# Patient Record
Sex: Female | Born: 1990 | Race: Black or African American | Hispanic: No | Marital: Single | Smoking: Never smoker
Health system: Southern US, Community
[De-identification: ages and names within clinical notes are randomized; demographics above are authoritative.]

## PROBLEM LIST (undated history)

## (undated) ENCOUNTER — Inpatient Hospital Stay (HOSPITAL_COMMUNITY): Payer: Self-pay

## (undated) DIAGNOSIS — I10 Essential (primary) hypertension: Secondary | ICD-10-CM

## (undated) HISTORY — DX: Essential (primary) hypertension: I10

---
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
## 2012-05-27 ENCOUNTER — Encounter (HOSPITAL_COMMUNITY): Payer: Self-pay

## 2012-05-27 ENCOUNTER — Emergency Department (INDEPENDENT_AMBULATORY_CARE_PROVIDER_SITE_OTHER)
Admission: EM | Admit: 2012-05-27 | Discharge: 2012-05-27 | Disposition: A | Discharge status: Home / Self Care | Payer: Medicaid Other | Source: Home / Self Care | Attending: Family Medicine | Admitting: Family Medicine

## 2012-05-27 DIAGNOSIS — S8000XA Contusion of unspecified knee, initial encounter: Secondary | ICD-10-CM

## 2012-05-27 DIAGNOSIS — S8001XA Contusion of right knee, initial encounter: Secondary | ICD-10-CM

## 2012-05-27 MED ORDER — IBUPROFEN 600 MG PO TABS: 600.0000 mg | ORAL_TABLET | Freq: Three times a day (TID) | ORAL | Status: DC

## 2012-05-27 NOTE — ED Provider Notes (Signed)
History     CSN: [id number]  Arrival date & time 05/27/12  1224   First MD Initiated Contact with Patient 05/27/12 1230      Chief Complaint  Patient presents with  . Knee Injury    (Consider location/radiation/quality/duration/timing/severity/associated sxs/prior treatment) HPI Comments: 21 year old female with history of asthma. Here complaining of right knee pain. Patient described as she hit her right knee when her car door closed while she still had her leg outside, incident occured yesterday. No skin cuts. No bruising. Pain only with touching the tender area and with extreme flexion. Denies knee pain with walking or putting weight on her right lower extremity.   Past Medical History  Diagnosis Date  . Asthma   . Allergic rhinitis     History reviewed. No pertinent past surgical history.  Family History  Problem Relation Age of Onset  . Asthma Mother   . Allergic rhinitis Mother     History  Substance Use Topics  . Smoking status: Never Smoker   . Smokeless tobacco: Not on file  . Alcohol Use: No    OB History    Grav Para Term Preterm Abortions TAB SAB Ect Mult Living                  Review of Systems  Constitutional:       10 systems reviewed and  pertinent negative and positive symptoms are as per HPI.     Musculoskeletal: Positive for joint swelling, arthralgias and gait problem.       As per HPI  Skin: Negative for color change and wound.  Neurological: Negative for weakness and numbness.  All other systems reviewed and are negative.    Allergies  Amoxicillin  Home Medications   Current Outpatient Rx  Name Route Sig Dispense Refill  . ALBUTEROL SULFATE HFA 108 (90 BASE) MCG/ACT IN AERS Inhalation Inhale 2 puffs into the lungs every 6 (six) hours as needed.      . ALBUTEROL SULFATE (2.5 MG/3ML) 0.083% IN NEBU Nebulization Take 2.5 mg by nebulization every 6 (six) hours as needed.      . BUDESONIDE-FORMOTEROL FUMARATE 160-4.5 MCG/ACT IN AERO  Inhalation Inhale 2 puffs into the lungs 2 (two) times daily. 1 Inhaler 5  . CETIRIZINE HCL 10 MG PO TABS Oral Take 10 mg by mouth daily.    Marland Kitchen MONTELUKAST SODIUM 10 MG PO TABS Oral Take 10 mg by mouth at bedtime.      . IBUPROFEN 600 MG PO TABS Oral Take 1 tablet (600 mg total) by mouth 3 (three) times daily. 21 tablet 0    BP 128/68  Pulse 76  Temp 97.9 F (36.6 C) (Oral)  Resp 16  SpO2 100%  LMP 05/25/2012  Physical Exam  Nursing note and vitals reviewed. Constitutional: She is oriented to person, place, and time. She appears well-developed and well-nourished. No distress.  HENT:  Head: Normocephalic and atraumatic.  Cardiovascular: Regular rhythm.   Pulmonary/Chest: Breath sounds normal.  Musculoskeletal:       Right knee: no obvious deformity. No bruising. No erythema or obvious swelling. There is reported tenderness to palpation in lateral area. Patella is well aligned with no crepitus or tenderness to palpation. FROM but reported pain with active flexion more than extension. No hematomas or fluctuations. No effusion. Weight bearing.  Entire right lower extremity is neurovascularly intact.  Neurological: She is alert and oriented to person, place, and time.  Skin:  No skin abrasions, hematomas, ecchymosis or lacerations.     ED Course  Procedures (including critical care time)  Labs Reviewed - No data to display No results found.   1. Contusion of right knee       MDM  No hematoma or effusion on exam. Treated symptomatically with a knee sleeve and ibuprofen. Asked to use ice today. Return if persistent or worsening symptoms despite following treatment for 5-7 days.        Sharin Grave, MD 05/30/12 0110

## 2012-05-27 NOTE — ED Notes (Signed)
Patient states that a door closed on her right knee yesterday.

## 2012-06-23 ENCOUNTER — Encounter (HOSPITAL_COMMUNITY): Payer: Self-pay | Admitting: *Deleted

## 2012-06-23 ENCOUNTER — Emergency Department (INDEPENDENT_AMBULATORY_CARE_PROVIDER_SITE_OTHER)
Admission: EM | Admit: 2012-06-23 | Discharge: 2012-06-23 | Disposition: A | Discharge status: Home / Self Care | Payer: Medicaid Other | Source: Home / Self Care | Attending: Family Medicine | Admitting: Family Medicine

## 2012-06-23 DIAGNOSIS — J40 Bronchitis, not specified as acute or chronic: Secondary | ICD-10-CM

## 2012-06-23 DIAGNOSIS — R05 Cough: Secondary | ICD-10-CM

## 2012-06-23 MED ORDER — ALBUTEROL SULFATE HFA 108 (90 BASE) MCG/ACT IN AERS: 2.0000 | INHALATION_SPRAY | Freq: Four times a day (QID) | RESPIRATORY_TRACT | Status: DC | PRN

## 2012-06-23 MED ORDER — PREDNISONE 20 MG PO TABS
ORAL_TABLET | ORAL | Status: AC
  Filled 2012-06-23: qty 3

## 2012-06-23 MED ORDER — ALBUTEROL SULFATE (5 MG/ML) 0.5% IN NEBU
2.5000 mg | INHALATION_SOLUTION | Freq: Once | RESPIRATORY_TRACT | Status: AC
  Administered 2012-06-23: 2.5 mg via RESPIRATORY_TRACT

## 2012-06-23 MED ORDER — GUAIFENESIN-CODEINE 100-10 MG/5ML PO SYRP: 5.0000 mL | ORAL_SOLUTION | Freq: Three times a day (TID) | ORAL | Status: DC | PRN

## 2012-06-23 MED ORDER — IPRATROPIUM BROMIDE 0.02 % IN SOLN
0.5000 mg | Freq: Once | RESPIRATORY_TRACT | Status: AC
  Administered 2012-06-23: 0.5 mg via RESPIRATORY_TRACT

## 2012-06-23 MED ORDER — METHYLPREDNISOLONE 4 MG PO KIT: PACK | ORAL | Status: DC

## 2012-06-23 MED ORDER — ALBUTEROL SULFATE (5 MG/ML) 0.5% IN NEBU
INHALATION_SOLUTION | RESPIRATORY_TRACT | Status: AC
  Filled 2012-06-23: qty 1

## 2012-06-23 MED ORDER — CETIRIZINE HCL 10 MG PO TABS: 10.0000 mg | ORAL_TABLET | Freq: Every day | ORAL | Status: DC

## 2012-06-23 MED ORDER — PREDNISONE 20 MG PO TABS
60.0000 mg | ORAL_TABLET | Freq: Every day | ORAL | Status: DC
  Administered 2012-06-23: 60 mg via ORAL

## 2012-06-23 NOTE — ED Provider Notes (Signed)
History     CSN: [id number]  Arrival date & time 06/23/12  4098   First MD Initiated Contact with Patient 06/23/12 1118      Chief Complaint  Patient presents with  . Cough  . URI    (Consider location/radiation/quality/duration/timing/severity/associated sxs/prior treatment) Patient is a 21 y.o. female presenting with cough and URI. The history is provided by the patient.  Cough This is a recurrent problem. The current episode started 2 days ago. The problem occurs hourly. The problem has been gradually worsening. The cough is productive of sputum. There has been no fever. Associated symptoms include chest pain, shortness of breath and wheezing. Pertinent negatives include no chills, no sweats, no rhinorrhea, no sore throat and no myalgias. Treatments tried: proventil, qvar, cough syrup. The treatment provided no relief. Risk factors: none. She is not a smoker. Her past medical history is significant for bronchitis, emphysema and asthma. Her past medical history does not include pneumonia, bronchiectasis or COPD.  URI The primary symptoms include cough and wheezing. Primary symptoms do not include sore throat or myalgias.  The patient's medical history is significant for asthma. The patient's medical history does not include COPD.  The illness is not associated with chills or rhinorrhea.    Past Medical History  Diagnosis Date  . Asthma   . Allergic rhinitis     History reviewed. No pertinent past surgical history.  Family History  Problem Relation Age of Onset  . Asthma Mother   . Allergic rhinitis Mother     History  Substance Use Topics  . Smoking status: Never Smoker   . Smokeless tobacco: Not on file  . Alcohol Use: No    OB History    Grav Para Term Preterm Abortions TAB SAB Ect Mult Living                  Review of Systems  Constitutional: Negative for chills.  HENT: Negative for sore throat and rhinorrhea.   Respiratory: Positive for cough, shortness  of breath and wheezing.   Cardiovascular: Positive for chest pain.  Musculoskeletal: Negative for myalgias.  All other systems reviewed and are negative.    Allergies  Amoxicillin  Home Medications   Current Outpatient Rx  Name Route Sig Dispense Refill  . ALBUTEROL SULFATE HFA 108 (90 BASE) MCG/ACT IN AERS Inhalation Inhale 2 puffs into the lungs every 6 (six) hours as needed. 1 Inhaler 2  . BUDESONIDE-FORMOTEROL FUMARATE 160-4.5 MCG/ACT IN AERO Inhalation Inhale 2 puffs into the lungs 2 (two) times daily. 1 Inhaler 5  . CETIRIZINE HCL 10 MG PO TABS Oral Take 1 tablet (10 mg total) by mouth daily. 30 tablet 3  . GUAIFENESIN-CODEINE 100-10 MG/5ML PO SYRP Oral Take 5 mLs by mouth 3 (three) times daily as needed for cough. 120 mL 0  . IBUPROFEN 600 MG PO TABS Oral Take 1 tablet (600 mg total) by mouth 3 (three) times daily. 21 tablet 0  . METHYLPREDNISOLONE 4 MG PO KIT  Take as directed 21 tablet 0  . MONTELUKAST SODIUM 10 MG PO TABS Oral Take 10 mg by mouth at bedtime.        BP 119/73  Pulse 109  Temp 97.8 F (36.6 C) (Oral)  Resp 20  SpO2 93%  LMP 05/25/2012  Physical Exam  Nursing note and vitals reviewed. Constitutional: She is oriented to person, place, and time. Vital signs are normal. She appears well-developed and well-nourished. She is active and cooperative.  HENT:  Head: Normocephalic.  Right Ear: External ear normal.  Left Ear: External ear normal.  Nose: Rhinorrhea present.  Mouth/Throat: Uvula is midline. Posterior oropharyngeal erythema present. No oropharyngeal exudate or posterior oropharyngeal edema.  Eyes: Conjunctivae normal are normal. Pupils are equal, round, and reactive to light. No scleral icterus.  Neck: Trachea normal. Neck supple.  Cardiovascular: Normal rate and regular rhythm.   Pulmonary/Chest: Effort normal. She has wheezes. She has no rhonchi.  Neurological: She is alert and oriented to person, place, and time. No cranial nerve deficit or  sensory deficit.  Skin: Skin is warm and dry.  Psychiatric: She has a normal mood and affect. Her speech is normal and behavior is normal. Judgment and thought content normal. Cognition and memory are normal.    ED Course  Procedures (including critical care time)  Labs Reviewed - No data to display No results found.   1. Bronchitis   2. Cough       MDM  Albuterol/atrovent neb and prednisone in office, medication as prescribed.  Follow up with your pulmonologist as needed.        Johnsie Kindred, NP 06/23/12 1214

## 2012-06-23 NOTE — ED Notes (Signed)
Pt is here with complaints of cough with clear sputum, congestions and chest tightness.  Pt has history of asthma.

## 2012-06-24 NOTE — ED Provider Notes (Signed)
Medical screening examination/treatment/procedure(s) were performed by resident physician or non-physician practitioner and as supervising physician I was immediately available for consultation/collaboration.   KINDL,JAMES DOUGLAS MD.    James D Kindl, MD 06/24/12 2106 

## 2012-07-14 DIAGNOSIS — F411 Generalized anxiety disorder: Secondary | ICD-10-CM | POA: Diagnosis present

## 2014-06-13 ENCOUNTER — Emergency Department (HOSPITAL_COMMUNITY)
Admission: EM | Admit: 2014-06-13 | Discharge: 2014-06-14 | Disposition: A | Discharge status: Home / Self Care | Payer: Medicaid Other | Attending: Emergency Medicine | Admitting: Emergency Medicine

## 2014-06-13 ENCOUNTER — Encounter (HOSPITAL_COMMUNITY): Payer: Self-pay | Admitting: Emergency Medicine

## 2014-06-13 DIAGNOSIS — R1031 Right lower quadrant pain: Secondary | ICD-10-CM

## 2014-06-13 DIAGNOSIS — Z79899 Other long term (current) drug therapy: Secondary | ICD-10-CM | POA: Insufficient documentation

## 2014-06-13 DIAGNOSIS — J45909 Unspecified asthma, uncomplicated: Secondary | ICD-10-CM | POA: Insufficient documentation

## 2014-06-13 DIAGNOSIS — Z3202 Encounter for pregnancy test, result negative: Secondary | ICD-10-CM | POA: Diagnosis not present

## 2014-06-13 DIAGNOSIS — Z880 Allergy status to penicillin: Secondary | ICD-10-CM | POA: Diagnosis not present

## 2014-06-13 LAB — URINALYSIS, ROUTINE W REFLEX MICROSCOPIC
Bilirubin Urine: NEGATIVE
Glucose, UA: NEGATIVE mg/dL
Hgb urine dipstick: NEGATIVE
KETONES UR: 15 mg/dL — AB
LEUKOCYTES UA: NEGATIVE
NITRITE: NEGATIVE
PH: 7 (ref 5.0–8.0)
Protein, ur: NEGATIVE mg/dL
SPECIFIC GRAVITY, URINE: 1.012 (ref 1.005–1.030)
UROBILINOGEN UA: 1 mg/dL (ref 0.0–1.0)

## 2014-06-13 LAB — COMPREHENSIVE METABOLIC PANEL
ALBUMIN: 3.9 g/dL (ref 3.5–5.2)
ALT: 10 U/L (ref 0–35)
AST: 15 U/L (ref 0–37)
Alkaline Phosphatase: 94 U/L (ref 39–117)
Anion gap: 12 (ref 5–15)
BUN: 5 mg/dL — ABNORMAL LOW (ref 6–23)
CALCIUM: 9.4 mg/dL (ref 8.4–10.5)
CO2: 24 mEq/L (ref 19–32)
CREATININE: 0.8 mg/dL (ref 0.50–1.10)
Chloride: 101 mEq/L (ref 96–112)
GFR calc Af Amer: >90 mL/min (ref >90)
GFR calc non Af Amer: >90 mL/min (ref >90)
Glucose, Bld: 80 mg/dL (ref 70–99)
Potassium: 4.5 mEq/L (ref 3.7–5.3)
Sodium: 137 mEq/L (ref 137–147)
Total Bilirubin: <0.2 mg/dL — ABNORMAL LOW (ref 0.3–1.2)
Total Protein: 7.7 g/dL (ref 6.0–8.3)

## 2014-06-13 LAB — CBC WITH DIFFERENTIAL/PLATELET
BASOS ABS: 0.1 10*3/uL (ref 0.0–0.1)
BASOS PCT: 1 % (ref 0–1)
EOS ABS: 0.8 10*3/uL — AB (ref 0.0–0.7)
EOS PCT: 11 % — AB (ref 0–5)
HEMATOCRIT: 40.9 % (ref 36.0–46.0)
Hemoglobin: 13.3 g/dL (ref 12.0–15.0)
Lymphocytes Relative: 22 % (ref 12–46)
Lymphs Abs: 1.7 10*3/uL (ref 0.7–4.0)
MCH: 26.4 pg (ref 26.0–34.0)
MCHC: 32.5 g/dL (ref 30.0–36.0)
MCV: 81.2 fL (ref 78.0–100.0)
MONO ABS: 0.9 10*3/uL (ref 0.1–1.0)
Monocytes Relative: 12 % (ref 3–12)
Neutro Abs: 4.2 10*3/uL (ref 1.7–7.7)
Neutrophils Relative %: 54 % (ref 43–77)
Platelets: 332 10*3/uL (ref 150–400)
RBC: 5.04 MIL/uL (ref 3.87–5.11)
RDW: 13.9 % (ref 11.5–15.5)
WBC: 7.7 10*3/uL (ref 4.0–10.5)

## 2014-06-13 LAB — PREGNANCY, URINE: PREG TEST UR: NEGATIVE

## 2014-06-13 MED ORDER — SODIUM CHLORIDE 0.9 % IV BOLUS (SEPSIS)
1000.0000 mL | Freq: Once | INTRAVENOUS | Status: AC
  Administered 2014-06-14: 1000 mL via INTRAVENOUS

## 2014-06-13 MED ORDER — MORPHINE SULFATE 4 MG/ML IJ SOLN
4.0000 mg | Freq: Once | INTRAMUSCULAR | Status: AC
  Administered 2014-06-14: 4 mg via INTRAVENOUS
  Filled 2014-06-13: qty 1

## 2014-06-13 NOTE — ED Provider Notes (Signed)
CSN: [id number]     Arrival date & time 06/13/14  1903 History   First MD Initiated Contact with Patient 06/13/14 2337     Chief Complaint  Patient presents with  . Abdominal Pain     (Consider location/radiation/quality/duration/timing/severity/associated sxs/prior Treatment) HPI Comments: PT comes in with cc of abd pain. No hx of similar pain. Abd pain started this AM, and is located in the lower quadrants and radiating to the right back. Nausea earlier, none now, no emesis, fevers, chills. + cough, and her pain is worse with cough. Cough x 1 week, dry. No vaginal discharge, no vaginal bleeding, no uti like sx, no STD hx and no risk factors, no hx of fibroids or cyst.  Patient is a 23 y.o. female presenting with abdominal pain. The history is provided by the patient.  Abdominal Pain Associated symptoms: no chest pain, no dysuria, no nausea, no shortness of breath, no vaginal discharge and no vomiting     Past Medical History  Diagnosis Date  . Asthma   . Allergic rhinitis    History reviewed. No pertinent past surgical history. Family History  Problem Relation Age of Onset  . Asthma Mother   . Allergic rhinitis Mother    History  Substance Use Topics  . Smoking status: Never Smoker   . Smokeless tobacco: Not on file  . Alcohol Use: No   OB History   Grav Para Term Preterm Abortions TAB SAB Ect Mult Living                 Review of Systems  Constitutional: Negative for activity change.  Respiratory: Negative for shortness of breath.   Cardiovascular: Negative for chest pain.  Gastrointestinal: Positive for abdominal pain. Negative for nausea and vomiting.  Genitourinary: Negative for dysuria, frequency and vaginal discharge.  Musculoskeletal: Negative for neck pain.  Neurological: Negative for headaches.  All other systems reviewed and are negative.     Allergies  Amoxicillin  Home Medications   Prior to Admission medications   Medication Sig Start Date End  Date Taking? Authorizing Provider  albuterol (PROVENTIL HFA;VENTOLIN HFA) 108 (90 BASE) MCG/ACT inhaler Inhale 2 puffs into the lungs every 6 (six) hours as needed for shortness of breath. 06/23/12  Yes Johnsie Kindredarmen L Chatten, NP  cetirizine (ZYRTEC) 10 MG tablet Take 1 tablet (10 mg total) by mouth daily. 06/23/12  Yes Johnsie Kindredarmen L Chatten, NP  ibuprofen (ADVIL,MOTRIN) 600 MG tablet Take 1 tablet (600 mg total) by mouth 3 (three) times daily. 05/27/12  Yes Adlih Moreno-Coll, MD  montelukast (SINGULAIR) 10 MG tablet Take 10 mg by mouth daily.    Yes Historical Provider, MD  ibuprofen (ADVIL,MOTRIN) 600 MG tablet Take 1 tablet (600 mg total) by mouth every 6 (six) hours as needed. 06/14/14   Sharise Lippy Rhunette CroftNanavati, MD   BP 121/47  Pulse 94  Temp(Src) 98.1 F (36.7 C)  Resp 20  SpO2 100%  LMP 05/31/2014 Physical Exam  Nursing note and vitals reviewed. Constitutional: She is oriented to person, place, and time. She appears well-developed and well-nourished.  HENT:  Head: Normocephalic and atraumatic.  Eyes: Conjunctivae and EOM are normal. Pupils are equal, round, and reactive to light.  Neck: Normal range of motion. Neck supple.  Cardiovascular: Normal rate, regular rhythm, normal heart sounds and intact distal pulses.   No murmur heard. Pulmonary/Chest: Effort normal. No respiratory distress. She has no wheezes.  Abdominal: Soft. Bowel sounds are normal. She exhibits no distension. There is tenderness. There  is guarding. There is no rebound.  RLQ tenderness, over the mcburneys  Genitourinary: Vagina normal and uterus normal.  External exam - normal, no lesions Speculum exam: Pt has some white discharge, no blood Bimanual exam: Patient has no CMT, no adnexal tenderness or fullness and cervical os is closed  Neurological: She is alert and oriented to person, place, and time.  Skin: Skin is warm and dry.    ED Course  Procedures (including critical care time) Labs Review Labs Reviewed  WET PREP,  GENITAL - Abnormal; Notable for the following:    WBC, Wet Prep HPF POC FEW (*)    All other components within normal limits  CBC WITH DIFFERENTIAL - Abnormal; Notable for the following:    Eosinophils Relative 11 (*)    Eosinophils Absolute 0.8 (*)    All other components within normal limits  COMPREHENSIVE METABOLIC PANEL - Abnormal; Notable for the following:    BUN 5 (*)    Total Bilirubin <0.2 (*)    All other components within normal limits  URINALYSIS, ROUTINE W REFLEX MICROSCOPIC - Abnormal; Notable for the following:    Ketones, ur 15 (*)    All other components within normal limits  GC/CHLAMYDIA PROBE AMP  PREGNANCY, URINE    Imaging Review Dg Chest 2 View  06/14/2014   CLINICAL DATA:  Right lower abdominal pain. Chest pain. Initial encounter  EXAM: CHEST  2 VIEW  COMPARISON:  08/16/2011  FINDINGS: Normal heart size and mediastinal contours. No acute infiltrate or edema. No effusion or pneumothorax. No acute osseous findings.  IMPRESSION: No active cardiopulmonary disease.   Electronically Signed   By: Tiburcio Pea M.D.   On: 06/14/2014 02:33   Ct Abdomen Pelvis W Contrast  06/14/2014   CLINICAL DATA:  Lower abdominal and right low back pain. Onset this morning with nausea. Initial encounter.  EXAM: CT ABDOMEN AND PELVIS WITH CONTRAST  TECHNIQUE: Multidetector CT imaging of the abdomen and pelvis was performed using the standard protocol following bolus administration of intravenous contrast.  CONTRAST:  OMNIPAQUE IOHEXOL 300 MG/ML  SOLN  COMPARISON:  None.  FINDINGS: Normal hepatic contour. No discrete hepatic lesions. Normal appearance of the gallbladder. No radiopaque gallstones. No intra or extrahepatic biliary duct dilatation. No ascites.  There is symmetric enhancement of the bilateral kidneys. No discrete renal lesions. No definite renal stones on this postcontrast examination. The urinary obstruction perinephric stranding. Normal appearance of the bilateral adrenal  glands, pancreas and spleen.  Moderate colonic stool burden without evidence of enteric obstruction. The bowel is normal in course and caliber without wall thickening. Normal appearance of the appendix. No pneumoperitoneum, pneumatosis or portal venous gas.  Normal caliber of the abdominal aorta. The major branch vessels of the abdominal aorta appear widely patent on this non CTA examination. No retroperitoneal, mesenteric, pelvic or inguinal lymphadenopathy.  There is a minimal amount of presumably physiologic fluid within the endometrial canal. No discrete adnexal lesions. There is a trace amount of physiologic fluid within the pelvic cul-de-sac.  Normal appearance of the urinary bladder given degree distention  Limited visualization of lower thorax is degraded secondary to patient respiratory artifact. No discrete focal airspace opacities. No pleural effusion. Normal heart size. No pericardial effusion.  No acute or aggressive osseous abnormalities. Regional soft tissues appear normal.  IMPRESSION: 1. No explanation for patient's lower abdominal and right-sided back pain. Specifically, no evidence enteric or urinary obstruction. Normal appearance of the appendix. 2. Small amount of presumably physiologic  fluid within the endometrial canal and pelvic cul-de-sac. No discrete adnexal lesions.   Electronically Signed   By: Simonne ComeJohn  Watts M.D.   On: 06/14/2014 01:14     EKG Interpretation None      MDM   Final diagnoses:  RLQ abdominal pain    Pt comes in with abd pain. Pain started earlier today, and on exam pt has focal RLQ tenderness. She has a normal pelvic exam. No hx of STD, and no risk factors for the same. Pain is moving to the flank region, no uti like sx, no hx of renal stones or pyelo. UA is normal. CT abd ordered for the focal RLQ pain - and is neg. No large ovarian cyst seen. Not sure what the pain is from, and asked pt to see Gyne for further evaluation.   Derwood KaplanAnkit Nicholle Falzon, MD 06/14/14  [phone]

## 2014-06-13 NOTE — ED Notes (Signed)
Pt. reports low abdominal pain and right low back pain onset this morning with nausea , denies emesis or diarrhea , no fever or chills.

## 2014-06-14 ENCOUNTER — Emergency Department (HOSPITAL_COMMUNITY): Payer: Medicaid Other

## 2014-06-14 ENCOUNTER — Encounter (HOSPITAL_COMMUNITY): Payer: Self-pay | Admitting: Radiology

## 2014-06-14 LAB — WET PREP, GENITAL
Clue Cells Wet Prep HPF POC: NONE SEEN
Trich, Wet Prep: NONE SEEN
YEAST WET PREP: NONE SEEN

## 2014-06-14 MED ORDER — IBUPROFEN 600 MG PO TABS
600.0000 mg | ORAL_TABLET | Freq: Four times a day (QID) | ORAL | Status: DC | PRN
Start: 2014-06-14 — End: 2014-11-26

## 2014-06-14 MED ORDER — IOHEXOL 300 MG/ML  SOLN
100.0000 mL | Freq: Once | INTRAMUSCULAR | Status: AC | PRN
  Administered 2014-06-14: 100 mL via INTRAVENOUS

## 2014-06-14 NOTE — ED Notes (Signed)
MD at bedside. 

## 2014-06-14 NOTE — Discharge Instructions (Signed)
We saw you in the ER for the abdominal pain. All the results in the ER are normal, labs and imaging. We are not sure what is causing your symptoms. The workup in the ER is not complete, and is limited to screening for life threatening and emergent conditions only, so please see a Gynecologist for this pain.   Abdominal Pain, Women Abdominal (stomach, pelvic, or belly) pain can be caused by many things. It is important to tell your doctor:  The location of the pain.  Does it come and go or is it present all the time?  Are there things that start the pain (eating certain foods, exercise)?  Are there other symptoms associated with the pain (fever, nausea, vomiting, diarrhea)? All of this is helpful to know when trying to find the cause of the pain. CAUSES   Stomach: virus or bacteria infection, or ulcer.  Intestine: appendicitis (inflamed appendix), regional ileitis (Crohn's disease), ulcerative colitis (inflamed colon), irritable bowel syndrome, diverticulitis (inflamed diverticulum of the colon), or cancer of the stomach or intestine.  Gallbladder disease or stones in the gallbladder.  Kidney disease, kidney stones, or infection.  Pancreas infection or cancer.  Fibromyalgia (pain disorder).  Diseases of the female organs:  Uterus: fibroid (non-cancerous) tumors or infection.  Fallopian tubes: infection or tubal pregnancy.  Ovary: cysts or tumors.  Pelvic adhesions (scar tissue).  Endometriosis (uterus lining tissue growing in the pelvis and on the pelvic organs).  Pelvic congestion syndrome (female organs filling up with blood just before the menstrual period).  Pain with the menstrual period.  Pain with ovulation (producing an egg).  Pain with an IUD (intrauterine device, birth control) in the uterus.  Cancer of the female organs.  Functional pain (pain not caused by a disease, may improve without treatment).  Psychological pain.  Depression. DIAGNOSIS  Your  doctor will decide the seriousness of your pain by doing an examination.  Blood tests.  X-rays.  Ultrasound.  CT scan (computed tomography, special type of X-ray).  MRI (magnetic resonance imaging).  Cultures, for infection.  Barium enema (dye inserted in the large intestine, to better view it with X-rays).  Colonoscopy (looking in intestine with a lighted tube).  Laparoscopy (minor surgery, looking in abdomen with a lighted tube).  Major abdominal exploratory surgery (looking in abdomen with a large incision). TREATMENT  The treatment will depend on the cause of the pain.   Many cases can be observed and treated at home.  Over-the-counter medicines recommended by your caregiver.  Prescription medicine.  Antibiotics, for infection.  Birth control pills, for painful periods or for ovulation pain.  Hormone treatment, for endometriosis.  Nerve blocking injections.  Physical therapy.  Antidepressants.  Counseling with a psychologist or psychiatrist.  Minor or major surgery. HOME CARE INSTRUCTIONS   Do not take laxatives, unless directed by your caregiver.  Take over-the-counter pain medicine only if ordered by your caregiver. Do not take aspirin because it can cause an upset stomach or bleeding.  Try a clear liquid diet (broth or water) as ordered by your caregiver. Slowly move to a bland diet, as tolerated, if the pain is related to the stomach or intestine.  Have a thermometer and take your temperature several times a day, and record it.  Bed rest and sleep, if it helps the pain.  Avoid sexual intercourse, if it causes pain.  Avoid stressful situations.  Keep your follow-up appointments and tests, as your caregiver orders.  If the pain does not go  away with medicine or surgery, you may try:  Acupuncture.  Relaxation exercises (yoga, meditation).  Group therapy.  Counseling. SEEK MEDICAL CARE IF:   You notice certain foods cause stomach  pain.  Your home care treatment is not helping your pain.  You need stronger pain medicine.  You want your IUD removed.  You feel faint or lightheaded.  You develop nausea and vomiting.  You develop a rash.  You are having side effects or an allergy to your medicine. SEEK IMMEDIATE MEDICAL CARE IF:   Your pain does not go away or gets worse.  You have a fever.  Your pain is felt only in portions of the abdomen. The right side could possibly be appendicitis. The left lower portion of the abdomen could be colitis or diverticulitis.  You are passing blood in your stools (bright red or black tarry stools, with or without vomiting).  You have blood in your urine.  You develop chills, with or without a fever.  You pass out. MAKE SURE YOU:   Understand these instructions.  Will watch your condition.  Will get help right away if you are not doing well or get worse. Document Released: 06/16/2007 Document Revised: 01/03/2014 Document Reviewed: 07/06/2009 [facility] Patient Information 2015 [employer], [place]. This information is not intended to replace advice given to you by your health care provider. Make sure you discuss any questions you have with your health care provider.  Flank Pain Flank pain refers to pain that is located on the side of the body between the upper abdomen and the back. The pain may occur over a short period of time (acute) or may be long-term or reoccurring (chronic). It may be mild or severe. Flank pain can be caused by many things. CAUSES  Some of the more common causes of flank pain include:  Muscle strains.   Muscle spasms.   A disease of your spine (vertebral disk disease).   A lung infection (pneumonia).   Fluid around your lungs (pulmonary edema).   A kidney infection.   Kidney stones.   A very painful skin rash caused by the chickenpox virus (shingles).   Gallbladder disease.  HOME CARE INSTRUCTIONS  Home care will depend on the  cause of your pain. In general,  Rest as directed by your caregiver.  Drink enough fluids to keep your urine clear or pale yellow.  Only take over-the-counter or prescription medicines as directed by your caregiver. Some medicines may help relieve the pain.  Tell your caregiver about any changes in your pain.  Follow up with your caregiver as directed. SEEK IMMEDIATE MEDICAL CARE IF:   Your pain is not controlled with medicine.   You have new or worsening symptoms.  Your pain increases.   You have abdominal pain.   You have shortness of breath.   You have persistent nausea or vomiting.   You have swelling in your abdomen.   You feel faint or pass out.   You have blood in your urine.  You have a fever or persistent symptoms for more than 2-3 days.  You have a fever and your symptoms suddenly get worse. MAKE SURE YOU:   Understand these instructions.  Will watch your condition.  Will get help right away if you are not doing well or get worse. Document Released: 10/10/2005 Document Revised: 05/13/2012 Document Reviewed: 04/02/2012 [facility] Patient Information 2015 [employer], [place]. This information is not intended to replace advice given to you by your health care provider. Make  sure you discuss any questions you have with your health care provider.

## 2014-06-15 LAB — GC/CHLAMYDIA PROBE AMP
CT Probe RNA: POSITIVE — AB
GC PROBE AMP APTIMA: NEGATIVE

## 2014-06-20 ENCOUNTER — Telehealth (HOSPITAL_COMMUNITY): Payer: Self-pay

## 2014-07-13 ENCOUNTER — Encounter (HOSPITAL_COMMUNITY): Payer: Self-pay | Admitting: *Deleted

## 2014-07-13 ENCOUNTER — Emergency Department (INDEPENDENT_AMBULATORY_CARE_PROVIDER_SITE_OTHER)
Admission: EM | Admit: 2014-07-13 | Discharge: 2014-07-13 | Disposition: A | Discharge status: Home / Self Care | Payer: Medicaid Other | Source: Home / Self Care | Attending: Emergency Medicine | Admitting: Emergency Medicine

## 2014-07-13 DIAGNOSIS — J069 Acute upper respiratory infection, unspecified: Secondary | ICD-10-CM | POA: Diagnosis not present

## 2014-07-13 DIAGNOSIS — B9789 Other viral agents as the cause of diseases classified elsewhere: Principal | ICD-10-CM

## 2014-07-13 MED ORDER — IPRATROPIUM-ALBUTEROL 0.5-2.5 (3) MG/3ML IN SOLN
RESPIRATORY_TRACT | Status: AC
Start: 2014-07-13 — End: 2014-07-13
  Filled 2014-07-13: qty 3

## 2014-07-13 MED ORDER — CETIRIZINE HCL 10 MG PO TABS: 10.0000 mg | ORAL_TABLET | Freq: Every day | ORAL | Status: DC

## 2014-07-13 MED ORDER — IPRATROPIUM-ALBUTEROL 0.5-2.5 (3) MG/3ML IN SOLN
3.0000 mL | Freq: Once | RESPIRATORY_TRACT | Status: AC
  Administered 2014-07-13: 3 mL via RESPIRATORY_TRACT

## 2014-07-13 MED ORDER — BENZONATATE 100 MG PO CAPS: 100.0000 mg | ORAL_CAPSULE | Freq: Two times a day (BID) | ORAL | Status: DC | PRN

## 2014-07-13 NOTE — Discharge Instructions (Signed)
You have a viral cold. Restart the Zyrtec daily. Use your albuterol as needed for cough and wheezing. Take Mucinex twice a day to help with the congestion. Take Tessalon every 12 hours as needed for cough. You can also take honey for the cough.  Follow-up with your regular doctor if your symptoms do not improve in 1 week.

## 2014-07-13 NOTE — ED Notes (Signed)
Pt  Has  Symptoms  Of  Cough   /  Congested       Hurts to  Breath       Since  Last  Week     -  Symptoms  Getting  Worse        Pt has  Asthma    Pt is wheezing        Symptoms  Not releived by her  meds

## 2014-07-13 NOTE — ED Provider Notes (Signed)
CSN: [id number]     Arrival date & time 07/13/14  1305 History   First MD Initiated Contact with Patient 07/13/14 1319     Chief Complaint  Patient presents with  . Cough   (Consider location/radiation/quality/duration/timing/severity/associated sxs/prior Treatment) HPI She is a 23 year old woman here for evaluation of cough and congestion. Her symptoms started about a week ago and include nasal congestion, rhinorrhea, cough, headache. She denies any fevers or chills. No nausea or vomiting. No ear pain. She does have a history of asthma and reports some chest tightness and wheezing. She last used her albuterol this morning at 11 AM. She states she just completed a course of prednisone.  She has been taking her Singulair. She states she ran out of Zyrtec about 2 weeks ago. She works in a nursing home.  Past Medical History  Diagnosis Date  . Asthma   . Allergic rhinitis    History reviewed. No pertinent past surgical history. Family History  Problem Relation Age of Onset  . Asthma Mother   . Allergic rhinitis Mother    History  Substance Use Topics  . Smoking status: Never Smoker   . Smokeless tobacco: Not on file  . Alcohol Use: No   OB History    No data available     Review of Systems  Constitutional: Negative for fever and chills.  HENT: Positive for congestion and rhinorrhea. Negative for sinus pressure, sore throat and trouble swallowing.   Respiratory: Positive for cough, chest tightness and wheezing. Negative for shortness of breath.   Cardiovascular: Negative for chest pain.  Gastrointestinal: Negative for nausea, vomiting and diarrhea.  Musculoskeletal: Negative for myalgias.  Neurological: Positive for headaches.    Allergies  Amoxicillin  Home Medications   Prior to Admission medications   Medication Sig Start Date End Date Taking? Authorizing Provider  albuterol (PROVENTIL HFA;VENTOLIN HFA) 108 (90 BASE) MCG/ACT inhaler Inhale 2 puffs into the lungs every  6 (six) hours as needed for shortness of breath. 06/23/12   Johnsie Kindredarmen L Chatten, NP  cetirizine (ZYRTEC) 10 MG tablet Take 1 tablet (10 mg total) by mouth daily. 06/23/12   Johnsie Kindredarmen L Chatten, NP  ibuprofen (ADVIL,MOTRIN) 600 MG tablet Take 1 tablet (600 mg total) by mouth 3 (three) times daily. 05/27/12   Adlih Moreno-Coll, MD  ibuprofen (ADVIL,MOTRIN) 600 MG tablet Take 1 tablet (600 mg total) by mouth every 6 (six) hours as needed. 06/14/14   Derwood KaplanAnkit Nanavati, MD  montelukast (SINGULAIR) 10 MG tablet Take 10 mg by mouth daily.     Historical Provider, MD   BP 148/80 mmHg  Pulse 110  Temp(Src) 98.6 F (37 C) (Oral)  Resp 24  SpO2 93%  LMP 06/22/2014 Physical Exam  Constitutional: She is oriented to person, place, and time. She appears well-developed and well-nourished. No distress.  HENT:  Head: Normocephalic and atraumatic.  Right Ear: Tympanic membrane and external ear normal.  Left Ear: Tympanic membrane and external ear normal.  Nose: Mucosal edema present. No rhinorrhea. Right sinus exhibits no maxillary sinus tenderness and no frontal sinus tenderness. Left sinus exhibits no maxillary sinus tenderness and no frontal sinus tenderness.  Mouth/Throat: Oropharynx is clear and moist. No oropharyngeal exudate.  Mild cobblestoning  Neck: Neck supple.  Cardiovascular: Normal rate, regular rhythm and normal heart sounds.   No murmur heard. Pulmonary/Chest: Effort normal. No respiratory distress (mild, scattered). She has wheezes. She has no rales.  Lymphadenopathy:    She has no cervical adenopathy.  Neurological: She  is alert and oriented to person, place, and time.    ED Course  Procedures (including critical care time) Labs Review Labs Reviewed - No data to display  Imaging Review No results found.   MDM  No diagnosis found. Will give DuoNeb here.  Lungs are clear and she reports feeling much better after the breathing treatment.  Symptomatic treatment with Zyrtec and  Mucinex. Tessalon and honey as needed for cough. Follow-up as needed.    Charm RingsErin J Claiborne Stroble, MD 07/13/14 [phone]

## 2014-11-26 ENCOUNTER — Emergency Department (INDEPENDENT_AMBULATORY_CARE_PROVIDER_SITE_OTHER)
Admission: EM | Admit: 2014-11-26 | Discharge: 2014-11-26 | Disposition: A | Discharge status: Home / Self Care | Payer: Medicaid Other | Source: Home / Self Care | Attending: Emergency Medicine | Admitting: Emergency Medicine

## 2014-11-26 ENCOUNTER — Encounter (HOSPITAL_COMMUNITY): Payer: Self-pay | Admitting: Emergency Medicine

## 2014-11-26 DIAGNOSIS — T148 Other injury of unspecified body region: Secondary | ICD-10-CM

## 2014-11-26 DIAGNOSIS — T148XXA Other injury of unspecified body region, initial encounter: Secondary | ICD-10-CM

## 2014-11-26 NOTE — Discharge Instructions (Signed)

## 2014-11-26 NOTE — ED Notes (Signed)
C/o  Right flank pain.  Pain is more severe with coughing.  Denies injury or any urinary symptoms.  No otc meds tried.

## 2014-11-26 NOTE — ED Provider Notes (Signed)
CSN: [id number]     Arrival date & time 11/26/14  1521 History   First MD Initiated Contact with Patient 11/26/14 1620     Chief Complaint  Patient presents with  . Flank Pain   (Consider location/radiation/quality/duration/timing/severity/associated sxs/prior Treatment) HPI Comments: 24 year old female complaining of "right side pain "for over 3 months. She states the pain is elicited with coughing. Patient points to the right iliac crest as the source of pain. She denies shortness of breath or increasing cough.  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 Denies any known single event or trauma.    Past Medical History  Diagnosis Date  . Asthma   . Allergic rhinitis    History reviewed. No pertinent past surgical history. Family History  Problem Relation Age of Onset  . Asthma Mother   . Allergic rhinitis Mother    History  Substance Use Topics  . Smoking status: Never Smoker   . Smokeless tobacco: Not on file  . Alcohol Use: No   OB History    No data available     Review of Systems  Constitutional: Negative for fever, activity change and fatigue.  Respiratory: Negative.   Cardiovascular: Negative.   Gastrointestinal: Negative.   Musculoskeletal: Negative for back pain and neck pain.       As per history of present illness  Skin: Negative.   Neurological: Negative.     Allergies  Amoxicillin  Home Medications   Prior to Admission medications   Medication Sig Start Date End Date Taking? Authorizing Provider  albuterol (PROVENTIL HFA;VENTOLIN HFA) 108 (90 BASE) MCG/ACT inhaler Inhale 2 puffs into the lungs every 6 (six) hours as needed for shortness of breath. 06/23/12  Yes Johnsie Kindredarmen L Chatten, NP  benzonatate (TESSALON) 100 MG capsule Take 1 capsule (100 mg total) by mouth 2 (two) times daily as needed for cough. 07/13/14   Charm RingsErin J Honig, MD  cetirizine (ZYRTEC) 10 MG tablet Take 1 tablet (10 mg total) by mouth  daily. 07/13/14   Charm RingsErin J Honig, MD  ibuprofen (ADVIL,MOTRIN) 600 MG tablet Take 1 tablet (600 mg total) by mouth 3 (three) times daily. 05/27/12   Adlih Moreno-Coll, MD  ibuprofen (ADVIL,MOTRIN) 600 MG tablet Take 1 tablet (600 mg total) by mouth every 6 (six) hours as needed. 06/14/14   Derwood KaplanAnkit Nanavati, MD  montelukast (SINGULAIR) 10 MG tablet Take 10 mg by mouth daily.     Historical Provider, MD   BP 130/78 mmHg  Pulse 95  Temp(Src) 98 F (36.7 C) (Oral)  Resp 16  SpO2 100%  LMP 11/20/2014 Physical Exam  Constitutional: She is oriented to person, place, and time. She appears well-developed and well-nourished. No distress.  Neck: Neck supple.  Cardiovascular: Normal rate.   Pulmonary/Chest: Effort normal. No respiratory distress. She has no wheezes. She has no rales.  Abdominal: Soft. She exhibits no distension and no mass. There is no tenderness. There is no rebound and no guarding.  Musculoskeletal: She exhibits tenderness. She exhibits no edema.  There is tenderness directly over the anterior iliac crest. The patient states that palpation of this area reproduces the same pain for which she presents. Straight leg raises do not produce pain. Passive internal rotation of the hip reproduces pain. There is minor tenderness approximately one centimeters. Her to this area of the iliac crest. Distal neurovascular motor sensory is intact. Full range of motion of hip.  Neurological: She is alert and  oriented to person, place, and time.  Skin: Skin is warm and dry.  Psychiatric: She has a normal mood and affect.  Nursing note and vitals reviewed.   ED Course  Procedures (including critical care time) Labs Review Labs Reviewed - No data to display  Imaging Review No results found.   MDM   1. Muscle strain    The point of tenderness may represent areas of attachment for the transverse abdominis, iliac as or internal oblique. There is no rib tenderness or pain. Local heat  application Limit activity that exacerbates pain Ibuprofen 600 mg every 6-8 hours when necessary pain Follow-up with your primary care provider.      Hayden Rasmussen, NP 11/26/14 [phone]

## 2014-12-19 ENCOUNTER — Encounter (HOSPITAL_COMMUNITY): Payer: Self-pay | Admitting: Emergency Medicine

## 2014-12-19 ENCOUNTER — Emergency Department (HOSPITAL_COMMUNITY): Payer: No Typology Code available for payment source

## 2014-12-19 DIAGNOSIS — Y998 Other external cause status: Secondary | ICD-10-CM | POA: Diagnosis not present

## 2014-12-19 DIAGNOSIS — Y9389 Activity, other specified: Secondary | ICD-10-CM | POA: Diagnosis not present

## 2014-12-19 DIAGNOSIS — Z79899 Other long term (current) drug therapy: Secondary | ICD-10-CM | POA: Diagnosis not present

## 2014-12-19 DIAGNOSIS — S20212A Contusion of left front wall of thorax, initial encounter: Secondary | ICD-10-CM | POA: Insufficient documentation

## 2014-12-19 DIAGNOSIS — J45909 Unspecified asthma, uncomplicated: Secondary | ICD-10-CM | POA: Diagnosis not present

## 2014-12-19 DIAGNOSIS — S161XXA Strain of muscle, fascia and tendon at neck level, initial encounter: Secondary | ICD-10-CM | POA: Diagnosis not present

## 2014-12-19 DIAGNOSIS — Z880 Allergy status to penicillin: Secondary | ICD-10-CM | POA: Diagnosis not present

## 2014-12-19 DIAGNOSIS — S199XXA Unspecified injury of neck, initial encounter: Secondary | ICD-10-CM | POA: Diagnosis present

## 2014-12-19 DIAGNOSIS — Y92410 Unspecified street and highway as the place of occurrence of the external cause: Secondary | ICD-10-CM | POA: Diagnosis not present
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
## 2014-12-20 ENCOUNTER — Emergency Department (HOSPITAL_COMMUNITY)
Admission: EM | Admit: 2014-12-20 | Discharge: 2014-12-20 | Disposition: A | Discharge status: Home / Self Care | Payer: No Typology Code available for payment source | Attending: Emergency Medicine | Admitting: Emergency Medicine

## 2014-12-20 DIAGNOSIS — S161XXA Strain of muscle, fascia and tendon at neck level, initial encounter: Secondary | ICD-10-CM

## 2014-12-20 DIAGNOSIS — S20212A Contusion of left front wall of thorax, initial encounter: Secondary | ICD-10-CM

## 2014-12-20 MED ORDER — OXYCODONE-ACETAMINOPHEN 5-325 MG PO TABS
1.0000 | ORAL_TABLET | Freq: Once | ORAL | Status: AC
  Administered 2014-12-20: 1 via ORAL
  Filled 2014-12-20: qty 1

## 2014-12-20 MED ORDER — OXYCODONE-ACETAMINOPHEN 5-325 MG PO TABS: 1.0000 | ORAL_TABLET | Freq: Three times a day (TID) | ORAL | Status: DC | PRN

## 2014-12-20 MED ORDER — IBUPROFEN 400 MG PO TABS
600.0000 mg | ORAL_TABLET | Freq: Once | ORAL | Status: AC
  Administered 2014-12-20: 600 mg via ORAL
  Filled 2014-12-20 (×2): qty 1

## 2014-12-20 MED ORDER — IBUPROFEN 600 MG PO TABS: 600.0000 mg | ORAL_TABLET | Freq: Three times a day (TID) | ORAL | Status: DC

## 2014-12-20 MED ORDER — CYCLOBENZAPRINE HCL 5 MG PO TABS: 5.0000 mg | ORAL_TABLET | Freq: Three times a day (TID) | ORAL | Status: DC | PRN
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
## 2014-12-20 NOTE — Discharge Instructions (Signed)
Cervical Sprain °A cervical sprain is an injury in the neck in which the strong, fibrous tissues (ligaments) that connect your neck bones stretch or tear. Cervical sprains can range from mild to severe. Severe cervical sprains can cause the neck vertebrae to be unstable. This can lead to damage of the spinal cord and can result in serious nervous system problems. The amount of time it takes for a cervical sprain to get better depends on the cause and extent of the injury. Most cervical sprains heal in 1 to 3 weeks. °CAUSES  °Severe cervical sprains may be caused by:  °· Contact sport injuries (such as from football, rugby, wrestling, hockey, auto racing, gymnastics, diving, martial arts, or boxing).   °· Motor vehicle collisions.   °· Whiplash injuries. This is an injury from a sudden forward and backward whipping movement of the head and neck.  °· Falls.   °Mild cervical sprains may be caused by:  °· Being in an awkward position, such as while cradling a telephone between your ear and shoulder.   °· Sitting in a chair that does not offer proper support.   °· Working at a poorly designed computer station.   °· Looking up or down for long periods of time.   °SYMPTOMS  °· Pain, soreness, stiffness, or a burning sensation in the front, back, or sides of the neck. This discomfort may develop immediately after the injury or slowly, 24 hours or more after the injury.   °· Pain or tenderness directly in the middle of the back of the neck.   °· Shoulder or upper back pain.   °· Limited ability to move the neck.   °· Headache.   °· Dizziness.   °· Weakness, numbness, or tingling in the hands or arms.   °· Muscle spasms.   °· Difficulty swallowing or chewing.   °· Tenderness and swelling of the neck.   °DIAGNOSIS  °Most of the time your health care provider can diagnose a cervical sprain by taking your history and doing a physical exam. Your health care provider will ask about previous neck injuries and any known neck  problems, such as arthritis in the neck. X-rays may be taken to find out if there are any other problems, such as with the bones of the neck. Other tests, such as a CT scan or MRI, may also be needed.  °TREATMENT  °Treatment depends on the severity of the cervical sprain. Mild sprains can be treated with rest, keeping the neck in place (immobilization), and pain medicines. Severe cervical sprains are immediately immobilized. Further treatment is done to help with pain, muscle spasms, and other symptoms and may include: °· Medicines, such as pain relievers, numbing medicines, or muscle relaxants.   °· Physical therapy. This may involve stretching exercises, strengthening exercises, and posture training. Exercises and improved posture can help stabilize the neck, strengthen muscles, and help stop symptoms from returning.   °HOME CARE INSTRUCTIONS  °· Put ice on the injured area.   °¨ Put ice in a plastic bag.   °¨ Place a towel between your skin and the bag.   °¨ Leave the ice on for 15-20 minutes, 3-4 times a day.   °· If your injury was severe, you may have been given a cervical collar to wear. A cervical collar is a two-piece collar designed to keep your neck from moving while it heals. °¨ Do not remove the collar unless instructed by your health care provider. °¨ If you have long hair, keep it outside of the collar. °¨ Ask your health care provider before making any adjustments to your collar. Minor   adjustments may be required over time to improve comfort and reduce pressure on your chin or on the back of your head.  Ifyou are allowed to remove the collar for cleaning or bathing, follow your health care provider's instructions on how to do so safely.  Keep your collar clean by wiping it with mild soap and water and drying it completely. If the collar you have been given includes removable pads, remove them every 1-2 days and hand wash them with soap and water. Allow them to air dry. They should be completely  dry before you wear them in the collar.  If you are allowed to remove the collar for cleaning and bathing, wash and dry the skin of your neck. Check your skin for irritation or sores. If you see any, tell your health care provider.  Do not drive while wearing the collar.   Only take over-the-counter or prescription medicines for pain, discomfort, or fever as directed by your health care provider.   Keep all follow-up appointments as directed by your health care provider.   Keep all physical therapy appointments as directed by your health care provider.   Make any needed adjustments to your workstation to promote good posture.   Avoid positions and activities that make your symptoms worse.   Warm up and stretch before being active to help prevent problems.  SEEK MEDICAL CARE IF:   Your pain is not controlled with medicine.   You are unable to decrease your pain medicine over time as planned.   Your activity level is not improving as expected.  SEEK IMMEDIATE MEDICAL CARE IF:   You develop any bleeding.  You develop stomach upset.  You have signs of an allergic reaction to your medicine.   Your symptoms get worse.   You develop new, unexplained symptoms.   You have numbness, tingling, weakness, or paralysis in any part of your body.  MAKE SURE YOU:   Understand these instructions.  Will watch your condition.  Will get help right away if you are not doing well or get worse. Document Released: 06/16/2007 Document Revised: 08/24/2013 Document Reviewed: 02/24/2013 [facility] Patient Information 2015 [employer] [place], [place]. This information is not intended to replace advice given to you by your health care provider. Make sure you discuss any questions you have with your health care provider. Your x-ray is normal.  You have a strain of the muscles and ligaments.  You have been given medication for pain muscle relaxation, and anti-inflammatory medicines.  Please take this on  a regular basis as directed even apply heat or cold your preference for the next several days to the areas that are painful

## 2016-04-14 IMAGING — CR DG CHEST 2V
2 series · 2 of 2 positions shown · non-contrast
Comparison: 08/16/2011

CLINICAL DATA: Right lower abdominal pain. Chest pain. Initial
encounter

EXAM:
CHEST  2 VIEW

[w chest pa]
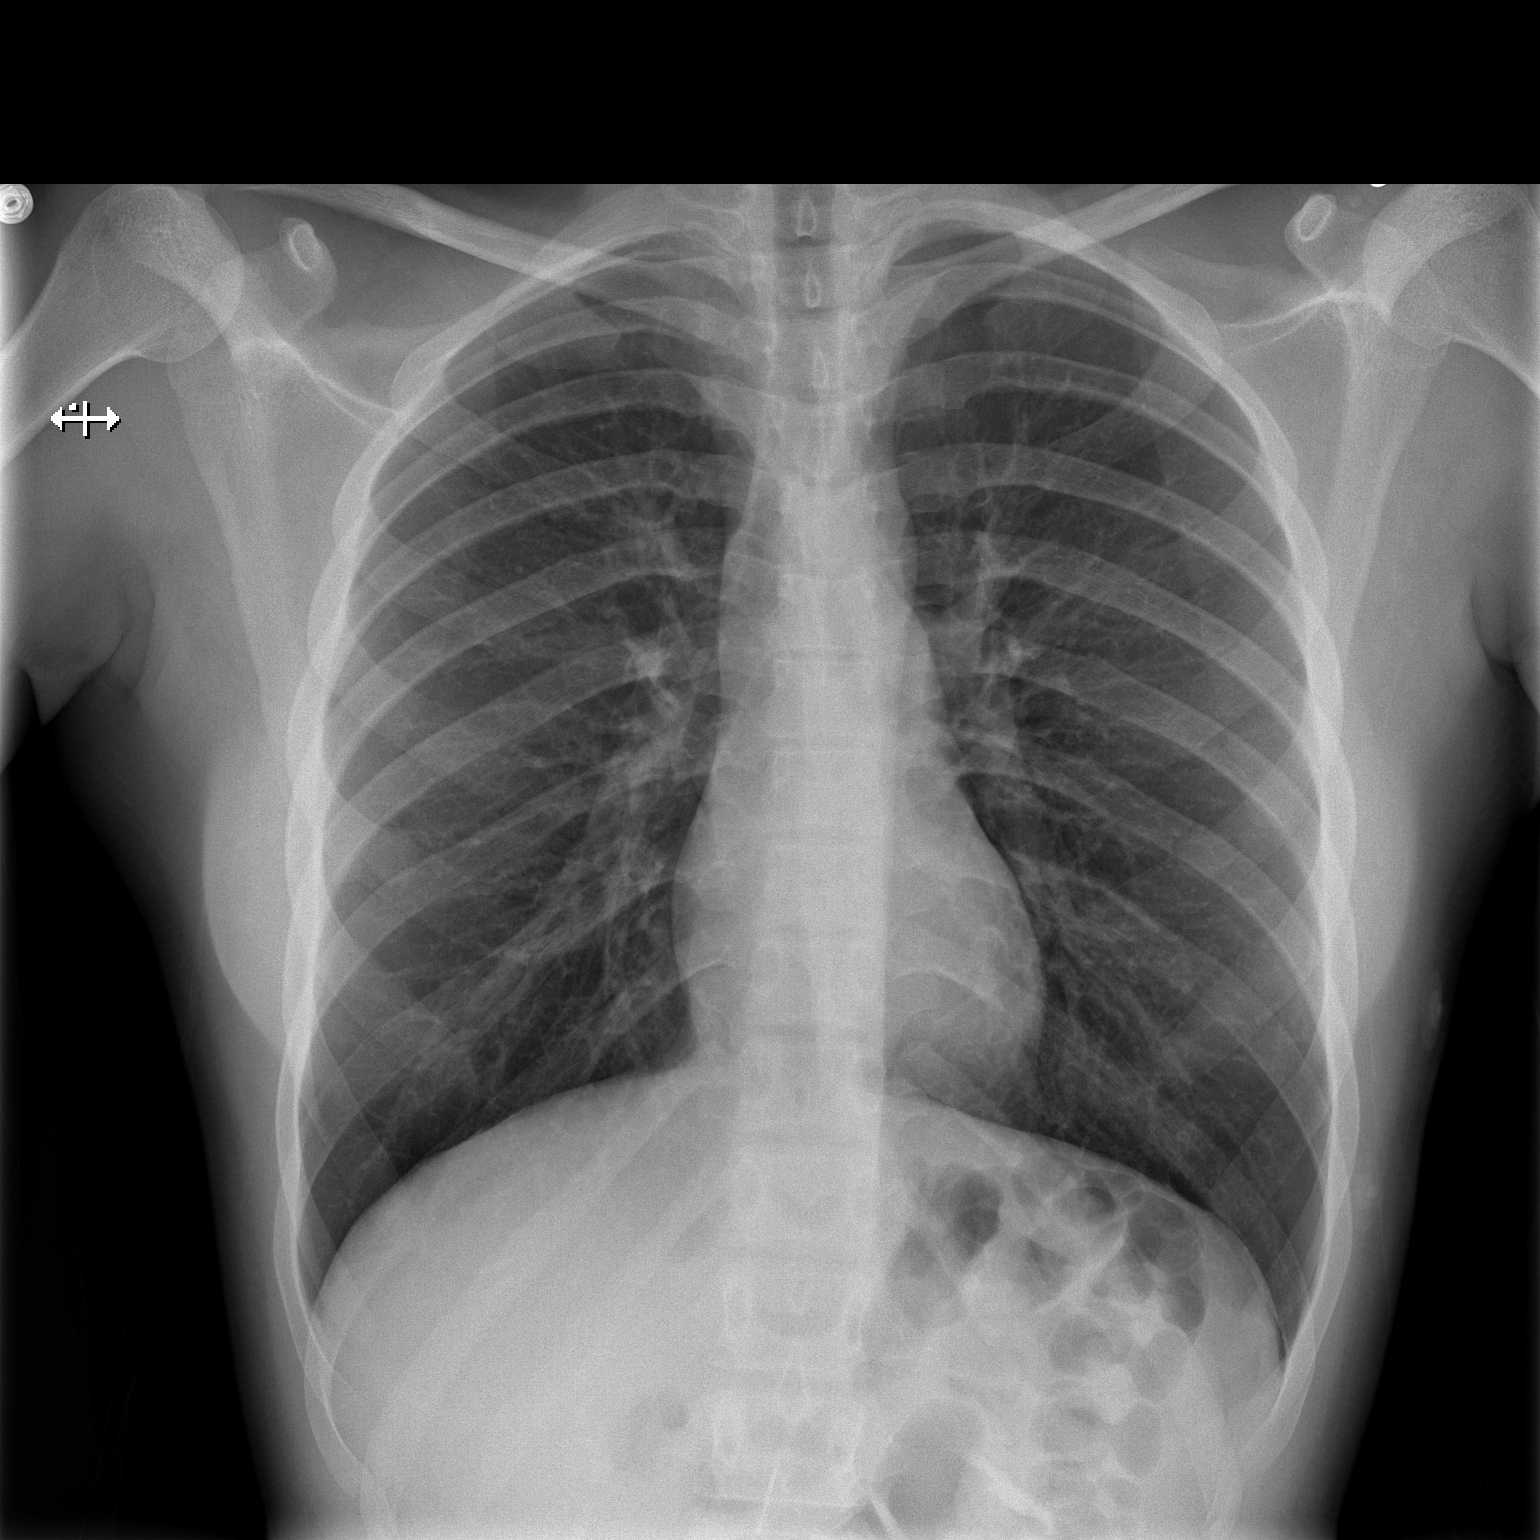

[w chest lat]
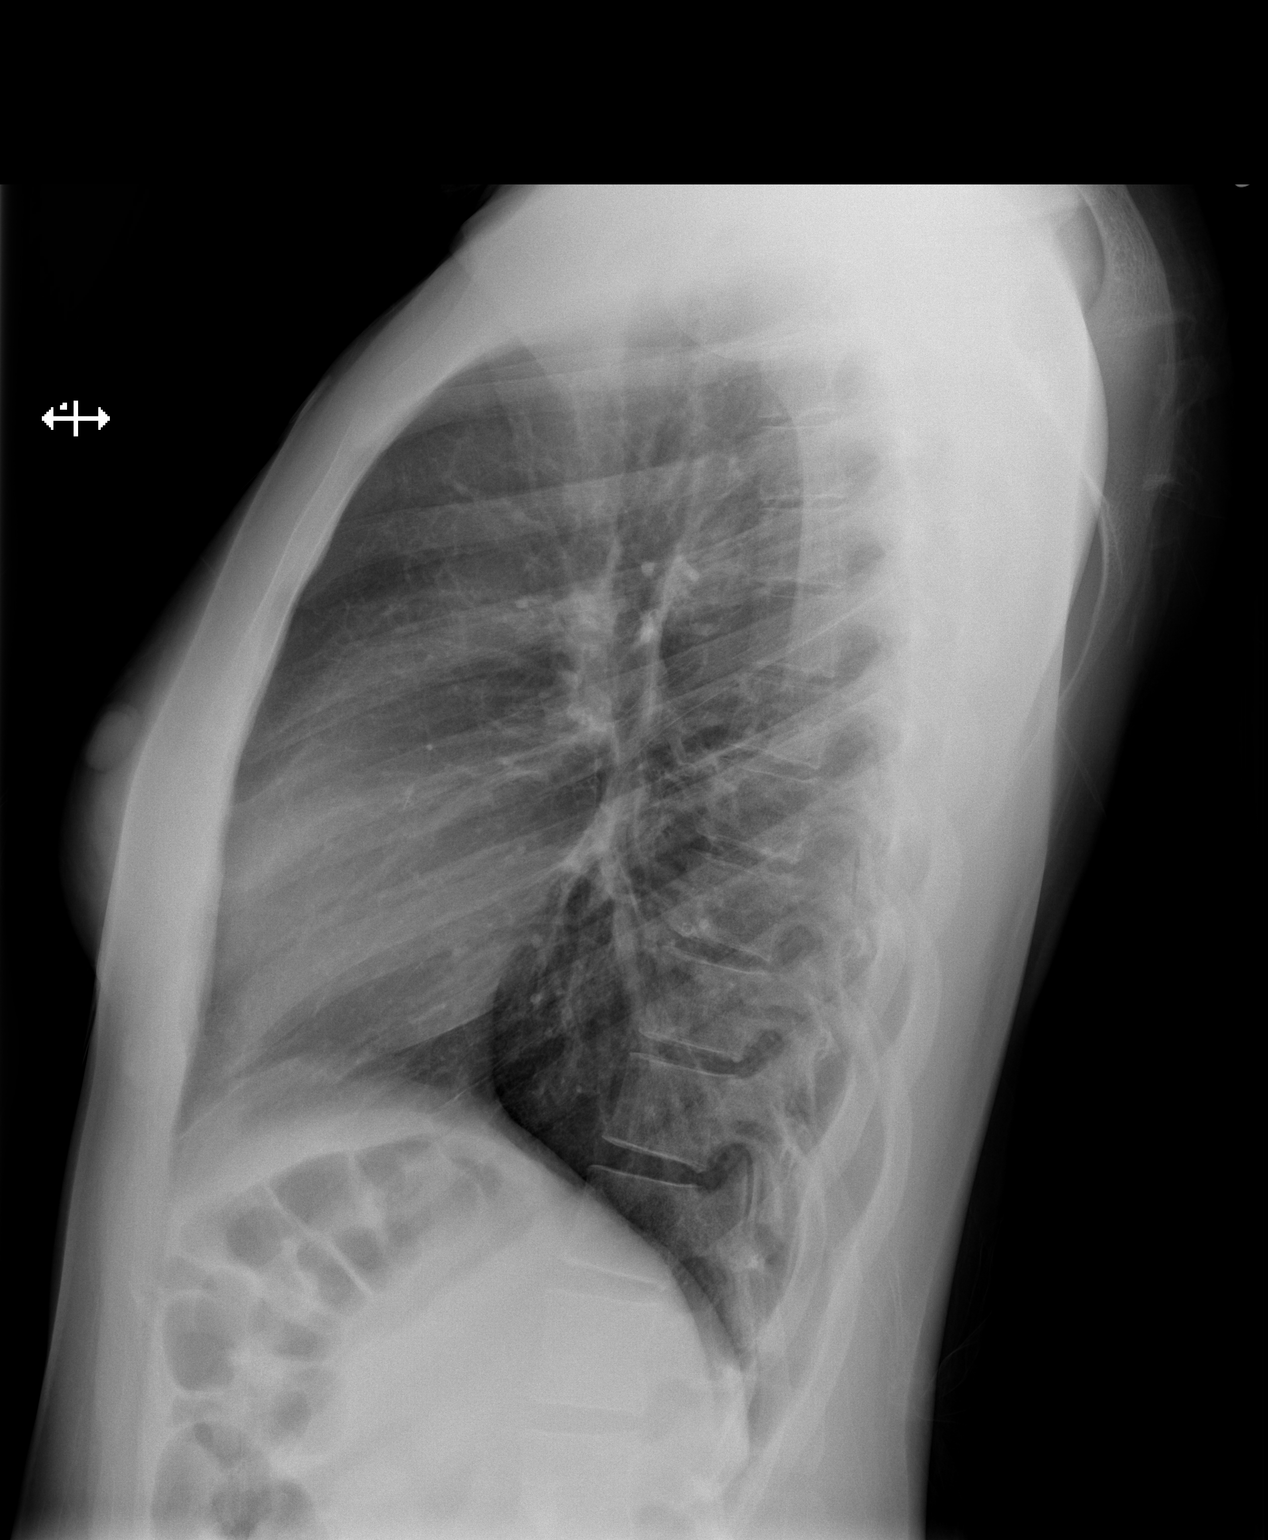

[2 of 2 positions shown; findings below may reference images not displayed]

FINDINGS: Normal heart size and mediastinal contours. No acute infiltrate or
edema. No effusion or pneumothorax. No acute osseous findings.
IMPRESSION: No active cardiopulmonary disease.

## 2016-05-28 ENCOUNTER — Emergency Department (HOSPITAL_COMMUNITY)
Admission: EM | Admit: 2016-05-28 | Discharge: 2016-05-28 | Disposition: A | Discharge status: Home / Self Care | Payer: Medicaid Other | Attending: Emergency Medicine | Admitting: Emergency Medicine

## 2016-05-28 ENCOUNTER — Encounter (HOSPITAL_COMMUNITY): Payer: Self-pay

## 2016-05-28 ENCOUNTER — Emergency Department (HOSPITAL_BASED_OUTPATIENT_CLINIC_OR_DEPARTMENT_OTHER)
Admit: 2016-05-28 | Discharge: 2016-05-28 | Disposition: A | Discharge status: Home / Self Care | Payer: Medicaid Other | Attending: Emergency Medicine | Admitting: Emergency Medicine

## 2016-05-28 ENCOUNTER — Ambulatory Visit (HOSPITAL_COMMUNITY)
Admission: EM | Admit: 2016-05-28 | Discharge: 2016-05-28 | Disposition: A | Discharge status: Nursing Home (Includes ICF) | Payer: Medicaid Other | Attending: Family Medicine | Admitting: Family Medicine

## 2016-05-28 ENCOUNTER — Encounter (HOSPITAL_COMMUNITY): Payer: Self-pay | Admitting: *Deleted

## 2016-05-28 DIAGNOSIS — M7989 Other specified soft tissue disorders: Secondary | ICD-10-CM

## 2016-05-28 DIAGNOSIS — M79609 Pain in unspecified limb: Secondary | ICD-10-CM | POA: Diagnosis not present

## 2016-05-28 DIAGNOSIS — J45909 Unspecified asthma, uncomplicated: Secondary | ICD-10-CM | POA: Diagnosis not present

## 2016-05-28 NOTE — ED Notes (Signed)
EDP at bedside  

## 2016-05-28 NOTE — ED Triage Notes (Signed)
Pt    Reports   Pain  Swelling     Of  Calf  Of  His  l  Leg      Pt  denys  Any  Injury  denys  Any  Recent  Air  Travel      denys  Any chest pain    Or  Shortness  Of  Breath         She   Ambulated  To  Room  With  A  Steady    Fluid  gait

## 2016-05-28 NOTE — Progress Notes (Signed)
**  Preliminary report by tech**  Bilateral lower extremity venous duplex completed. There is no evidence of deep or superficial vein thrombosis involving the left lower extremity. All visualized vessels appear patent and compressible. There is no evidence of a Baker's cyst on the left.  05/28/16 5:51 PM Olen CordialGreg Mariame Rybolt RVT

## 2016-05-28 NOTE — ED Notes (Signed)
[facility]  [facility]  [facility]

## 2016-05-28 NOTE — ED Provider Notes (Signed)
[facility]    CSN: [id number] Arrival date & time: 05/28/16  1237     History   Chief Complaint Chief Complaint  Patient presents with  . Leg Swelling    HPI Gail Mathews is a 25 y.o. female.   The history is provided by the patient.  Leg Pain  Location:  Leg Injury: no   Leg location:  L lower leg Pain details:    Quality:  Aching   Severity:  Moderate   Onset quality:  Sudden   Duration:  2 days   Progression:  Worsening Chronicity:  New Dislocation: no   Foreign body present:  No foreign bodies Prior injury to area:  No Relieved by:  None tried Worsened by:  Nothing Ineffective treatments:  None tried Associated symptoms: swelling   Associated symptoms: no back pain, no decreased ROM, no fever and no stiffness     Past Medical History:  Diagnosis Date  . Allergic rhinitis   . Asthma     Patient Active Problem List   Diagnosis Date Noted  . Asthma 06/08/2011    History reviewed. No pertinent surgical history.  OB History    No data available       Home Medications    Prior to Admission medications   Medication Sig Start Date End Date Taking? Authorizing Provider  albuterol (PROVENTIL HFA;VENTOLIN HFA) 108 (90 BASE) MCG/ACT inhaler Inhale 2 puffs into the lungs every 6 (six) hours as needed for shortness of breath. 06/23/12   Johnsie Kindred, NP  benzonatate (TESSALON) 100 MG capsule Take 1 capsule (100 mg total) by mouth 2 (two) times daily as needed for cough. 07/13/14   Charm Rings, MD  cetirizine (ZYRTEC) 10 MG tablet Take 1 tablet (10 mg total) by mouth daily. 07/13/14   Charm Rings, MD  cyclobenzaprine (FLEXERIL) 5 MG tablet Take 1 tablet (5 mg total) by mouth 3 (three) times daily as needed for muscle spasms. 12/20/14   Earley Favor, NP  ibuprofen (ADVIL,MOTRIN) 600 MG tablet Take 1 tablet (600 mg total) by mouth 3 (three) times daily. 12/20/14   Earley Favor, NP  montelukast (SINGULAIR) 10 MG tablet Take 10 mg by mouth daily.      Historical Provider, MD  oxyCODONE-acetaminophen (PERCOCET/ROXICET) 5-325 MG per tablet Take 1 tablet by mouth every 8 (eight) hours as needed for severe pain. 12/20/14   Earley Favor, NP    Family History Family History  Problem Relation Age of Onset  . Asthma Mother   . Allergic rhinitis Mother     Social History Social History  Substance Use Topics  . Smoking status: Never Smoker  . Smokeless tobacco: Not on file  . Alcohol use No     Allergies   Amoxicillin   Review of Systems Review of Systems  Constitutional: Negative.  Negative for fever.  Musculoskeletal: Positive for joint swelling. Negative for back pain and stiffness.  All other systems reviewed and are negative.    Physical Exam Triage Vital Signs ED Triage Vitals  Enc Vitals Group     BP 05/28/16 1311 (!) 123/50     Pulse Rate 05/28/16 1311 82     Resp 05/28/16 1311 16     Temp 05/28/16 1311 98.8 F (37.1 C)     Temp Source 05/28/16 1311 Oral     SpO2 05/28/16 1311 100 %     Weight --      Height --  Head Circumference --      Peak Flow --      Pain Score 05/28/16 1337 8     Pain Loc --      Pain Edu? --      Excl. in GC? --    No data found.   Updated Vital Signs BP (!) 123/50 (BP Location: Left Arm)   Pulse 82   Temp 98.8 F (37.1 C) (Oral)   Resp 16   SpO2 100%   Visual Acuity Right Eye Distance:   Left Eye Distance:   Bilateral Distance:    Right Eye Near:   Left Eye Near:    Bilateral Near:     Physical Exam  Constitutional: She is oriented to person, place, and time. She appears well-developed and well-nourished. No distress.  Musculoskeletal: She exhibits tenderness.       Left lower leg: She exhibits tenderness, swelling and edema.       Legs: Neurological: She is alert and oriented to person, place, and time.  Skin: Skin is warm and dry.     UC Treatments / Results  Labs (all labs ordered are listed, but only abnormal results are displayed) Labs Reviewed  - No data to display  EKG  EKG Interpretation None       Radiology No results found.  Procedures Procedures (including critical care time)  Medications Ordered in UC Medications - No data to display   Initial Impression / Assessment and Plan / UC Course  I have reviewed the triage vital signs and the nursing notes.  Pertinent labs & imaging results that were available during my care of the patient were reviewed by me and considered in my medical decision making (see chart for details).  Clinical Course    Sent for doppler eval of left lower leg. Low dvt risk.  Final Clinical Impressions(s) / UC Diagnoses   Final diagnoses:  None    New Prescriptions New Prescriptions   No medications on file     Linna HoffJames D Nawaf Strange, MD 05/28/16 1416

## 2016-05-28 NOTE — ED Provider Notes (Signed)
[facility] Provider Note   CSN: [id number] Arrival date & time: 05/28/16  1436     History   Chief Complaint Chief Complaint  Patient presents with  . Leg Swelling    HPI Emelda FearJaleeshia Woodburn is a 25 y.o. female.  The history is provided by the patient. No language interpreter was used.    Emelda FearJaleeshia Fusaro is a 25 y.o. female who presents to the Emergency Department complaining of leg swelling. She reports 3 days of swelling and pain to her left leg and calf. No known injuries. She has a history of asthma and does take oral contraceptives. She is a nonsmoker and has no history of DVT. She works on her feet throughout the day. She denies any fevers, chest pain, shortness of breath, vomiting, nausea, vomiting.  Past Medical History:  Diagnosis Date  . Allergic rhinitis   . Asthma     Patient Active Problem List   Diagnosis Date Noted  . Asthma 06/08/2011    History reviewed. No pertinent surgical history.  OB History    No data available       Home Medications    Prior to Admission medications   Medication Sig Start Date End Date Taking? Authorizing Provider  albuterol (PROVENTIL HFA;VENTOLIN HFA) 108 (90 BASE) MCG/ACT inhaler Inhale 2 puffs into the lungs every 6 (six) hours as needed for shortness of breath. 06/23/12   Johnsie Kindredarmen L Chatten, NP  benzonatate (TESSALON) 100 MG capsule Take 1 capsule (100 mg total) by mouth 2 (two) times daily as needed for cough. 07/13/14   Charm RingsErin J Honig, MD  cetirizine (ZYRTEC) 10 MG tablet Take 1 tablet (10 mg total) by mouth daily. 07/13/14   Charm RingsErin J Honig, MD  cyclobenzaprine (FLEXERIL) 5 MG tablet Take 1 tablet (5 mg total) by mouth 3 (three) times daily as needed for muscle spasms. 12/20/14   Earley FavorGail Schulz, NP  ibuprofen (ADVIL,MOTRIN) 600 MG tablet Take 1 tablet (600 mg total) by mouth 3 (three) times daily. 12/20/14   Earley FavorGail Schulz, NP  montelukast (SINGULAIR) 10 MG tablet Take 10 mg by mouth daily.     Historical Provider, MD    oxyCODONE-acetaminophen (PERCOCET/ROXICET) 5-325 MG per tablet Take 1 tablet by mouth every 8 (eight) hours as needed for severe pain. 12/20/14   Earley FavorGail Schulz, NP    Family History Family History  Problem Relation Age of Onset  . Asthma Mother   . Allergic rhinitis Mother     Social History Social History  Substance Use Topics  . Smoking status: Never Smoker  . Smokeless tobacco: Never Used  . Alcohol use No     Allergies   Amoxicillin   Review of Systems Review of Systems  All other systems reviewed and are negative.    Physical Exam Updated Vital Signs BP 117/70 (BP Location: Right Arm)   Pulse 83   Temp 98.4 F (36.9 C) (Oral)   Resp 18   Ht 5\' 8"  (1.727 m)   Wt 150 lb (68 kg)   SpO2 100%   BMI 22.81 kg/m   Physical Exam  Constitutional: She is oriented to person, place, and time. She appears well-developed and well-nourished. No distress.  HENT:  Head: Normocephalic and atraumatic.  Neck: Neck supple.  Cardiovascular: Normal rate, regular rhythm and normal heart sounds.   No murmur heard. Pulmonary/Chest: Effort normal and breath sounds normal. No respiratory distress.  Musculoskeletal:  Mild left calf tenderness without any rash or evidence of infection. Minimal swelling to  the left lower extremity. Range of motion intact in the knee and ankle.  Neurological: She is alert and oriented to person, place, and time.  Skin: Skin is warm and dry. Capillary refill takes less than 2 seconds.  Psychiatric: She has a normal mood and affect. Her behavior is normal.  Nursing note and vitals reviewed.    ED Treatments / Results  Labs (all labs ordered are listed, but only abnormal results are displayed) Labs Reviewed - No data to display  EKG  EKG Interpretation None       Radiology No results found.  Procedures Procedures (including critical care time)  Medications Ordered in ED Medications - No data to display   Initial Impression /  Assessment and Plan / ED Course  I have reviewed the triage vital signs and the nursing notes.  Pertinent labs & imaging results that were available during my care of the patient were reviewed by me and considered in my medical decision making (see chart for details).  Clinical Course    Patient here for evaluation of left leg swelling and pain. DVT study is negative. There is no evidence of acute infectious process at this time. Recommend rest, ibuprofen, outpatient follow-up, return precautions.  Final Clinical Impressions(s) / ED Diagnoses   Final diagnoses:  Left leg swelling    New Prescriptions New Prescriptions   No medications on file     Tilden Fossa, MD 05/28/16 1820

## 2016-05-28 NOTE — ED Triage Notes (Signed)
Pt sent here by UC for possible DVT in left leg. No recent travel, no sob.

## 2016-05-28 NOTE — Discharge Instructions (Signed)
You can take ibuprofen, available over the counter, as needed for pain.  Get rechecked in one week if you continue to have leg swelling or pain.

## 2017-01-27 ENCOUNTER — Encounter (HOSPITAL_COMMUNITY): Payer: Self-pay | Admitting: Emergency Medicine

## 2017-01-27 ENCOUNTER — Ambulatory Visit (HOSPITAL_COMMUNITY)
Admission: EM | Admit: 2017-01-27 | Discharge: 2017-01-27 | Disposition: A | Discharge status: Home / Self Care | Payer: Medicaid Other | Attending: Family Medicine | Admitting: Family Medicine

## 2017-01-27 DIAGNOSIS — R062 Wheezing: Secondary | ICD-10-CM

## 2017-01-27 DIAGNOSIS — R0602 Shortness of breath: Secondary | ICD-10-CM | POA: Diagnosis not present

## 2017-01-27 DIAGNOSIS — J4521 Mild intermittent asthma with (acute) exacerbation: Secondary | ICD-10-CM | POA: Diagnosis not present

## 2017-01-27 MED ORDER — ALBUTEROL SULFATE HFA 108 (90 BASE) MCG/ACT IN AERS: 1.0000 | INHALATION_SPRAY | Freq: Four times a day (QID) | RESPIRATORY_TRACT | 0 refills | Status: DC | PRN

## 2017-01-27 MED ORDER — IPRATROPIUM-ALBUTEROL 0.5-2.5 (3) MG/3ML IN SOLN
3.0000 mL | Freq: Once | RESPIRATORY_TRACT | Status: AC
  Administered 2017-01-27: 3 mL via RESPIRATORY_TRACT

## 2017-01-27 MED ORDER — PREDNISONE 20 MG PO TABS: 20.0000 mg | ORAL_TABLET | Freq: Every day | ORAL | 0 refills | Status: AC

## 2017-01-27 MED ORDER — IPRATROPIUM-ALBUTEROL 0.5-2.5 (3) MG/3ML IN SOLN
RESPIRATORY_TRACT | Status: AC
  Filled 2017-01-27: qty 3

## 2017-01-27 NOTE — ED Triage Notes (Signed)
[facility] a [facility] and exacerbation of her asthma.

## 2017-01-27 NOTE — ED Provider Notes (Signed)
CSN: [id number]     Arrival date & time 01/27/17  1109 History   First MD Initiated Contact with Patient 01/27/17 1138     Chief Complaint  Patient presents with  . Shortness of Breath  . Asthma   (Consider location/radiation/quality/duration/timing/severity/associated sxs/prior Treatment) Patient is a well-appearing 26 year old female, with history of asthma that is intermittent and normally well-controlled, presents today for what she believes to be asthma exacerbation onset 2 days ago but getting worse today. Patient believes exacerbation is due to the weather changes. Patient has been using her pro-air inhaler home with no relief. Patient denies any chest pain or chest tightness.  HR is 100 on repeat; she did use her albuterol inhaler prior to arrival.       Past Medical History:  Diagnosis Date  . Allergic rhinitis   . Asthma    History reviewed. No pertinent surgical history. Family History  Problem Relation Age of Onset  . Asthma Mother   . Allergic rhinitis Mother    Social History  Substance Use Topics  . Smoking status: Never Smoker  . Smokeless tobacco: Never Used  . Alcohol use No   OB History    No data available     Review of Systems  Constitutional: Negative.   Respiratory: Positive for shortness of breath and wheezing. Negative for cough and chest tightness.   Cardiovascular: Negative for chest pain.  Gastrointestinal: Negative for abdominal pain.  Neurological: Negative for dizziness and headaches.    Allergies  Amoxicillin  Home Medications   Prior to Admission medications   Medication Sig Start Date End Date Taking? Authorizing Provider  cetirizine (ZYRTEC) 10 MG tablet Take 1 tablet (10 mg total) by mouth daily. 07/13/14  Yes Charm RingsHonig, Erin J, MD  montelukast (SINGULAIR) 10 MG tablet Take 10 mg by mouth daily.    Yes [provider]  albuterol (PROVENTIL HFA;VENTOLIN HFA) 108 (90 Base) MCG/ACT inhaler Inhale 1-2 puffs into the lungs  every 6 (six) hours as needed for wheezing or shortness of breath. 01/27/17   Lucia EstelleZheng, Branden Shallenberger, NP  predniSONE (DELTASONE) 20 MG tablet Take 1 tablet (20 mg total) by mouth daily with breakfast. Take 60 mg on day 1-2, 40 mg on day 3-4, 20 mg on day 5-6. 01/27/17 02/02/17  Lucia EstelleZheng, Eathon Valade, NP   Meds Ordered and Administered this Visit   Medications  ipratropium-albuterol (DUONEB) 0.5-2.5 (3) MG/3ML nebulizer solution 3 mL (3 mLs Nebulization Given 01/27/17 1150)    BP 130/68 (BP Location: Right Arm)   Pulse (!) 110   Temp 98.6 F (37 C) (Oral)   Resp 20   SpO2 98%  No data found.   Physical Exam  Constitutional: She is oriented to person, place, and time. She appears well-developed and well-nourished.  Cardiovascular: Normal rate, regular rhythm and normal heart sounds.   No murmur heard. Pulmonary/Chest: Effort normal. She has wheezes.  Mild wheezes noted in bilateral lower lobes. Patient in no distress. Able to carry full conversation.  Abdominal: Soft. Bowel sounds are normal. There is no tenderness.  Neurological: She is alert and oriented to person, place, and time.  Skin: Skin is warm and dry.  Psychiatric: She has a normal mood and affect.  Vitals reviewed.   Urgent Care Course     Procedures (including critical care time)  Labs Review Labs Reviewed - No data to display  Imaging Review No results found.  11:57: DuoNeb treatment administered; patient reports feeling so much better after the treatment.  MDM   1. Mild intermittent asthma with exacerbation    Patient is a 26 y.o. Female, here for asthma exacerbation. No red flags noted on exam. Patient responded well to DuoNeb treatment. Patient is safe to be discharged home.   1) Pro-air refilled, continue to use the albuterol inhaler as needed for shortness of breath and wheezing. 2) Start the prednisone 6 day taper. 3) Return precaution discussed.    Lucia Estelle, NP 01/27/17 1201

## 2018-05-03 ENCOUNTER — Encounter (HOSPITAL_COMMUNITY): Payer: Self-pay | Admitting: Emergency Medicine

## 2018-05-03 ENCOUNTER — Ambulatory Visit (HOSPITAL_COMMUNITY)
Admission: EM | Admit: 2018-05-03 | Discharge: 2018-05-03 | Disposition: A | Discharge status: Home / Self Care | Payer: Medicaid Other | Attending: Family Medicine | Admitting: Family Medicine

## 2018-05-03 DIAGNOSIS — J4521 Mild intermittent asthma with (acute) exacerbation: Secondary | ICD-10-CM

## 2018-05-03 MED ORDER — ALBUTEROL SULFATE HFA 108 (90 BASE) MCG/ACT IN AERS: 1.0000 | INHALATION_SPRAY | Freq: Four times a day (QID) | RESPIRATORY_TRACT | 2 refills | Status: DC | PRN

## 2018-05-03 MED ORDER — METHYLPREDNISOLONE SODIUM SUCC 125 MG IJ SOLR: 80.0000 mg | Freq: Once | INTRAMUSCULAR | Status: DC

## 2018-05-03 MED ORDER — PREDNISONE 50 MG PO TABS: 50.0000 mg | ORAL_TABLET | Freq: Every day | ORAL | 0 refills | Status: DC

## 2018-05-03 MED ORDER — FLUTICASONE PROPIONATE 50 MCG/ACT NA SUSP: 2.0000 | Freq: Every day | NASAL | 0 refills | Status: DC

## 2018-05-03 MED ORDER — IPRATROPIUM-ALBUTEROL 0.5-2.5 (3) MG/3ML IN SOLN
3.0000 mL | Freq: Once | RESPIRATORY_TRACT | Status: AC
  Administered 2018-05-03: 3 mL via RESPIRATORY_TRACT

## 2018-05-03 MED ORDER — IPRATROPIUM-ALBUTEROL 0.5-2.5 (3) MG/3ML IN SOLN: 3.0000 mL | Freq: Once | RESPIRATORY_TRACT | Status: DC

## 2018-05-03 MED ORDER — IPRATROPIUM-ALBUTEROL 0.5-2.5 (3) MG/3ML IN SOLN
RESPIRATORY_TRACT | Status: AC
  Filled 2018-05-03: qty 3

## 2018-05-03 MED ORDER — ALBUTEROL SULFATE HFA 108 (90 BASE) MCG/ACT IN AERS: 2.0000 | INHALATION_SPRAY | Freq: Once | RESPIRATORY_TRACT | Status: DC

## 2018-05-03 MED ORDER — IPRATROPIUM BROMIDE 0.06 % NA SOLN: 2.0000 | Freq: Four times a day (QID) | NASAL | 0 refills | Status: DC

## 2018-05-03 MED ORDER — BENZONATATE 100 MG PO CAPS: 100.0000 mg | ORAL_CAPSULE | Freq: Three times a day (TID) | ORAL | 0 refills | Status: DC

## 2018-05-03 MED ORDER — ALBUTEROL SULFATE HFA 108 (90 BASE) MCG/ACT IN AERS
INHALATION_SPRAY | RESPIRATORY_TRACT | Status: AC
  Filled 2018-05-03: qty 6.7

## 2018-05-03 MED ORDER — METHYLPREDNISOLONE SODIUM SUCC 125 MG IJ SOLR
INTRAMUSCULAR | Status: AC
  Filled 2018-05-03: qty 2

## 2018-05-03 NOTE — ED Provider Notes (Signed)
[facility]    CSN: [id number] Arrival date & time: 05/03/18  1042     History   Chief Complaint Chief Complaint  Patient presents with  . Asthma    HPI Makynze Babbitt is a 27 y.o. female.   27 year old female comes in for 1 week history of asthma exacerbation.  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  She had been using nebulizer at home as she does not have a inhaler.  3 days ago, started having URI symptoms including rhinorrhea, nasal congestion and cough.  At that time, shortness of breath and wheezing worsened and has no improvement with nebulizer.  She denies any fever, chills, night sweats.  States she tries to use the nebulizer prior to work to help with symptoms, and therefore last used 2 days ago.  Never smoker.  She has not had asthma exacerbation needing prednisone for many months- 1 year.      Past Medical History:  Diagnosis Date  . Allergic rhinitis   . Asthma     Patient Active Problem List   Diagnosis Date Noted  . Asthma 06/08/2011    History reviewed. No pertinent surgical history.  OB History   None      Home Medications    Prior to Admission medications   Medication Sig Start Date End Date Taking? Authorizing Provider  albuterol (PROVENTIL HFA;VENTOLIN HFA) 108 (90 Base) MCG/ACT inhaler Inhale 1-2 puffs into the lungs every 6 (six) hours as needed for wheezing or shortness of breath. 05/03/18   Cathie Hoops, Abraham Entwistle V, PA-C  benzonatate (TESSALON) 100 MG capsule Take 1 capsule (100 mg total) by mouth every 8 (eight) hours. 05/03/18   Cathie Hoops, Basma Buchner V, PA-C  cetirizine (ZYRTEC) 10 MG tablet Take 1 tablet (10 mg total) by mouth daily. 07/13/14   Charm Rings, MD  fluticasone (FLONASE) 50 MCG/ACT nasal spray Place 2 sprays into both nostrils daily. 05/03/18   Cathie Hoops, Laurette Villescas V, PA-C  ipratropium (ATROVENT) 0.06 % nasal spray Place 2 sprays into both nostrils 4 (four) times daily. 05/03/18   Cathie Hoops, Ethelmae Ringel V, PA-C  montelukast  (SINGULAIR) 10 MG tablet Take 10 mg by mouth daily.     [provider]  predniSONE (DELTASONE) 50 MG tablet Take 1 tablet (50 mg total) by mouth daily. 05/03/18   Belinda Fisher, PA-C    Family History Family History  Problem Relation Age of Onset  . Asthma Mother   . Allergic rhinitis Mother     Social History Social History   Tobacco Use  . Smoking status: Never Smoker  . Smokeless tobacco: Never Used  Substance Use Topics  . Alcohol use: No  . Drug use: No     Allergies   Amoxicillin   Review of Systems Review of Systems  Reason unable to perform ROS: See HPI as above.     Physical Exam Triage Vital Signs ED Triage Vitals [05/03/18 1105]  Enc Vitals Group     BP 129/84     Pulse Rate (!) 106     Resp 18     Temp 97.8 F (36.6 C)     Temp src      SpO2 100 %     Weight      Height      Head Circumference      Peak Flow      Pain Score      Pain Loc  Pain Edu?      Excl. in GC?    No data found.  Updated Vital Signs BP 129/84   Pulse (!) 106   Temp 97.8 F (36.6 C)   Resp 18   SpO2 100%   Physical Exam  Constitutional: She is oriented to person, place, and time. She appears well-developed and well-nourished. No distress.  HENT:  Head: Normocephalic and atraumatic.  Right Ear: Tympanic membrane, external ear and ear canal normal. Tympanic membrane is not erythematous and not bulging.  Left Ear: Tympanic membrane, external ear and ear canal normal. Tympanic membrane is not erythematous and not bulging.  Nose: Rhinorrhea present. Right sinus exhibits no maxillary sinus tenderness and no frontal sinus tenderness. Left sinus exhibits no maxillary sinus tenderness and no frontal sinus tenderness.  Mouth/Throat: Uvula is midline, oropharynx is clear and moist and mucous membranes are normal.  Eyes: Pupils are equal, round, and reactive to light. Conjunctivae are normal.  Neck: Normal range of motion. Neck supple.  Cardiovascular: Normal rate,  regular rhythm and normal heart sounds. Exam reveals no gallop and no friction rub.  No murmur heard. Pulmonary/Chest: Effort normal. No accessory muscle usage. No respiratory distress.  Patient able to talk in full sentences without difficulty.  Diffuse inspiratory and expiratory wheezing with decreased air movement.  Lymphadenopathy:    She has no cervical adenopathy.  Neurological: She is alert and oriented to person, place, and time.  Skin: Skin is warm and dry.  Psychiatric: She has a normal mood and affect. Her behavior is normal. Judgment normal.     UC Treatments / Results  Labs (all labs ordered are listed, but only abnormal results are displayed) Labs Reviewed - No data to display  EKG None  Radiology No results found.  Procedures Procedures (including critical care time)  Medications Ordered in UC Medications  methylPREDNISolone sodium succinate (SOLU-MEDROL) 125 mg/2 mL injection 80 mg (has no administration in time range)  ipratropium-albuterol (DUONEB) 0.5-2.5 (3) MG/3ML nebulizer solution 3 mL (has no administration in time range)  albuterol (PROVENTIL HFA;VENTOLIN HFA) 108 (90 Base) MCG/ACT inhaler 2 puff (has no administration in time range)  ipratropium-albuterol (DUONEB) 0.5-2.5 (3) MG/3ML nebulizer solution 3 mL (3 mLs Nebulization Given 05/03/18 1153)    Initial Impression / Assessment and Plan / UC Course  I have reviewed the triage vital signs and the nursing notes.  Pertinent labs & imaging results that were available during my care of the patient were reviewed by me and considered in my medical decision making (see chart for details).    We will start DuoNeb, and reassess.   Patient with improved air movement and wheezing after DuoNeb x1.  Still with inspiratory and expiratory wheezing to the periphery.  Will start second DuoNeb and reassess.  Patient states with much improved symptoms after Duoneb x 2, still with mild inspiratory and expiratory  wheezing to the periphery.  Solu-Medrol injection in office today.  We will have patient start prednisone as directed.  Out butyryl inhaler refilled.  Other symptomatic treatment discussed.  Return precautions given.  Patient expresses understanding and agrees to plan.  Final Clinical Impressions(s) / UC Diagnoses   Final diagnoses:  Mild intermittent asthma with exacerbation   ED Prescriptions    Medication Sig Dispense Auth. Provider   albuterol (PROVENTIL HFA;VENTOLIN HFA) 108 (90 Base) MCG/ACT inhaler Inhale 1-2 puffs into the lungs every 6 (six) hours as needed for wheezing or shortness of breath. 1 Inhaler Belinda Fisher, PA-C  predniSONE (DELTASONE) 50 MG tablet Take 1 tablet (50 mg total) by mouth daily. 5 tablet Powell Halbert V, PA-C   ipratropium (ATROVENT) 0.06 % nasal spray Place 2 sprays into both nostrils 4 (four) times daily. 15 mL Zniya Cottone V, PA-C   fluticasone (FLONASE) 50 MCG/ACT nasal spray Place 2 sprays into both nostrils daily. 1 g Nemiah Kissner V, PA-C   benzonatate (TESSALON) 100 MG capsule Take 1 capsule (100 mg total) by mouth every 8 (eight) hours. 21 capsule Threasa Alpha, [place] 05/03/18 1238

## 2018-05-03 NOTE — ED Triage Notes (Addendum)
Pt c/o asthma flair up x4 days, uanble to get her inhaler refilled. Pt states she has a home nebulizer shes used without relief.

## 2018-05-03 NOTE — Discharge Instructions (Signed)
Start prednisone as directed. Tessalon for cough. Start flonase, atrovent nasal spray for nasal congestion/drainage. You can use over the counter nasal saline rinse such as neti pot for nasal congestion. Keep hydrated, your urine should be clear to pale yellow in color. Tylenol/motrin for fever and pain. Monitor for any worsening of symptoms, chest pain, shortness of breath, wheezing, swelling of the throat, follow up for reevaluation.   For sore throat/cough try using a honey-based tea. Use 3 teaspoons of honey with juice squeezed from half lemon. Place shaved pieces of ginger into 1/2-1 cup of water and warm over stove top. Then mix the ingredients and repeat every 4 hours as needed.

## 2018-06-30 ENCOUNTER — Telehealth (HOSPITAL_COMMUNITY): Payer: Self-pay

## 2018-10-15 ENCOUNTER — Ambulatory Visit (HOSPITAL_COMMUNITY)
Admission: EM | Admit: 2018-10-15 | Discharge: 2018-10-15 | Disposition: A | Discharge status: Home / Self Care | Payer: Medicaid Other | Attending: Family Medicine | Admitting: Family Medicine

## 2018-10-15 ENCOUNTER — Encounter (HOSPITAL_COMMUNITY): Payer: Self-pay | Admitting: Emergency Medicine

## 2018-10-15 DIAGNOSIS — R05 Cough: Secondary | ICD-10-CM

## 2018-10-15 DIAGNOSIS — J069 Acute upper respiratory infection, unspecified: Secondary | ICD-10-CM

## 2018-10-15 DIAGNOSIS — R0981 Nasal congestion: Secondary | ICD-10-CM

## 2018-10-15 MED ORDER — DEXAMETHASONE SODIUM PHOSPHATE 10 MG/ML IJ SOLN
10.0000 mg | Freq: Once | INTRAMUSCULAR | Status: AC
  Administered 2018-10-15: 10 mg via INTRAMUSCULAR

## 2018-10-15 MED ORDER — FLUTICASONE PROPIONATE 50 MCG/ACT NA SUSP: 2.0000 | Freq: Every day | NASAL | 0 refills | Status: DC

## 2018-10-15 MED ORDER — CETIRIZINE-PSEUDOEPHEDRINE ER 5-120 MG PO TB12: 1.0000 | ORAL_TABLET | Freq: Every day | ORAL | 0 refills | Status: DC

## 2018-10-15 MED ORDER — DEXAMETHASONE SODIUM PHOSPHATE 10 MG/ML IJ SOLN
INTRAMUSCULAR | Status: AC
  Filled 2018-10-15: qty 1

## 2018-10-15 NOTE — ED Provider Notes (Signed)
[facility]    CSN: [id number] Arrival date & time: 10/15/18  1042     History   Chief Complaint Chief Complaint  Patient presents with  . URI    HPI Gail Mathews is a 28 y.o. female.   Patient is a 28 year old female with past medical history of allergic rhinitis and allergies.  She presents with 4 days of sneezing, coughing, cold chills, fever, myalgias and mild headache.  Symptoms have been constant and remain the same.  She is been doing Flonase, Zyrtec and ibuprofen for her symptoms.  She gets mild relief from these medicines.  She has a lot of allergies and have recent allergy testing done.  Denies any recent sick contacts.  Denies any recent traveling.  No nausea, vomiting or diarrhea.  ROS per HPI      Past Medical History:  Diagnosis Date  . Allergic rhinitis   . Asthma     Patient Active Problem List   Diagnosis Date Noted  . Asthma 06/08/2011    History reviewed. No pertinent surgical history.  OB History   No obstetric history on file.      Home Medications    Prior to Admission medications   Medication Sig Start Date End Date Taking? Authorizing Provider  albuterol (PROVENTIL HFA;VENTOLIN HFA) 108 (90 Base) MCG/ACT inhaler Inhale 1-2 puffs into the lungs every 6 (six) hours as needed for wheezing or shortness of breath. 05/03/18   Cathie Hoops, Amy V, PA-C  benzonatate (TESSALON) 100 MG capsule Take 1 capsule (100 mg total) by mouth every 8 (eight) hours. 05/03/18   Cathie Hoops, Amy V, PA-C  cetirizine (ZYRTEC) 10 MG tablet Take 1 tablet (10 mg total) by mouth daily. 07/13/14   Charm Rings, MD  cetirizine-pseudoephedrine (ZYRTEC-D) 5-120 MG tablet Take 1 tablet by mouth daily. 10/15/18   Nataki Mccrumb, Gloris Manchester A, NP  fluticasone (FLONASE) 50 MCG/ACT nasal spray Place 2 sprays into both nostrils daily. 10/15/18   Dahlia Byes A, NP  ipratropium (ATROVENT) 0.06 % nasal spray Place 2 sprays into both nostrils 4 (four) times daily. 05/03/18   Cathie Hoops, Amy V, PA-C  montelukast  (SINGULAIR) 10 MG tablet Take 10 mg by mouth daily.     [provider]  predniSONE (DELTASONE) 50 MG tablet Take 1 tablet (50 mg total) by mouth daily. 05/03/18   Belinda Fisher, PA-C    Family History Family History  Problem Relation Age of Onset  . Asthma Mother   . Allergic rhinitis Mother     Social History Social History   Tobacco Use  . Smoking status: Never Smoker  . Smokeless tobacco: Never Used  Substance Use Topics  . Alcohol use: No  . Drug use: No     Allergies   Other and Amoxicillin   Review of Systems Review of Systems   Physical Exam Triage Vital Signs ED Triage Vitals  Enc Vitals Group     BP 10/15/18 1126 129/60     Pulse Rate 10/15/18 1126 (!) 124     Resp 10/15/18 1126 18     Temp 10/15/18 1126 99.5 F (37.5 C)     Temp Source 10/15/18 1126 Oral     SpO2 10/15/18 1126 97 %     Weight --      Height --      Head Circumference --      Peak Flow --      Pain Score 10/15/18 1128 0     Pain Loc --  Pain Edu? --      Excl. in GC? --    No data found.  Updated Vital Signs BP 129/60 (BP Location: Right Arm)   Pulse (!) 124   Temp 99.5 F (37.5 C) (Oral)   Resp 18   SpO2 97%   Visual Acuity Right Eye Distance:   Left Eye Distance:   Bilateral Distance:    Right Eye Near:   Left Eye Near:    Bilateral Near:     Physical Exam Vitals signs and nursing note reviewed.  Constitutional:      General: She is not in acute distress.    Appearance: She is well-developed.  HENT:     Head: Normocephalic and atraumatic.     Right Ear: Tympanic membrane and ear canal normal.     Left Ear: Tympanic membrane and ear canal normal.     Nose: Congestion and rhinorrhea present.     Right Turbinates: Swollen and pale.     Left Turbinates: Swollen and pale.  Eyes:     Conjunctiva/sclera: Conjunctivae normal.  Neck:     Musculoskeletal: Neck supple.  Cardiovascular:     Rate and Rhythm: Normal rate and regular rhythm.     Heart sounds:  No murmur.  Pulmonary:     Effort: Pulmonary effort is normal. No respiratory distress.     Breath sounds: Normal breath sounds.  Musculoskeletal: Normal range of motion.  Skin:    General: Skin is warm and dry.  Neurological:     Mental Status: She is alert.  Psychiatric:        Mood and Affect: Mood normal.      UC Treatments / Results  Labs (all labs ordered are listed, but only abnormal results are displayed) Labs Reviewed - No data to display  EKG None  Radiology No results found.  Procedures Procedures (including critical care time)  Medications Ordered in UC Medications  dexamethasone (DECADRON) injection 10 mg (10 mg Intramuscular Given 10/15/18 1223)    Initial Impression / Assessment and Plan / UC Course  I have reviewed the triage vital signs and the nursing notes.  Pertinent labs & imaging results that were available during my care of the patient were reviewed by me and considered in my medical decision making (see chart for details).     Viral URI with severe congestion and rhinorrhea.  Flonase, zyrtec D for symptoms  Tylenol/ibuprofen for fever and body aches Mucinex for cough, congestion Steroid injection given here for severe nasal swelling and inflammation Told to follow-up for continued or worsening symptoms Final Clinical Impressions(s) / UC Diagnoses   Final diagnoses:  Viral upper respiratory tract infection     Discharge Instructions     I believe this is a viral upper respiratory infection Zyrtec-D and Flonase for sinus congestion and runny nose. Steroid injection given here for severe nasal swelling. Work note given Follow up as needed for continued or worsening symptoms     ED Prescriptions    Medication Sig Dispense Auth. Provider   fluticasone (FLONASE) 50 MCG/ACT nasal spray Place 2 sprays into both nostrils daily. 1 g Adilynn Bessey A, NP   cetirizine-pseudoephedrine (ZYRTEC-D) 5-120 MG tablet Take 1 tablet by mouth daily.  30 tablet Dahlia Byes A, NP     Controlled Substance Prescriptions [place] Controlled Substance Registry consulted? Not Applicable   Janace Aris, NP 10/15/18 1236

## 2018-10-15 NOTE — ED Triage Notes (Signed)
PT C/O: cold like sx onset 4 days associated w/headaches, sneezing, coughing, hot/cold chills, fevers  DENIES: vomiting, diarrhea  TAKING MEDS: Nasal spray and ibuprofen   A&O x4... NAD... Ambulatory

## 2018-10-15 NOTE — Discharge Instructions (Addendum)
I believe this is a viral upper respiratory infection Zyrtec-D and Flonase for sinus congestion and runny nose. Steroid injection given here for severe nasal swelling. Work note given Follow up as needed for continued or worsening symptoms

## 2019-06-12 ENCOUNTER — Other Ambulatory Visit: Payer: Self-pay

## 2019-06-12 ENCOUNTER — Ambulatory Visit (HOSPITAL_COMMUNITY)
Admission: EM | Admit: 2019-06-12 | Discharge: 2019-06-12 | Disposition: A | Discharge status: Home / Self Care | Payer: Self-pay

## 2019-06-12 ENCOUNTER — Encounter (HOSPITAL_COMMUNITY): Payer: Self-pay

## 2019-06-12 ENCOUNTER — Ambulatory Visit (INDEPENDENT_AMBULATORY_CARE_PROVIDER_SITE_OTHER): Payer: Self-pay

## 2019-06-12 DIAGNOSIS — M25461 Effusion, right knee: Secondary | ICD-10-CM

## 2019-06-12 DIAGNOSIS — S8001XA Contusion of right knee, initial encounter: Secondary | ICD-10-CM
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
## 2019-06-12 NOTE — ED Provider Notes (Signed)
[facility]    CSN: [id number] Arrival date & time: 06/12/19  1603      History   Chief Complaint Chief Complaint  Patient presents with  . Knee Pain    HPI Gail Mathews is a 28 y.o. female. who present with R knee pain after hitting it on a cart at work 2 days ago. She continued working after taking Ibuprofen and did not bother her anymore. She denies icing the area. She wore a brace the next day and seemed to help her as long with taking Ibuprofen 600 mg and the pain came back when she took it off. Her R knee is swollen and she never injured this knee before. Pain is described as sharp pain, provoked to lift something heavy, or pressure on leg. Pain is better with rest.     Past Medical History:  Diagnosis Date  . Allergic rhinitis   . Asthma     Patient Active Problem List   Diagnosis Date Noted  . Asthma 06/08/2011    History reviewed. No pertinent surgical history.  OB History   No obstetric history on file.      Home Medications    Prior to Admission medications   Medication Sig Start Date End Date Taking? Authorizing Provider  ibuprofen (ADVIL) 200 MG tablet Take 200 mg by mouth every 6 (six) hours as needed.   Yes [provider]  albuterol (PROVENTIL HFA;VENTOLIN HFA) 108 (90 Base) MCG/ACT inhaler Inhale 1-2 puffs into the lungs every 6 (six) hours as needed for wheezing or shortness of breath. 05/03/18   Cathie HoopsYu, Amy V, PA-C  benzonatate (TESSALON) 100 MG capsule Take 1 capsule (100 mg total) by mouth every 8 (eight) hours. 05/03/18   Cathie HoopsYu, Amy V, PA-C  cetirizine (ZYRTEC) 10 MG tablet Take 1 tablet (10 mg total) by mouth daily. 07/13/14   Charm RingsHonig, Erin J, MD  cetirizine-pseudoephedrine (ZYRTEC-D) 5-120 MG tablet Take 1 tablet by mouth daily. 10/15/18   Bast, Gloris Manchesterraci A, NP  fluticasone (FLONASE) 50 MCG/ACT nasal spray Place 2 sprays into both nostrils daily. 10/15/18   Dahlia ByesBast, Traci A, NP  ipratropium (ATROVENT) 0.06 % nasal spray Place 2 sprays into  both nostrils 4 (four) times daily. 05/03/18   Cathie HoopsYu, Amy V, PA-C  medroxyPROGESTERone (DEPO-PROVERA) 150 MG/ML injection INJECT 1 ML (150 MG DOSE) INTO THE MUSCLE EVERY 3 (THREE) MONTHS. 04/06/19   [provider]  montelukast (SINGULAIR) 10 MG tablet Take 10 mg by mouth daily.     [provider]  predniSONE (DELTASONE) 50 MG tablet Take 1 tablet (50 mg total) by mouth daily. 05/03/18   Belinda FisherYu, Amy V, PA-C    Family History Family History  Problem Relation Age of Onset  . Asthma Mother   . Allergic rhinitis Mother     Social History Social History   Tobacco Use  . Smoking status: Never Smoker  . Smokeless tobacco: Never Used  Substance Use Topics  . Alcohol use: No  . Drug use: No     Allergies   Other and Amoxicillin   Review of Systems Review of Systems  Constitutional: Negative for activity change, chills and fever.  Musculoskeletal: Positive for arthralgias and joint swelling. Negative for gait problem.  Skin: Negative for color change, pallor, rash and wound.  Neurological: Negative for numbness.     Physical Exam Triage Vital Signs ED Triage Vitals  Enc Vitals Group     BP 06/12/19 1617 136/86     Pulse  Rate 06/12/19 1617 (!) 105     Resp 06/12/19 1617 16     Temp 06/12/19 1617 98.2 F (36.8 C)     Temp Source 06/12/19 1617 Oral     SpO2 --      Weight --      Height --      Head Circumference --      Peak Flow --      Pain Score 06/12/19 1614 6     Pain Loc --      Pain Edu? --      Excl. in [place]? --    No data found.  Updated Vital Signs BP 136/86 (BP Location: Left Arm)   Pulse (!) 105   Temp 98.2 F (36.8 C) (Oral)   Resp 16   Visual Acuity Right Eye Distance:   Left Eye Distance:   Bilateral Distance:    Right Eye Near:   Left Eye Near:    Bilateral Near:     Physical Exam Vitals signs and nursing note reviewed.  Constitutional:      General: She is not in acute distress.    Appearance: Normal appearance. She is not  toxic-appearing.  HENT:     Head: Atraumatic.     Right Ear: External ear normal.     Left Ear: External ear normal.     Nose: Nose normal.  Eyes:     General: No scleral icterus.    Conjunctiva/sclera: Conjunctivae normal.  Neck:     Musculoskeletal: Neck supple.  Cardiovascular:     Pulses: Normal pulses.  Pulmonary:     Effort: Pulmonary effort is normal.  Musculoskeletal: Normal range of motion.        General: Swelling and tenderness present. No deformity or signs of injury.     Comments: R KNEE- with moderate suprapatellar effusion, has tenderness on the border of the top of the patella. No skin abrasions or ecchymosis noted. ROM is normal, with no laxity noted.   Skin:    General: Skin is warm and dry.     Capillary Refill: Capillary refill takes less than 2 seconds.     Findings: No bruising, erythema, lesion or rash.  Neurological:     Mental Status: She is alert and oriented to person, place, and time.     Motor: No weakness.     Gait: Gait normal.     Deep Tendon Reflexes: Reflexes normal.  Psychiatric:        Mood and Affect: Mood normal.        Behavior: Behavior normal.        Thought Content: Thought content normal.        Judgment: Judgment normal.      UC Treatments / Results  Labs (all labs ordered are listed, but only abnormal results are displayed) Labs Reviewed - No data to display  EKG   Radiology Dg Knee Ap/lat W/sunrise Right  Result Date: 06/12/2019 CLINICAL DATA:  Right knee pain and swelling since the patient bumped her knee 2 days ago. Initial encounter. EXAM: RIGHT KNEE 3 VIEWS COMPARISON:  None. FINDINGS: No evidence of fracture, dislocation, or joint effusion. No evidence of arthropathy or other focal bone abnormality. Soft tissues are unremarkable. IMPRESSION: Negative exam. Electronically Signed   By: Inge Rise M.D.   On: 06/12/2019 17:11    Procedures Procedures (including critical care time)  Medications Ordered in UC  Medications - No data to display  Initial Impression / Assessment  and Plan / UC Course  I have reviewed the triage vital signs and the nursing notes.  Pertinent  imaging results that were available during my care of the patient were reviewed by me and considered in my medical decision making (see chart for details). She was advised to keep on wearing the knee brace she has been wearing,but only when walking, may take Ibuprofen 600 mg q8h. Needs to FU with ortho next week.  Note for work sent and may return to work tomorrow.     Final Clinical Impressions(s) / UC Diagnoses   Final diagnoses:  Contusion of right knee, initial encounter  Knee effusion, right     Discharge Instructions     You may continue using a knee sleeve and take Ibuprofen 600 mg every 8h for pain.     ED Prescriptions    None     PDMP not reviewed this encounter.   Garey Ham, [place] 06/12/19 1919

## 2019-06-12 NOTE — Discharge Instructions (Addendum)
You may continue using a knee sleeve and take Ibuprofen 600 mg every 8h for pain.

## 2019-06-15 ENCOUNTER — Ambulatory Visit (HOSPITAL_COMMUNITY)
Admission: EM | Admit: 2019-06-15 | Discharge: 2019-06-15 | Disposition: A | Discharge status: Home / Self Care | Payer: Self-pay | Attending: Emergency Medicine | Admitting: Emergency Medicine

## 2019-06-15 ENCOUNTER — Encounter (HOSPITAL_COMMUNITY): Payer: Self-pay

## 2019-06-15 DIAGNOSIS — J4521 Mild intermittent asthma with (acute) exacerbation: Secondary | ICD-10-CM

## 2019-06-15 MED ORDER — ALBUTEROL SULFATE HFA 108 (90 BASE) MCG/ACT IN AERS: 1.0000 | INHALATION_SPRAY | Freq: Four times a day (QID) | RESPIRATORY_TRACT | 0 refills | Status: DC | PRN

## 2019-06-15 MED ORDER — METHYLPREDNISOLONE SODIUM SUCC 125 MG IJ SOLR
125.0000 mg | Freq: Once | INTRAMUSCULAR | Status: AC
  Administered 2019-06-15: 125 mg via INTRAMUSCULAR

## 2019-06-15 MED ORDER — METHYLPREDNISOLONE SODIUM SUCC 125 MG IJ SOLR
INTRAMUSCULAR | Status: AC
  Filled 2019-06-15: qty 2

## 2019-06-15 MED ORDER — PREDNISONE 50 MG PO TABS: 50.0000 mg | ORAL_TABLET | Freq: Every day | ORAL | 0 refills | Status: AC

## 2019-06-15 NOTE — ED Provider Notes (Addendum)
[facility]    CSN: [id number] Arrival date & time: 06/15/19  1833      History   Chief Complaint Chief Complaint  Patient presents with  . Wheezing  . Shortness of Breath    HPI Gail Mathews is a 28 y.o. female history of asthma, allergic rhinitis presenting today for evaluation of shortness of breath.  Patient states that over the past 2 to 3 days she has had worsening asthma symptoms of shortness of breath and wheezing.  She has had some chest tightness, but does not feel any chest pain.  She denies other URI symptoms of congestion or sore throat.  Patient does take a daily Zyrtec.  She also denies any fevers chills or body aches.  Feels her asthma exacerbation is related to the weather as she often has exacerbations with season changes.  She has been using her albuterol inhaler as well as tried 1-2 nebulizers at home.  Albuterol only provides temporary relief for approximately 30 minutes.  Feels similar to previous exacerbations.  HPI  Past Medical History:  Diagnosis Date  . Allergic rhinitis   . Asthma     Patient Active Problem List   Diagnosis Date Noted  . Asthma 06/08/2011    History reviewed. No pertinent surgical history.  OB History   No obstetric history on file.      Home Medications    Prior to Admission medications   Medication Sig Start Date End Date Taking? Authorizing Provider  albuterol (PROVENTIL HFA;VENTOLIN HFA) 108 (90 Base) MCG/ACT inhaler Inhale 1-2 puffs into the lungs every 6 (six) hours as needed for wheezing or shortness of breath. 05/03/18   Cathie Hoops, Amy V, PA-C  albuterol (VENTOLIN HFA) 108 (90 Base) MCG/ACT inhaler Inhale 1-2 puffs into the lungs every 6 (six) hours as needed for wheezing or shortness of breath. 06/15/19   ,  C, PA-C  benzonatate (TESSALON) 100 MG capsule Take 1 capsule (100 mg total) by mouth every 8 (eight) hours. 05/03/18   Cathie Hoops, Amy V, PA-C  cetirizine (ZYRTEC) 10 MG tablet Take 1 tablet (10 mg  total) by mouth daily. 07/13/14   Charm Rings, MD  cetirizine-pseudoephedrine (ZYRTEC-D) 5-120 MG tablet Take 1 tablet by mouth daily. 10/15/18   Bast, Gloris [place] A, NP  fluticasone (FLONASE) 50 MCG/ACT nasal spray Place 2 sprays into both nostrils daily. 10/15/18   Dahlia Byes A, NP  ibuprofen (ADVIL) 200 MG tablet Take 200 mg by mouth every 6 (six) hours as needed.    [provider]  ipratropium (ATROVENT) 0.06 % nasal spray Place 2 sprays into both nostrils 4 (four) times daily. 05/03/18   Cathie Hoops, Amy V, PA-C  medroxyPROGESTERone (DEPO-PROVERA) 150 MG/ML injection INJECT 1 ML (150 MG DOSE) INTO THE MUSCLE EVERY 3 (THREE) MONTHS. 04/06/19   [provider]  montelukast (SINGULAIR) 10 MG tablet Take 10 mg by mouth daily.     [provider]  predniSONE (DELTASONE) 50 MG tablet Take 1 tablet (50 mg total) by mouth daily with breakfast for 5 days. 06/15/19 06/20/19  , Junius Creamer, PA-C    Family History Family History  Problem Relation Age of Onset  . Asthma Mother   . Allergic rhinitis Mother     Social History Social History   Tobacco Use  . Smoking status: Never Smoker  . Smokeless tobacco: Never Used  Substance Use Topics  . Alcohol use: No  . Drug use: No     Allergies   Other  and Amoxicillin   Review of Systems Review of Systems  Constitutional: Negative for activity change, appetite change, chills, fatigue and fever.  HENT: Negative for congestion, ear pain, rhinorrhea, sinus pressure, sore throat and trouble swallowing.   Eyes: Negative for discharge and redness.  Respiratory: Positive for chest tightness, shortness of breath and wheezing. Negative for cough.   Cardiovascular: Negative for chest pain.  Gastrointestinal: Negative for abdominal pain, diarrhea, nausea and vomiting.  Musculoskeletal: Negative for myalgias.  Skin: Negative for rash.  Neurological: Negative for dizziness, light-headedness and headaches.     Physical Exam Triage  Vital Signs ED Triage Vitals  Enc Vitals Group     BP 06/15/19 1906 127/83     Pulse Rate 06/15/19 1906 (!) 115     Resp 06/15/19 1906 20     Temp 06/15/19 1906 98.9 F (37.2 C)     Temp Source 06/15/19 1906 Oral     SpO2 06/15/19 1906 97 %     Weight --      Height --      Head Circumference --      Peak Flow --      Pain Score 06/15/19 1941 5     Pain Loc --      Pain Edu? --      Excl. in [place]? --    No data found.  Updated Vital Signs BP 127/83 (BP Location: Left Arm)   Pulse (!) 115   Temp 98.9 F (37.2 C) (Oral)   Resp 20   SpO2 97%   Visual Acuity Right Eye Distance:   Left Eye Distance:   Bilateral Distance:    Right Eye Near:   Left Eye Near:    Bilateral Near:     Physical Exam Vitals signs and nursing note reviewed.  Constitutional:      General: She is not in acute distress.    Appearance: She is well-developed.  HENT:     Head: Normocephalic and atraumatic.     Ears:     Comments: Bilateral ears without tenderness to palpation of external auricle, tragus and mastoid, EAC's without erythema or swelling, TM's with good bony landmarks and cone of light. Non erythematous.     Mouth/Throat:     Comments: Oral mucosa pink and moist, no tonsillar enlargement or exudate. Posterior pharynx patent and nonerythematous, no uvula deviation or swelling. Normal phonation.  Eyes:     Conjunctiva/sclera: Conjunctivae normal.  Neck:     Musculoskeletal: Neck supple.  Cardiovascular:     Rate and Rhythm: Regular rhythm. Tachycardia present.     Heart sounds: No murmur.     Comments: Heart rate rechecked and was averaging around 110 Pulmonary:     Effort: Pulmonary effort is normal. No respiratory distress.     Breath sounds: Normal breath sounds.     Comments: Breathing comfortably at rest, CTABL, no wheezing, rales or other adventitious sounds auscultated Abdominal:     Palpations: Abdomen is soft.     Tenderness: There is no abdominal tenderness.  Skin:     General: Skin is warm and dry.  Neurological:     Mental Status: She is alert.      UC Treatments / Results  Labs (all labs ordered are listed, but only abnormal results are displayed) Labs Reviewed - No data to display  EKG   Radiology No results found.  Procedures Procedures (including critical care time)  Medications Ordered in UC Medications  methylPREDNISolone sodium succinate (SOLU-MEDROL)  125 mg/2 mL injection 125 mg (125 mg Intramuscular Given 06/15/19 1936)  methylPREDNISolone sodium succinate (SOLU-MEDROL) 125 mg/2 mL injection (has no administration in time range)    Initial Impression / Assessment and Plan / UC Course  I have reviewed the triage vital signs and the nursing notes.  Pertinent labs & imaging results that were available during my care of the patient were reviewed by me and considered in my medical decision making (see chart for details).     Patient with 2 days of asthma exacerbation.  Will provide Solu-Medrol IM prior to discharge and will continue on prednisone x5 days.  Refilled albuterol inhaler.  Discussed heart rate with patient and advised to continue to monitor, do not use albuterol if heart rate remaining elevated or feeling very jittery/shaky.  Do not suspect COVID at this time.  Continue to monitor,Discussed strict return precautions. Patient verbalized understanding and is agreeable with plan.  Final Clinical Impressions(s) / UC Diagnoses   Final diagnoses:  Mild intermittent asthma with exacerbation     Discharge Instructions     We gave you a shot of Solu-Medrol Begin prednisone daily for the next 5 days beginning in the morning, take with food Continue albuterol/breathing treatments, if heart rate remaining elevated, feeling jittery or shaky please hold off on further albuterol  Please monitor symptoms and follow-up if symptoms not resolving or worsening, developing difficulty breathing or heart racing   ED Prescriptions     Medication Sig Dispense Auth. Provider   albuterol (VENTOLIN HFA) 108 (90 Base) MCG/ACT inhaler Inhale 1-2 puffs into the lungs every 6 (six) hours as needed for wheezing or shortness of breath. 18 g ,  C, PA-C   predniSONE (DELTASONE) 50 MG tablet Take 1 tablet (50 mg total) by mouth daily with breakfast for 5 days. 5 tablet , [place] C, PA-C     PDMP not reviewed this encounter.   Janith Lima, PA-C 06/15/19 1944    Janith Lima, PA-C 06/15/19 1945

## 2019-06-15 NOTE — Discharge Instructions (Signed)
We gave you a shot of Solu-Medrol Begin prednisone daily for the next 5 days beginning in the morning, take with food Continue albuterol/breathing treatments, if heart rate remaining elevated, feeling jittery or shaky please hold off on further albuterol  Please monitor symptoms and follow-up if symptoms not resolving or worsening, developing difficulty breathing or heart racing

## 2019-06-15 NOTE — ED Triage Notes (Signed)
Pt reports shortness of breath and wheezing x 2 days. Pt is using Proventil inhaler. Pt states the inhaler relieve the symptoms just  x 30 min.

## 2020-04-22 ENCOUNTER — Ambulatory Visit
Admission: EM | Admit: 2020-04-22 | Discharge: 2020-04-22 | Disposition: A | Discharge status: Home / Self Care | Payer: Self-pay | Attending: Physician Assistant | Admitting: Physician Assistant

## 2020-04-22 ENCOUNTER — Other Ambulatory Visit: Payer: Self-pay

## 2020-04-22 DIAGNOSIS — Z1152 Encounter for screening for COVID-19: Secondary | ICD-10-CM

## 2020-04-22 DIAGNOSIS — M545 Low back pain, unspecified: Secondary | ICD-10-CM

## 2020-04-22 MED ORDER — TIZANIDINE HCL 2 MG PO TABS: 2.0000 mg | ORAL_TABLET | Freq: Three times a day (TID) | ORAL | 0 refills | Status: DC | PRN

## 2020-04-22 MED ORDER — MELOXICAM 7.5 MG PO TABS: 7.5000 mg | ORAL_TABLET | Freq: Every day | ORAL | 0 refills | Status: DC
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
## 2020-04-23 LAB — NOVEL CORONAVIRUS, NAA: SARS-CoV-2, NAA: DETECTED — AB

## 2020-04-23 LAB — SARS-COV-2, NAA 2 DAY TAT

## 2020-06-12 ENCOUNTER — Ambulatory Visit
Admission: EM | Admit: 2020-06-12 | Discharge: 2020-06-12 | Disposition: A | Discharge status: Home / Self Care | Payer: Self-pay | Attending: Emergency Medicine | Admitting: Emergency Medicine

## 2020-06-12 DIAGNOSIS — J069 Acute upper respiratory infection, unspecified: Secondary | ICD-10-CM

## 2020-06-12 DIAGNOSIS — J4521 Mild intermittent asthma with (acute) exacerbation: Secondary | ICD-10-CM

## 2020-06-12 MED ORDER — ALBUTEROL SULFATE HFA 108 (90 BASE) MCG/ACT IN AERS: 1.0000 | INHALATION_SPRAY | Freq: Four times a day (QID) | RESPIRATORY_TRACT | 0 refills | Status: DC | PRN

## 2020-06-12 MED ORDER — CETIRIZINE-PSEUDOEPHEDRINE ER 5-120 MG PO TB12: 1.0000 | ORAL_TABLET | Freq: Two times a day (BID) | ORAL | 0 refills | Status: DC

## 2020-06-12 MED ORDER — DEXAMETHASONE SODIUM PHOSPHATE 10 MG/ML IJ SOLN
10.0000 mg | Freq: Once | INTRAMUSCULAR | Status: AC
  Administered 2020-06-12: 10 mg via INTRAMUSCULAR

## 2020-06-12 MED ORDER — BENZONATATE 200 MG PO CAPS: 200.0000 mg | ORAL_CAPSULE | Freq: Three times a day (TID) | ORAL | 0 refills | Status: AC | PRN

## 2020-06-12 MED ORDER — PREDNISONE 50 MG PO TABS: 50.0000 mg | ORAL_TABLET | Freq: Every day | ORAL | 0 refills | Status: DC

## 2020-06-12 NOTE — Discharge Instructions (Signed)
We gave you a dose of Decadron today, continue with prednisone daily for 5 days-take with food in the morning Continue albuterol inhaler/nebulizers as needed for wheezing and shortness of breath Tessalon/benzonatate every 8 hours as needed for cough Continue Flonase, add in Zyrtec-D for further congestion relief. Rest and fluids  Follow-up if not improving or worsening

## 2020-06-12 NOTE — ED Provider Notes (Signed)
[facility]    CSN: [id number] Arrival date & time: 06/12/20  1129      History   Chief Complaint Chief Complaint  Patient presents with  . Shortness of Breath    HPI Gail Mathews is a 29 y.o. female presenting today for evaluation of cough congestion shortness of breath.  Patient reports that she developed cough congestion approximately 3 days ago.  Had Covid test previously which was negative.  Over the past 1 to 2 days she has developed increased wheezing and shortness of breath and feeling her asthma flared.  Denies any fevers.  Denies close sick contacts.  Using albuterol inhaler and nebulizers frequently.   HPI  Past Medical History:  Diagnosis Date  . Allergic rhinitis   . Asthma     Patient Active Problem List   Diagnosis Date Noted  . Asthma 06/08/2011    History reviewed. No pertinent surgical history.  OB History   No obstetric history on file.      Home Medications    Prior to Admission medications   Medication Sig Start Date End Date Taking? Authorizing Provider  albuterol (VENTOLIN HFA) 108 (90 Base) MCG/ACT inhaler Inhale 1-2 puffs into the lungs every 6 (six) hours as needed for wheezing or shortness of breath. 06/12/20   Devarius Nelles C, PA-C  benzonatate (TESSALON) 200 MG capsule Take 1 capsule (200 mg total) by mouth 3 (three) times daily as needed for up to 7 days for cough. 06/12/20 06/19/20  Min Collymore C, PA-C  cetirizine-pseudoephedrine (ZYRTEC-D) 5-120 MG tablet Take 1 tablet by mouth 2 (two) times daily. 06/12/20   Aidee Latimore C, PA-C  ibuprofen (ADVIL) 200 MG tablet Take 200 mg by mouth every 6 (six) hours as needed.    [provider]  medroxyPROGESTERone (DEPO-PROVERA) 150 MG/ML injection INJECT 1 ML (150 MG DOSE) INTO THE MUSCLE EVERY 3 (THREE) MONTHS. 04/06/19   [provider]  montelukast (SINGULAIR) 10 MG tablet Take 10 mg by mouth daily.     [provider]  predniSONE (DELTASONE)  50 MG tablet Take 1 tablet (50 mg total) by mouth daily with breakfast. 06/12/20   Patria Warzecha C, PA-C  cetirizine (ZYRTEC) 10 MG tablet Take 1 tablet (10 mg total) by mouth daily. 07/13/14 04/22/20  Charm Rings, MD  fluticasone (FLONASE) 50 MCG/ACT nasal spray Place 2 sprays into both nostrils daily. 10/15/18 04/22/20  Dahlia Byes A, NP  ipratropium (ATROVENT) 0.06 % nasal spray Place 2 sprays into both nostrils 4 (four) times daily. 05/03/18 04/22/20  Belinda Fisher, PA-C    Family History Family History  Problem Relation Age of Onset  . Asthma Mother   . Allergic rhinitis Mother     Social History Social History   Tobacco Use  . Smoking status: Never Smoker  . Smokeless tobacco: Never Used  Substance Use Topics  . Alcohol use: No  . Drug use: No     Allergies   Other and Amoxicillin   Review of Systems Review of Systems  Constitutional: Negative for activity change, appetite change, chills, fatigue and fever.  HENT: Positive for congestion and rhinorrhea. Negative for ear pain, sinus pressure, sore throat and trouble swallowing.   Eyes: Negative for discharge and redness.  Respiratory: Positive for cough, shortness of breath and wheezing. Negative for chest tightness.   Cardiovascular: Negative for chest pain.  Gastrointestinal: Negative for abdominal pain, diarrhea, nausea and vomiting.  Musculoskeletal: Negative for myalgias.  Skin: Negative for  rash.  Neurological: Negative for dizziness, light-headedness and headaches.     Physical Exam Triage Vital Signs ED Triage Vitals  Enc Vitals Group     BP 06/12/20 1352 127/81     Pulse Rate 06/12/20 1352 (!) 121     Resp 06/12/20 1352 20     Temp 06/12/20 1352 98.6 F (37 C)     Temp Source 06/12/20 1352 Oral     SpO2 06/12/20 1352 97 %     Weight --      Height --      Head Circumference --      Peak Flow --      Pain Score 06/12/20 1353 0     Pain Loc --      Pain Edu? --      Excl. in GC? --    No data  found.  Updated Vital Signs BP 127/81 (BP Location: Left Arm)   Pulse (!) 121   Temp 98.6 F (37 C) (Oral)   Resp 20   SpO2 97%   Visual Acuity Right Eye Distance:   Left Eye Distance:   Bilateral Distance:    Right Eye Near:   Left Eye Near:    Bilateral Near:     Physical Exam Vitals and nursing note reviewed.  Constitutional:      Appearance: She is well-developed.     Comments: No acute distress  HENT:     Head: Normocephalic and atraumatic.     Ears:     Comments: Bilateral ears without tenderness to palpation of external auricle, tragus and mastoid, EAC's without erythema or swelling, TM's with good bony landmarks and cone of light. Non erythematous.     Nose: Nose normal.     Mouth/Throat:     Comments: Oral mucosa pink and moist, no tonsillar enlargement or exudate. Posterior pharynx patent and nonerythematous, no uvula deviation or swelling. Normal phonation. Eyes:     Conjunctiva/sclera: Conjunctivae normal.  Cardiovascular:     Rate and Rhythm: Tachycardia present.  Pulmonary:     Effort: Pulmonary effort is normal. No respiratory distress.     Comments: Patient slightly tachypneic, audible wheezing noted prior to auscultation, inspiratory and expiratory wheezing throughout bilateral lung fields, no accessory muscle use Abdominal:     General: There is no distension.  Musculoskeletal:        General: Normal range of motion.     Cervical back: Neck supple.  Skin:    General: Skin is warm and dry.  Neurological:     Mental Status: She is alert and oriented to person, place, and time.      UC Treatments / Results  Labs (all labs ordered are listed, but only abnormal results are displayed) Labs Reviewed - No data to display  EKG   Radiology No results found.  Procedures Procedures (including critical care time)  Medications Ordered in UC Medications  dexamethasone (DECADRON) injection 10 mg (10 mg Intramuscular Given 06/12/20 1424)     Initial Impression / Assessment and Plan / UC Course  I have reviewed the triage vital signs and the nursing notes.  Pertinent labs & imaging results that were available during my care of the patient were reviewed by me and considered in my medical decision making (see chart for details).     Viral URI with asthma exacerbation.  Providing Decadron prior to discharge, continuing on prednisone x5 days, albuterol inhaler refilled, continue symptomatic and supportive care for other URI symptoms.  Rest and fluids.  Discussed strict return precautions. Patient verbalized understanding and is agreeable with plan.  Final Clinical Impressions(s) / UC Diagnoses   Final diagnoses:  Viral URI with cough  Mild intermittent asthma with acute exacerbation     Discharge Instructions     We gave you a dose of Decadron today, continue with prednisone daily for 5 days-take with food in the morning Continue albuterol inhaler/nebulizers as needed for wheezing and shortness of breath Tessalon/benzonatate every 8 hours as needed for cough Continue Flonase, add in Zyrtec-D for further congestion relief. Rest and fluids  Follow-up if not improving or worsening    ED Prescriptions    Medication Sig Dispense Auth. Provider   predniSONE (DELTASONE) 50 MG tablet Take 1 tablet (50 mg total) by mouth daily with breakfast. 5 tablet Jisella Ashenfelter C, PA-C   albuterol (VENTOLIN HFA) 108 (90 Base) MCG/ACT inhaler Inhale 1-2 puffs into the lungs every 6 (six) hours as needed for wheezing or shortness of breath. 18 g Dewell Monnier C, PA-C   cetirizine-pseudoephedrine (ZYRTEC-D) 5-120 MG tablet Take 1 tablet by mouth 2 (two) times daily. 30 tablet Tydus Sanmiguel C, PA-C   benzonatate (TESSALON) 200 MG capsule Take 1 capsule (200 mg total) by mouth 3 (three) times daily as needed for up to 7 days for cough. 28 capsule Jenay Morici, [place] C, PA-C     PDMP not reviewed this encounter.   Lew Dawes,  PA-C 06/12/20 1443

## 2020-06-12 NOTE — ED Triage Notes (Addendum)
Pt c/o SOB with productive cough since last sunday. States hx of asthma. States received her 1st vaccine on 10/3. Pt in no distress, speaking in complete sentence.
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
## 2021-05-15 ENCOUNTER — Ambulatory Visit (HOSPITAL_COMMUNITY)
Admission: EM | Admit: 2021-05-15 | Discharge: 2021-05-15 | Disposition: A | Discharge status: Home / Self Care | Payer: Medicaid Other | Attending: Student | Admitting: Student

## 2021-05-15 ENCOUNTER — Other Ambulatory Visit: Payer: Self-pay

## 2021-05-15 ENCOUNTER — Encounter (HOSPITAL_COMMUNITY): Payer: Self-pay

## 2021-05-15 DIAGNOSIS — S161XXA Strain of muscle, fascia and tendon at neck level, initial encounter: Secondary | ICD-10-CM

## 2021-05-15 MED ORDER — IBUPROFEN 600 MG PO TABS: 600.0000 mg | ORAL_TABLET | Freq: Four times a day (QID) | ORAL | 0 refills | Status: DC | PRN

## 2021-05-15 MED ORDER — TIZANIDINE HCL 2 MG PO TABS: 2.0000 mg | ORAL_TABLET | Freq: Three times a day (TID) | ORAL | 0 refills | Status: DC | PRN
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
## 2021-05-15 NOTE — Discharge Instructions (Addendum)
-  Start the muscle relaxer-Zanaflex (tizanidine), up to 3 times daily for muscle spasms and pain.  This can make you drowsy, so take at bedtime or when you do not need to drive or operate machinery. -You can take Tylenol up to 1000 mg 3 times daily, and ibuprofen up to 600 mg 3 times daily with food.  You can take these together, or alternate every 3-4 hours. -Heating pad  
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
## 2021-11-28 ENCOUNTER — Other Ambulatory Visit: Payer: Self-pay

## 2021-11-28 ENCOUNTER — Encounter (HOSPITAL_COMMUNITY): Payer: Self-pay

## 2021-11-28 ENCOUNTER — Emergency Department (HOSPITAL_COMMUNITY)
Admission: EM | Admit: 2021-11-28 | Discharge: 2021-11-28 | Disposition: A | Discharge status: Home / Self Care | Payer: Medicaid Other | Attending: Emergency Medicine | Admitting: Emergency Medicine

## 2021-11-28 DIAGNOSIS — Z79899 Other long term (current) drug therapy: Secondary | ICD-10-CM | POA: Insufficient documentation

## 2021-11-28 DIAGNOSIS — B349 Viral infection, unspecified: Secondary | ICD-10-CM

## 2021-11-28 DIAGNOSIS — M7918 Myalgia, other site: Secondary | ICD-10-CM | POA: Insufficient documentation

## 2021-11-28 DIAGNOSIS — R059 Cough, unspecified: Secondary | ICD-10-CM | POA: Insufficient documentation

## 2021-11-28 DIAGNOSIS — Z20822 Contact with and (suspected) exposure to covid-19: Secondary | ICD-10-CM | POA: Insufficient documentation

## 2021-11-28 DIAGNOSIS — R509 Fever, unspecified: Secondary | ICD-10-CM | POA: Insufficient documentation

## 2021-11-28 DIAGNOSIS — R0981 Nasal congestion: Secondary | ICD-10-CM | POA: Insufficient documentation

## 2021-11-28 LAB — RESP PANEL BY RT-PCR (FLU A&B, COVID) ARPGX2
Influenza A by PCR: NEGATIVE
Influenza B by PCR: NEGATIVE
SARS Coronavirus 2 by RT PCR: NEGATIVE

## 2021-11-28 MED ORDER — IBUPROFEN 800 MG PO TABS
800.0000 mg | ORAL_TABLET | Freq: Once | ORAL | Status: AC
  Administered 2021-11-28: 800 mg via ORAL
  Filled 2021-11-28: qty 1

## 2021-11-28 NOTE — ED Provider Notes (Signed)
?[facility]-EMERGENCY DEPT ?Provider Note ? ? ?CSN: [id number] ?Arrival date & time: 11/28/21  0327 ? ?  ? ?History ? ?Chief Complaint  ?Patient presents with  ? Fever  ? ? ?Gail Mathews is a 31 y.o. female. ? ?The history is provided by the patient.  ?Fever ?Max temp prior to arrival:  102 ?Temp source:  Oral ?Severity:  Moderate ?Onset quality:  Gradual ?Duration:  2 hours ?Timing:  Constant ?Progression:  Resolved ?Chronicity:  New ?Relieved by:  Acetaminophen ?Worsened by:  Nothing ?Ineffective treatments:  None tried ?Associated symptoms: congestion, cough and myalgias   ?Associated symptoms: no chest pain, no nausea and no vomiting   ?Risk factors: no recent sickness   ?Symptoms all started this evening  ?  ? ?Home Medications ?Prior to Admission medications   ?Medication Sig Start Date End Date Taking? Authorizing Provider  ?albuterol (VENTOLIN HFA) 108 (90 Base) MCG/ACT inhaler Inhale 1-2 puffs into the lungs every 6 (six) hours as needed for wheezing or shortness of breath. 06/12/20   Wieters, Hallie C, PA-C  ?cetirizine-pseudoephedrine (ZYRTEC-D) 5-120 MG tablet Take 1 tablet by mouth 2 (two) times daily. 06/12/20   Wieters, Hallie C, PA-C  ?ibuprofen (ADVIL) 600 MG tablet Take 1 tablet (600 mg total) by mouth every 6 (six) hours as needed. 05/15/21   Rhys Martini, PA-C  ?medroxyPROGESTERone (DEPO-PROVERA) 150 MG/ML injection INJECT 1 ML (150 MG DOSE) INTO THE MUSCLE EVERY 3 (THREE) MONTHS. 04/06/19   [provider]  ?montelukast (SINGULAIR) 10 MG tablet Take 10 mg by mouth daily.     [provider]  ?predniSONE (DELTASONE) 50 MG tablet Take 1 tablet (50 mg total) by mouth daily with breakfast. 06/12/20   Wieters, Hallie C, PA-C  ?tiZANidine (ZANAFLEX) 2 MG tablet Take 1 tablet (2 mg total) by mouth every 8 (eight) hours as needed for muscle spasms. 05/15/21   Rhys Martini, PA-C  ?cetirizine (ZYRTEC) 10 MG tablet Take 1 tablet (10 mg total) by mouth daily.  07/13/14 04/22/20  Charm Rings, MD  ?fluticasone (FLONASE) 50 MCG/ACT nasal spray Place 2 sprays into both nostrils daily. 10/15/18 04/22/20  Dahlia Byes A, NP  ?ipratropium (ATROVENT) 0.06 % nasal spray Place 2 sprays into both nostrils 4 (four) times daily. 05/03/18 04/22/20  Belinda Fisher, PA-C  ?   ? ?Allergies    ?Other and Amoxicillin   ? ?Review of Systems   ?Review of Systems  ?Constitutional:  Positive for fever.  ?HENT:  Positive for congestion.   ?Eyes:  Negative for photophobia.  ?Respiratory:  Positive for cough. Negative for shortness of breath.   ?Cardiovascular:  Negative for chest pain.  ?Gastrointestinal:  Negative for nausea and vomiting.  ?Musculoskeletal:  Positive for myalgias.  ?All other systems reviewed and are negative. ? ?Physical Exam ?Updated Vital Signs ?BP (!) 130/101 (BP Location: Left Arm)   Pulse (!) 120   Temp 98.1 ?F (36.7 ?C) (Oral)   Resp 15   Ht 5\' 9"  (1.753 m)   Wt 98.9 kg   SpO2 100%   BMI 32.19 kg/m?  ?Physical Exam ?Vitals and nursing note reviewed.  ?Constitutional:   ?   General: She is not in acute distress. ?   Appearance: Normal appearance.  ?HENT:  ?   Head: Normocephalic and atraumatic.  ?   Nose: Congestion present.  ?   Mouth/Throat:  ?   Mouth: Mucous membranes are moist.  ?Eyes:  ?   Conjunctiva/sclera: Conjunctivae  normal.  ?   Pupils: Pupils are equal, round, and reactive to light.  ?Cardiovascular:  ?   Rate and Rhythm: Normal rate and regular rhythm.  ?   Pulses: Normal pulses.  ?   Heart sounds: Normal heart sounds.  ?Pulmonary:  ?   Effort: Pulmonary effort is normal.  ?   Breath sounds: Normal breath sounds.  ?Abdominal:  ?   General: Bowel sounds are normal.  ?   Palpations: Abdomen is soft.  ?   Tenderness: There is no abdominal tenderness. There is no guarding.  ?Musculoskeletal:  ?   Cervical back: Normal range of motion and neck supple.  ?Skin: ?   General: Skin is warm and dry.  ?   Capillary Refill: Capillary refill takes less than 2 seconds.   ?Neurological:  ?   General: No focal deficit present.  ?   Mental Status: She is alert and oriented to person, place, and time.  ?   Deep Tendon Reflexes: Reflexes normal.  ?Psychiatric:     ?   Mood and Affect: Mood normal.     ?   Behavior: Behavior normal.  ? ? ?ED Results / Procedures / Treatments   ?Labs ?(all labs ordered are listed, but only abnormal results are displayed) ?Results for orders placed or performed during the hospital encounter of 11/28/21  ?Resp Panel by RT-PCR (Flu A&B, Covid) Nasopharyngeal Swab  ? Specimen: Nasopharyngeal Swab; Nasopharyngeal(NP) swabs in vial transport medium  ?Result Value Ref Range  ? SARS Coronavirus 2 by RT PCR NEGATIVE NEGATIVE  ? Influenza A by PCR NEGATIVE NEGATIVE  ? Influenza B by PCR NEGATIVE NEGATIVE  ? ?No results found. ? ?Radiology ?No results found. ? ?Procedures ?Procedures  ? ? ?Medications Ordered in ED ?Medications  ?ibuprofen (ADVIL) tablet 800 mg (800 mg Oral Given 11/28/21 0356)  ? ? ?ED Course/ Medical Decision Making/ A&P ?  ?                        ?Medical Decision Making ?URI symptoms tonight with cough and fever came in immediately for check  ? ?Amount and/or Complexity of Data Reviewed ?Labs: ordered. ?   Details: negative covid and flu by my reading ? ?Risk ?Prescription drug management. ?Risk Details: Exam is benign and reassuring.  Lungs are clear.  I do not believe this is PNA.  Alternate tylenol and ibuprofen. Symptoms consistent with viral illness. Strict return precautions given.   ? ? ? ?Final Clinical Impression(s) / ED Diagnoses ?Final diagnoses:  ?None  ? ?Return for intractable cough, coughing up blood, fevers > 100.4 unrelieved by medication, shortness of breath, intractable vomiting, chest pain, shortness of breath, weakness, numbness, changes in speech, facial asymmetry, abdominal pain, passing out, Inability to tolerate liquids or food, cough, altered mental status or any concerns. No signs of systemic illness or infection. The  patient is nontoxic-appearing on exam and vital signs are within normal limits.  ?I have reviewed the triage vital signs and the nursing notes. Pertinent labs & imaging results that were available during my care of the patient were reviewed by me and considered in my medical decision making (see chart for details). After history, exam, and medical workup I feel the patient has been appropriately medically screened and is safe for discharge home. Pertinent diagnoses were discussed with the patient. Patient was given return precautions.  ?  ?  ?Rx / DC Orders ?ED Discharge Orders   ? ?  None  ? ?  ? ? ?  ?Breeanna Galgano, MD ?11/28/21 0437 ? ?

## 2021-11-28 NOTE — ED Triage Notes (Signed)
Reports nonproductive cough, generalized body aches and a fever of 101.5. Sx all began around midnight this morning. Took 1000mg  tylenol at 230AM.  ?

## 2022-04-29 ENCOUNTER — Other Ambulatory Visit: Payer: Self-pay

## 2022-04-29 ENCOUNTER — Encounter (HOSPITAL_COMMUNITY): Payer: Self-pay

## 2022-04-29 ENCOUNTER — Emergency Department (HOSPITAL_COMMUNITY)
Admission: EM | Admit: 2022-04-29 | Discharge: 2022-04-29 | Disposition: A | Discharge status: Home / Self Care | Payer: Medicaid Other | Attending: Emergency Medicine | Admitting: Emergency Medicine

## 2022-04-29 DIAGNOSIS — Y92410 Unspecified street and highway as the place of occurrence of the external cause: Secondary | ICD-10-CM | POA: Insufficient documentation

## 2022-04-29 DIAGNOSIS — S0993XA Unspecified injury of face, initial encounter: Secondary | ICD-10-CM | POA: Diagnosis present

## 2022-04-29 DIAGNOSIS — S00511A Abrasion of lip, initial encounter: Secondary | ICD-10-CM | POA: Diagnosis not present

## 2022-04-29 MED ORDER — IBUPROFEN 200 MG PO TABS
600.0000 mg | ORAL_TABLET | Freq: Once | ORAL | Status: AC
  Administered 2022-04-29: 600 mg via ORAL
  Filled 2022-04-29: qty 3
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
## 2022-04-29 NOTE — Discharge Instructions (Signed)
Return for any problem.  ?
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
## 2022-05-01 ENCOUNTER — Ambulatory Visit: Payer: Medicaid Other

## 2022-05-01 DIAGNOSIS — M222X1 Patellofemoral disorders, right knee: Secondary | ICD-10-CM

## 2022-05-01 HISTORY — DX: Patellofemoral disorders, right knee: M22.2X1
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
## 2022-05-07 ENCOUNTER — Ambulatory Visit: Payer: No Typology Code available for payment source

## 2022-06-16 ENCOUNTER — Emergency Department (HOSPITAL_COMMUNITY)
Admission: EM | Admit: 2022-06-16 | Discharge: 2022-06-16 | Disposition: A | Discharge status: Home / Self Care | Payer: Medicaid Other | Attending: Emergency Medicine | Admitting: Emergency Medicine

## 2022-06-16 ENCOUNTER — Encounter (HOSPITAL_COMMUNITY): Payer: Self-pay

## 2022-06-16 ENCOUNTER — Emergency Department (HOSPITAL_COMMUNITY): Payer: Medicaid Other

## 2022-06-16 DIAGNOSIS — Z7951 Long term (current) use of inhaled steroids: Secondary | ICD-10-CM | POA: Insufficient documentation

## 2022-06-16 DIAGNOSIS — J4521 Mild intermittent asthma with (acute) exacerbation: Secondary | ICD-10-CM | POA: Insufficient documentation

## 2022-06-16 LAB — BASIC METABOLIC PANEL
Anion gap: 6 (ref 5–15)
BUN: 13 mg/dL (ref 6–20)
CO2: 23 mmol/L (ref 22–32)
Calcium: 9.1 mg/dL (ref 8.9–10.3)
Chloride: 108 mmol/L (ref 98–111)
Creatinine, Ser: 0.95 mg/dL (ref 0.44–1.00)
GFR, Estimated: >60 mL/min (ref >60)
Glucose, Bld: 109 mg/dL — ABNORMAL HIGH (ref 70–99)
Potassium: 3.5 mmol/L (ref 3.5–5.1)
Sodium: 137 mmol/L (ref 135–145)

## 2022-06-16 LAB — CBC
HCT: 39.5 % (ref 36.0–46.0)
Hemoglobin: 12.2 g/dL (ref 12.0–15.0)
MCH: 25.8 pg — ABNORMAL LOW (ref 26.0–34.0)
MCHC: 30.9 g/dL (ref 30.0–36.0)
MCV: 83.7 fL (ref 80.0–100.0)
Platelets: 371 10*3/uL (ref 150–400)
RBC: 4.72 MIL/uL (ref 3.87–5.11)
RDW: 14.3 % (ref 11.5–15.5)
WBC: 11.1 10*3/uL — ABNORMAL HIGH (ref 4.0–10.5)
nRBC: 0 % (ref 0.0–0.2)

## 2022-06-16 LAB — TROPONIN I (HIGH SENSITIVITY): Troponin I (High Sensitivity): 3 ng/L (ref <18)

## 2022-06-16 MED ORDER — ALBUTEROL SULFATE (2.5 MG/3ML) 0.083% IN NEBU: 2.5000 mg | INHALATION_SOLUTION | RESPIRATORY_TRACT | 0 refills | Status: DC | PRN

## 2022-06-16 MED ORDER — PREDNISONE 20 MG PO TABS: ORAL_TABLET | ORAL | 0 refills | Status: DC

## 2022-06-16 MED ORDER — PREDNISONE 20 MG PO TABS
60.0000 mg | ORAL_TABLET | Freq: Once | ORAL | Status: AC
  Administered 2022-06-16: 60 mg via ORAL
  Filled 2022-06-16: qty 3

## 2022-06-16 MED ORDER — IPRATROPIUM BROMIDE 0.02 % IN SOLN
0.5000 mg | Freq: Once | RESPIRATORY_TRACT | Status: AC
  Administered 2022-06-16: 0.5 mg via RESPIRATORY_TRACT
  Filled 2022-06-16: qty 2.5

## 2022-06-16 MED ORDER — ALBUTEROL SULFATE (2.5 MG/3ML) 0.083% IN NEBU
5.0000 mg | INHALATION_SOLUTION | Freq: Once | RESPIRATORY_TRACT | Status: AC
  Administered 2022-06-16: 5 mg via RESPIRATORY_TRACT
  Filled 2022-06-16: qty 6

## 2022-06-16 MED ORDER — IPRATROPIUM-ALBUTEROL 0.5-2.5 (3) MG/3ML IN SOLN
3.0000 mL | Freq: Once | RESPIRATORY_TRACT | Status: AC
  Administered 2022-06-16: 3 mL via RESPIRATORY_TRACT
  Filled 2022-06-16: qty 3

## 2022-06-16 NOTE — Discharge Instructions (Signed)
Take the prescribed medication as directed-- can do nebs every 4-6 hours.  If using more than 3 back to back without improvement, need to have re-evaluation. Follow-up with your primary care doctor. Return to the ED for new or worsening symptoms.

## 2022-06-16 NOTE — ED Triage Notes (Signed)
Pt states that she began to feel SOB about an hour ago, hx of asthma, audible wheezing, pt having CP, HR 140's took home inhaler PTA

## 2022-06-16 NOTE — ED Provider Notes (Signed)
[facility]-EMERGENCY DEPT Provider Note   CSN: [id number] Arrival date & time: 06/16/22  0110     History  Chief Complaint  Patient presents with   Shortness of Breath   Chest Pain    Yasira Engelson is a 31 y.o. female.  The history is provided by the patient and medical records.  Shortness of Breath Associated symptoms: chest pain   Chest Pain Associated symptoms: shortness of breath    31 year old female with history of asthma, presenting to the ED with shortness of breath.  States she feels like she is having an asthma flareup.  She reports this normally occurs around this time of year with change in weather, they also have been doing cleaning at her work and stirring up a lot of dust which she has known allergy to.  States she used treatment earlier this morning and used inhaler this evening but is not getting significant relief.  States she normally does better with additional neb treatments and prednisone.  She has not had any prior admissions or intubations for asthma in the past.  She has not had any cough, fever, or other infectious symptoms.  Home Medications Prior to Admission medications   Medication Sig Start Date End Date Taking? Authorizing Provider  albuterol (VENTOLIN HFA) 108 (90 Base) MCG/ACT inhaler Inhale 1-2 puffs into the lungs every 6 (six) hours as needed for wheezing or shortness of breath. 06/12/20   Wieters, Hallie C, PA-C  cetirizine-pseudoephedrine (ZYRTEC-D) 5-120 MG tablet Take 1 tablet by mouth 2 (two) times daily. 06/12/20   Wieters, Hallie C, PA-C  ibuprofen (ADVIL) 600 MG tablet Take 1 tablet (600 mg total) by mouth every 6 (six) hours as needed. 05/15/21   Rhys Martini, PA-C  medroxyPROGESTERone (DEPO-PROVERA) 150 MG/ML injection INJECT 1 ML (150 MG DOSE) INTO THE MUSCLE EVERY 3 (THREE) MONTHS. 04/06/19   [provider]  montelukast (SINGULAIR) 10 MG tablet Take 10 mg by mouth daily.     [provider]   predniSONE (DELTASONE) 50 MG tablet Take 1 tablet (50 mg total) by mouth daily with breakfast. 06/12/20   Wieters, Hallie C, PA-C  tiZANidine (ZANAFLEX) 2 MG tablet Take 1 tablet (2 mg total) by mouth every 8 (eight) hours as needed for muscle spasms. 05/15/21   Rhys Martini, PA-C  cetirizine (ZYRTEC) 10 MG tablet Take 1 tablet (10 mg total) by mouth daily. 07/13/14 04/22/20  Charm Rings, MD  fluticasone (FLONASE) 50 MCG/ACT nasal spray Place 2 sprays into both nostrils daily. 10/15/18 04/22/20  Dahlia Byes A, NP  ipratropium (ATROVENT) 0.06 % nasal spray Place 2 sprays into both nostrils 4 (four) times daily. 05/03/18 04/22/20  Belinda Fisher, PA-C      Allergies    Other and Amoxicillin    Review of Systems   Review of Systems  Respiratory:  Positive for shortness of breath.   Cardiovascular:  Positive for chest pain.  All other systems reviewed and are negative.   Physical Exam Updated Vital Signs BP 133/79   Pulse (!) 131   Temp 98.2 F (36.8 C) (Oral)   Resp 18   SpO2 97%   Physical Exam Vitals and nursing note reviewed.  Constitutional:      Appearance: She is well-developed.  HENT:     Head: Normocephalic and atraumatic.  Eyes:     Conjunctiva/sclera: Conjunctivae normal.     Pupils: Pupils are equal, round, and reactive to light.  Cardiovascular:  Rate and Rhythm: Regular rhythm. Tachycardia present.     Heart sounds: Normal heart sounds.  Pulmonary:     Effort: Pulmonary effort is normal.     Breath sounds: Wheezing present.     Comments: Expiratory wheezes throughout, able to speak in sentences without difficulty, O2 sats 97% during exam Abdominal:     General: Bowel sounds are normal.     Palpations: Abdomen is soft.  Musculoskeletal:        General: Normal range of motion.     Cervical back: Normal range of motion.  Skin:    General: Skin is warm and dry.  Neurological:     Mental Status: She is alert and oriented to person, place, and time.     ED  Results / Procedures / Treatments   Labs (all labs ordered are listed, but only abnormal results are displayed) Labs Reviewed  BASIC METABOLIC PANEL - Abnormal; Notable for the following components:      Result Value   Glucose, Bld 109 (*)    All other components within normal limits  CBC - Abnormal; Notable for the following components:   WBC 11.1 (*)    MCH 25.8 (*)    All other components within normal limits  TROPONIN I (HIGH SENSITIVITY)    EKG EKG Interpretation  Date/Time:  Sunday June 16 2022 01:19:17 EDT Ventricular Rate:  136 PR Interval:  79 QRS Duration: 80 QT Interval:  288 QTC Calculation: 434 R Axis:   80 Text Interpretation: Sinus tachycardia Borderline repolarization abnormality Rate is faster Confirmed by Paula Libra ([id number]) on 06/16/2022 1:24:16 AM  Radiology DG Chest 2 View  Result Date: 06/16/2022 CLINICAL DATA:  Chest pain, shortness of breath EXAM: CHEST - 2 VIEW COMPARISON:  06/14/2014 FINDINGS: The heart size and mediastinal contours are within normal limits. Both lungs are clear. The visualized skeletal structures are unremarkable. IMPRESSION: Negative. Electronically Signed   By: Charlett Nose M.D.   On: 06/16/2022 01:38    Procedures Procedures    Medications Ordered in ED Medications  albuterol (PROVENTIL) (2.5 MG/3ML) 0.083% nebulizer solution 5 mg (5 mg Nebulization Given 06/16/22 0228)  ipratropium (ATROVENT) nebulizer solution 0.5 mg (0.5 mg Nebulization Given 06/16/22 0228)  predniSONE (DELTASONE) tablet 60 mg (60 mg Oral Given 06/16/22 0227)  ipratropium-albuterol (DUONEB) 0.5-2.5 (3) MG/3ML nebulizer solution 3 mL (3 mLs Nebulization Given 06/16/22 0330)    ED Course/ Medical Decision Making/ A&P                           Medical Decision Making Amount and/or Complexity of Data Reviewed Labs: ordered. Radiology: ordered and independent interpretation performed. ECG/medicine tests: ordered and independent interpretation  performed.  Risk Prescription drug management.   31 y.o. F here with SOB.  Hx of asthma, usually has issues this time of year.  They were also cleaning with lots of dust flying around at work-- known allergy to dust.  Afebrile, non-toxic.  Does have expiratory wheezes but able to speak in full sentences without difficulty.  EKG sinus tach but no acute ischemia.  Labs pending along with CXR.  Given dose of prednisone, albuterol, atrovent.  Will reassess.  3:17 AM Patient reports she is feeling better.  Does still have some expiratory wheezes.  Will give additional neb.  Labs reassuring, CXR clear.  Will reassess after additional neb.  Feeling better after additional neb.  Still somewhat tachycardic but suspect this is from albuterol.  States she feels ready to go home.  Will continue nebs PRN, prednisone taper.  Work note given.  Encouraged to follow-up with PCP.  Return here for new concerns.    Final Clinical Impression(s) / ED Diagnoses Final diagnoses:  Mild intermittent asthma with acute exacerbation    Rx / DC Orders ED Discharge Orders          Ordered     06/16/22 0430     06/16/22 0430    predniSONE (DELTASONE) 20 MG tablet        06/16/22 0451    albuterol (PROVENTIL) (2.5 MG/3ML) 0.083% nebulizer solution  Every 4 hours PRN        06/16/22 0451              Larene Pickett, PA-C 06/16/22 0456    Molpus, Jenny Reichmann, MD 06/16/22 [phone]

## 2022-10-14 ENCOUNTER — Other Ambulatory Visit: Payer: Self-pay | Admitting: Nurse Practitioner

## 2022-10-14 DIAGNOSIS — J069 Acute upper respiratory infection, unspecified: Secondary | ICD-10-CM

## 2022-10-14 DIAGNOSIS — J4521 Mild intermittent asthma with (acute) exacerbation: Secondary | ICD-10-CM

## 2022-10-14 MED ORDER — ALBUTEROL SULFATE HFA 108 (90 BASE) MCG/ACT IN AERS: 2.0000 | INHALATION_SPRAY | Freq: Four times a day (QID) | RESPIRATORY_TRACT | 0 refills | Status: DC | PRN

## 2022-10-14 MED ORDER — FLUTICASONE PROPIONATE 50 MCG/ACT NA SUSP: 2.0000 | Freq: Every day | NASAL | 6 refills | Status: DC

## 2022-10-14 MED ORDER — BENZONATATE 100 MG PO CAPS: 100.0000 mg | ORAL_CAPSULE | Freq: Three times a day (TID) | ORAL | 0 refills | Status: DC | PRN

## 2022-10-14 MED ORDER — PREDNISONE 10 MG (21) PO TBPK: ORAL_TABLET | ORAL | 0 refills | Status: DC

## 2022-10-14 NOTE — Progress Notes (Signed)
Rx transfer from [facility]

## 2022-10-31 ENCOUNTER — Telehealth: Payer: Medicaid Other | Admitting: Physician Assistant

## 2022-10-31 ENCOUNTER — Ambulatory Visit (HOSPITAL_COMMUNITY)
Admission: EM | Admit: 2022-10-31 | Discharge: 2022-10-31 | Disposition: A | Discharge status: Home / Self Care | Payer: Medicaid Other | Attending: Family Medicine | Admitting: Family Medicine

## 2022-10-31 ENCOUNTER — Encounter (HOSPITAL_COMMUNITY): Payer: Self-pay

## 2022-10-31 DIAGNOSIS — R509 Fever, unspecified: Secondary | ICD-10-CM

## 2022-10-31 DIAGNOSIS — R051 Acute cough: Secondary | ICD-10-CM

## 2022-10-31 DIAGNOSIS — R6889 Other general symptoms and signs: Secondary | ICD-10-CM

## 2022-10-31 DIAGNOSIS — U071 COVID-19: Secondary | ICD-10-CM | POA: Insufficient documentation

## 2022-10-31 DIAGNOSIS — J069 Acute upper respiratory infection, unspecified: Secondary | ICD-10-CM

## 2022-10-31 MED ORDER — ALBUTEROL SULFATE (2.5 MG/3ML) 0.083% IN NEBU: 2.5000 mg | INHALATION_SOLUTION | RESPIRATORY_TRACT | 0 refills | Status: DC | PRN

## 2022-10-31 MED ORDER — IBUPROFEN 800 MG PO TABS: 800.0000 mg | ORAL_TABLET | Freq: Three times a day (TID) | ORAL | 0 refills | Status: DC

## 2022-10-31 MED ORDER — MONTELUKAST SODIUM 10 MG PO TABS: 10.0000 mg | ORAL_TABLET | Freq: Every day | ORAL | 0 refills | Status: DC

## 2022-10-31 MED ORDER — ACETAMINOPHEN 325 MG PO TABS
ORAL_TABLET | ORAL | Status: AC
  Filled 2022-10-31: qty 3

## 2022-10-31 MED ORDER — ALBUTEROL SULFATE HFA 108 (90 BASE) MCG/ACT IN AERS: 2.0000 | INHALATION_SPRAY | RESPIRATORY_TRACT | 0 refills | Status: DC | PRN

## 2022-10-31 MED ORDER — FLUTICASONE PROPIONATE 50 MCG/ACT NA SUSP: 2.0000 | Freq: Every day | NASAL | 0 refills | Status: DC

## 2022-10-31 MED ORDER — ACETAMINOPHEN 325 MG PO TABS
975.0000 mg | ORAL_TABLET | Freq: Once | ORAL | Status: AC
  Administered 2022-10-31: 975 mg via ORAL

## 2022-10-31 NOTE — ED Provider Notes (Addendum)
[facility]    CSN: [id number] Arrival date & time: 10/31/22  1523      History   Chief Complaint Chief Complaint  Patient presents with   Covid Positive    HPI Gail Mathews is a 32 y.o. female who presents with onset of  chills, fever, sacral back aching since this am. Has mild nose congestion and cough which started today. Has not been sick at all in the past few weeks. Denies SOB She works as a [employer]ar[employer] manager and has been around + covid and flu and RSV patients. She needs refills of her Albuterol and allergy meds. Has had 2 covid shots.      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 No pertinent surgical history.  OB History   No obstetric history on file.      Home Medications    Prior to Admission medications   Medication Sig Start Date End Date Taking? Authorizing Provider  albuterol (VENTOLIN HFA) 108 (90 Base) MCG/ACT inhaler Inhale 2 puffs into the lungs every 4 (four) hours as needed for wheezing or shortness of breath. 10/31/22  Yes Rodriguez-Southworth, Sunday Spillers, PA-C  ibuprofen (ADVIL) 800 MG tablet Take 1 tablet (800 mg total) by mouth 3 (three) times daily. 10/31/22  Yes Rodriguez-Southworth, Sunday Spillers, PA-C  albuterol (PROVENTIL) (2.5 MG/3ML) 0.083% nebulizer solution Take 3 mLs (2.5 mg total) by nebulization every 4 (four) hours as needed for wheezing or shortness of breath. 10/31/22   Rodriguez-Southworth, Sunday Spillers, PA-C  cetirizine-pseudoephedrine (ZYRTEC-D) 5-120 MG tablet Take 1 tablet by mouth 2 (two) times daily. 06/12/20   Wieters, Hallie C, PA-C  fluticasone (FLONASE) 50 MCG/ACT nasal spray Place 2 sprays into both nostrils daily. 10/31/22   Rodriguez-Southworth, Sunday Spillers, PA-C  medroxyPROGESTERone (DEPO-PROVERA) 150 MG/ML injection INJECT 1 ML (150 MG DOSE) INTO THE MUSCLE EVERY 3  (THREE) MONTHS. 04/06/19   [provider]  montelukast (SINGULAIR) 10 MG tablet Take 1 tablet (10 mg total) by mouth daily. 10/31/22   Rodriguez-Southworth, Sunday Spillers, PA-C  cetirizine (ZYRTEC) 10 MG tablet Take 1 tablet (10 mg total) by mouth daily. 07/13/14 04/22/20  Melony Overly, MD  ipratropium (ATROVENT) 0.06 % nasal spray Place 2 sprays into both nostrils 4 (four) times daily. 05/03/18 04/22/20  Ok Edwards, PA-C    Family History Family History  Problem Relation Age of Onset   Asthma Mother    Allergic rhinitis Mother    Diabetes Father     Social History Social History   Tobacco Use   Smoking status: Never   Smokeless tobacco: Never  Substance Use Topics   Alcohol use: No   Drug use: No     Allergies   Other and Amoxicillin   Review of Systems Review of Systems  HENT:  Negative for ear pain and sore throat.   Gastrointestinal:  Negative for abdominal pain, diarrhea, nausea and vomiting.  Genitourinary:  Negative for dysuria.   And as noted in HPI  Physical Exam Triage Vital Signs ED Triage Vitals  Enc Vitals Group     BP 10/31/22 1627 134/74     Pulse Rate 10/31/22 1627 (!) 130     Resp 10/31/22 1627 19     Temp 10/31/22 1627 (!) 102.1 F (38.9 C)     Temp src --  SpO2 10/31/22 1627 97 %     Weight --      Height --      Head Circumference --      Peak Flow --      Pain Score 10/31/22 1626 10     Pain Loc --      Pain Edu? --      Excl. in [place]? --    No data found.  Updated Vital Signs BP 134/74   Pulse (!) 130   Temp (!) 102.1 F (38.9 C)   Resp 19   SpO2 97%   Visual Acuity Right Eye Distance:   Left Eye Distance:   Bilateral Distance:    Right Eye Near:   Left Eye Near:    Bilateral Near:      Physical Exam Vitals signs and nursing note reviewed.  Constitutional:      General: She is not in acute distress.    Appearance: Normal appearance. She is  ill-appearing, but toxic-appearing or diaphoretic.  HENT:     Head:  Normocephalic.     Right Ear: Tympanic membrane, ear canal and external ear normal.     Left Ear: Tympanic membrane, ear canal and external ear normal.     Nose: Nose normal.     Mouth/Throat:     Mouth: Mucous membranes are moist.  Eyes:     General: No scleral icterus.       Right eye: No discharge.        Left eye: No discharge.     Conjunctiva/sclera: Conjunctivae normal.  Neck:     Musculoskeletal: Neck supple. No neck rigidity.  Cardiovascular:     Rate and Rhythm: Normal rate and regular rhythm.     Heart sounds: No murmur.  Pulmonary:     Effort: Pulmonary effort is normal.     Breath sounds: has mild wheezing on RUP, but the rest is normal.  Abdominal:     General: Bowel sounds are normal. There is no distension.     Palpations: Abdomen is soft. There is no mass.     Tenderness: There is no abdominal tenderness. There is no guarding or rebound.     Hernia: No hernia is present.  Musculoskeletal: Normal range of motion.  Lymphadenopathy:     Cervical: No cervical adenopathy.  Skin:    General: Skin is warm and dry.     Coloration: Skin is not jaundiced.     Findings: No rash.  Neurological:     Mental Status: She is alert and oriented to person, place, and time.     Gait: Gait normal.  Psychiatric:        Mood and Affect: Mood normal.        Behavior: Behavior normal.        Thought Content: Thought content normal.        Judgment: Judgment normal.    UC Treatments / Results  Labs (all labs ordered are listed, but only abnormal results are displayed) Labs Reviewed  SARS CORONAVIRUS 2 (TAT 6-24 HRS)    EKG   Radiology No results found.  Procedures Procedures (including critical care time)  Medications Ordered in UC Medications  acetaminophen (TYLENOL) tablet 975 mg (975 mg Oral Given 10/31/22 1631)    Initial Impression / Assessment and Plan / UC Course  I have reviewed the triage vital signs and the nursing notes.  Flu like illness History of  asthma  We are out of Flu tests,  so she was explained that if the covid test was negative, she may go to another urgent care to have a FLU test.   If positive, due to history of asthma, she will be placed on Molnupiravir since we dont have any recent BMP.   I refilled her Albuterol inhaler and ampule's for her nebs, and her allergy meds as noted. Educated if she has Covid she needs to quarantine for 5 days, and may return to work with a mask for the other 5 days.  I prescribed her Ibuprofen which is what has helped her in the past for aches.  Final Clinical Impressions(s) / UC Diagnoses   Final diagnoses:  Fever, unspecified  Acute cough  Flu-like symptoms   Discharge Instructions   None    ED Prescriptions     Medication Sig Dispense Auth. Provider   ibuprofen (ADVIL) 800 MG tablet Take 1 tablet (800 mg total) by mouth 3 (three) times daily. 30 tablet Rodriguez-Southworth, Lysha Schrade, PA-C   fluticasone (FLONASE) 50 MCG/ACT nasal spray Place 2 sprays into both nostrils daily. 18.2 g Rodriguez-Southworth, Jaan Fischel, PA-C   montelukast (SINGULAIR) 10 MG tablet Take 1 tablet (10 mg total) by mouth daily. 30 tablet Rodriguez-Southworth, Sunday Spillers, PA-C   albuterol (PROVENTIL) (2.5 MG/3ML) 0.083% nebulizer solution Take 3 mLs (2.5 mg total) by nebulization every 4 (four) hours as needed for wheezing or shortness of breath. 75 mL Rodriguez-Southworth, Sunday Spillers, PA-C   albuterol (VENTOLIN HFA) 108 (90 Base) MCG/ACT inhaler Inhale 2 puffs into the lungs every 4 (four) hours as needed for wheezing or shortness of breath. 18 g Rodriguez-Southworth, Sunday Spillers, PA-C      PDMP not reviewed this encounter.   Shelby Mattocks, PA-C 10/31/22 1658    Rodriguez-Southworth, Herman, PA-C 10/31/22 1659

## 2022-10-31 NOTE — ED Triage Notes (Signed)
Pt tested positive for covid. Reports symptoms started this morning patient complaining of fever, cold chills, and back pain.

## 2022-10-31 NOTE — Discharge Instructions (Addendum)
If your covid test is negative, go to another urgent care to have the Flu test done. If positive quarantine for 5 days, then wear a mask for 5 more days.  Use your inhaler every 4-6 hours if your cough gets bad.   We will call you if the covid test is positive

## 2022-11-01 LAB — SARS CORONAVIRUS 2 (TAT 6-24 HRS): SARS Coronavirus 2: POSITIVE — AB

## 2022-11-19 NOTE — Progress Notes (Signed)
Appt canceled by pt.

## 2022-12-27 ENCOUNTER — Telehealth: Payer: Self-pay | Admitting: Physician Assistant

## 2022-12-27 ENCOUNTER — Telehealth: Payer: Self-pay

## 2022-12-27 ENCOUNTER — Telehealth: Payer: Self-pay | Admitting: Emergency Medicine

## 2022-12-27 DIAGNOSIS — U071 COVID-19: Secondary | ICD-10-CM

## 2022-12-27 NOTE — Patient Instructions (Signed)
  Gail Mathews, thank you for joining [name], PA-C for today's virtual visit.  While this provider is not your primary care provider (PCP), if your PCP is located in our provider database this encounter information will be shared with them immediately following your visit.   A [facility] MyChart account gives you access to today's visit and all your visits, tests, and labs performed at [facility] " click here if you don't have a [facility] MyChart account or go to [email or web address]  Consent: (Patient) Gail Mathews provided verbal consent for this virtual visit at the beginning of the encounter.  Current Medications:  Current Outpatient Medications:    albuterol (PROVENTIL) (2.5 MG/3ML) 0.083% nebulizer solution, Take 3 mLs (2.5 mg total) by nebulization every 4 (four) hours as needed for wheezing or shortness of breath., Disp: 75 mL, Rfl: 0   albuterol (VENTOLIN HFA) 108 (90 Base) MCG/ACT inhaler, Inhale 2 puffs into the lungs every 4 (four) hours as needed for wheezing or shortness of breath., Disp: 18 g, Rfl: 0   cetirizine-pseudoephedrine (ZYRTEC-D) 5-120 MG tablet, Take 1 tablet by mouth 2 (two) times daily., Disp: 30 tablet, Rfl: 0   fluticasone (FLONASE) 50 MCG/ACT nasal spray, Place 2 sprays into both nostrils daily., Disp: 18.2 g, Rfl: 0   ibuprofen (ADVIL) 800 MG tablet, Take 1 tablet (800 mg total) by mouth 3 (three) times daily., Disp: 30 tablet, Rfl: 0   medroxyPROGESTERone (DEPO-PROVERA) 150 MG/ML injection, INJECT 1 ML (150 MG DOSE) INTO THE MUSCLE EVERY 3 (THREE) MONTHS., Disp: , Rfl:    montelukast (SINGULAIR) 10 MG tablet, Take 1 tablet (10 mg total) by mouth daily., Disp: 30 tablet, Rfl: 0   Medications ordered in this encounter:  No orders of the defined types were placed in this encounter.    *If you need refills on other medications prior to your next appointment, please contact your pharmacy*  Follow-Up: Call back or seek an  in-person evaluation if the symptoms worsen or if the condition fails to improve as anticipated.  [facility] [phone]  Other Instructions    If you have been instructed to have an in-person evaluation today at a local Urgent Care facility, please use the link below. It will take you to a list of all of our available [facility], including address, phone number and hours of operation. Please do not delay care.  [facility]  If you or a family member do not have a primary care provider, use the link below to schedule a visit and establish care. When you choose a [facility] primary care physician or advanced practice provider, you gain a long-term partner in health. Find a Primary Care Provider  Learn more about 's in-office and virtual care options:  - Get Care Now

## 2022-12-27 NOTE — Progress Notes (Signed)
The patient no-showed for appointment despite this provider sending direct link, reaching out via phone with no response and waiting for at least 10 minutes from appointment time for patient to join. They will be marked as a NS for this appointment/time.   Sharen Youngren M Austina Constantin, PA-C    

## 2022-12-27 NOTE — Progress Notes (Unsigned)
Virtual Visit Consent   Gail Mathews, you are scheduled for a virtual visit with a [facility] provider today. Just as with appointments in the office, your consent must be obtained to participate. Your consent will be active for this visit and any virtual visit you may have with one of our providers in the next 365 days. If you have a MyChart account, a copy of this consent can be sent to you electronically.  As this is a virtual visit, video technology does not allow for your provider to perform a traditional examination. This may limit your provider's ability to fully assess your condition. If your provider identifies any concerns that need to be evaluated in person or the need to arrange testing (such as labs, EKG, etc.), we will make arrangements to do so. Although advances in technology are sophisticated, we cannot ensure that it will always work on either your end or our end. If the connection with a video visit is poor, the visit may have to be switched to a telephone visit. With either a video or telephone visit, we are not always able to ensure that we have a secure connection.  By engaging in this virtual visit, you consent to the provision of healthcare and authorize for your insurance to be billed (if applicable) for the services provided during this visit. Depending on your insurance coverage, you may receive a charge related to this service.  I need to obtain your verbal consent now. Are you willing to proceed with your visit today? Gail Mathews has provided verbal consent on 12/27/2022 for a virtual visit (video or telephone). Gail Horseman, PA-C  Date: 12/27/2022 11:26 AM  Virtual Visit via Video Note   I, Gail Mathews, connected with  Gail Mathews  ([id number], July 06, 1991) on 12/27/22 at 11:15 AM EDT by a video-enabled telemedicine application and verified that I am speaking with the correct person using two identifiers.  Location: Patient: Virtual Visit Location  Patient: Home Provider: Virtual Visit Location Provider: Home Office   I discussed the limitations of evaluation and management by telemedicine and the availability of in person appointments. The patient expressed understanding and agreed to proceed.    History of Present Illness: Gail Mathews is a 32 y.o. who identifies as a female who was assigned female at birth, and is being seen today for COVID.  States that she had a home COVID test that was positive. States that she had a fever yesterday to 102.  States that she has had slight cough.  Reports associated myalgias.  Denies vomiting.  Reports associated headache.  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   Asthma 06/08/2011    Allergies:  Allergies  Allergen Reactions   Other Swelling    Pecans/walnuts Pecans/walnuts   Amoxicillin Rash   Medications:  Current Outpatient Medications:    albuterol (PROVENTIL) (2.5 MG/3ML) 0.083% nebulizer solution, Take 3 mLs (2.5 mg total) by nebulization every 4 (four) hours as needed for wheezing or shortness of breath., Disp: 75 mL, Rfl: 0   albuterol (VENTOLIN HFA) 108 (90 Base) MCG/ACT inhaler, Inhale 2 puffs into the lungs every 4 (four) hours as needed for wheezing or shortness of breath., Disp: 18 g, Rfl: 0   cetirizine-pseudoephedrine (ZYRTEC-D) 5-120 MG tablet, Take 1 tablet by mouth 2 (two) times daily., Disp: 30 tablet, Rfl: 0   fluticasone (FLONASE) 50 MCG/ACT nasal spray, Place 2 sprays into both  nostrils daily., Disp: 18.2 g, Rfl: 0   ibuprofen (ADVIL) 800 MG tablet, Take 1 tablet (800 mg total) by mouth 3 (three) times daily., Disp: 30 tablet, Rfl: 0   medroxyPROGESTERone (DEPO-PROVERA) 150 MG/ML injection, INJECT 1 ML (150 MG DOSE) INTO THE MUSCLE EVERY 3 (THREE) MONTHS., Disp: , Rfl:    montelukast (SINGULAIR) 10 MG tablet, Take 1 tablet (10 mg total) by mouth daily., Disp: 30 tablet, Rfl:  0  Observations/Objective: Patient is well-developed, well-nourished in no acute distress.  Resting comfortably at home.  Head is normocephalic, atraumatic.  No labored breathing.  Speech is clear and coherent with logical content.  Patient is alert and oriented at baseline.    Assessment and Plan: 1. COVID-19  Patient needs work note for COVID 19.  Discussed supportive care options.  Patient appears well.  Don't think she needs referral for in-person treatment at this time.  Work note sent to [employer].  Follow Up Instructions: I discussed the assessment and treatment plan with the patient. The patient was provided an opportunity to ask questions and all were answered. The patient agreed with the plan and demonstrated an understanding of the instructions.  A copy of instructions were sent to the patient via MyChart unless otherwise noted below.     The patient was advised to call back or seek an in-person evaluation if the symptoms worsen or if the condition fails to improve as anticipated.  Time:  I spent 11 minutes with the patient via telehealth technology discussing the above problems/concerns.    Gail Horseman, PA-C

## 2023-01-04 ENCOUNTER — Telehealth: Payer: Self-pay | Admitting: Nurse Practitioner

## 2023-01-04 DIAGNOSIS — J4521 Mild intermittent asthma with (acute) exacerbation: Secondary | ICD-10-CM

## 2023-01-04 MED ORDER — PREDNISONE 20 MG PO TABS: 40.0000 mg | ORAL_TABLET | Freq: Every day | ORAL | 0 refills | Status: AC

## 2023-01-04 MED ORDER — BUDESONIDE-FORMOTEROL FUMARATE 80-4.5 MCG/ACT IN AERO: 2.0000 | INHALATION_SPRAY | Freq: Two times a day (BID) | RESPIRATORY_TRACT | 3 refills | Status: DC

## 2023-01-04 NOTE — Progress Notes (Signed)
Virtual Visit Consent   Gail Mathews, you are scheduled for a virtual visit with Gail Daphine Deutscher, FNP, a [facility] provider, today.     Just as with appointments in the office, your consent must be obtained to participate.  Your consent will be active for this visit and any virtual visit you may have with one of our providers in the next 365 days.     If you have a MyChart account, a copy of this consent can be sent to you electronically.  All virtual visits are billed to your insurance company just like a traditional visit in the office.    As this is a virtual visit, video technology does not allow for your provider to perform a traditional examination.  This may limit your provider's ability to fully assess your condition.  If your provider identifies any concerns that need to be evaluated in person or the need to arrange testing (such as labs, EKG, etc.), we will make arrangements to do so.     Although advances in technology are sophisticated, we cannot ensure that it will always work on either your end or our end.  If the connection with a video visit is poor, the visit may have to be switched to a telephone visit.  With either a video or telephone visit, we are not always able to ensure that we have a secure connection.     I need to obtain your verbal consent now.   Are you willing to proceed with your visit today? YES   Kairie Luker has provided verbal consent on 01/04/2023 for a virtual visit (video or telephone).   Gail Daphine Deutscher, FNP   Date: 01/04/2023 9:29 AM   Virtual Visit via Video Note   I, Gail Mathews, connected with Gail Mathews ([id number], 1990-12-21) on 01/04/23 at  9:30 AM EDT by a video-enabled telemedicine application and verified that I am speaking with the correct person using two identifiers.  Location: Patient: Virtual Visit Location Patient: Home Provider: Virtual Visit Location Provider: Mobile   I discussed the limitations of  evaluation and management by telemedicine and the availability of in person appointments. The patient expressed understanding and agreed to proceed.    History of Present Illness: Gail Mathews is a 32 y.o. who identifies as a female who was assigned female at birth, and is being seen today for asthma .  HPI: Patient had covid a couple of weeks ago. This has flared up her asthma.   Asthma She complains of cough, shortness of breath and wheezing. This is a recurrent problem. The current episode started 1 to 4 weeks ago. The problem occurs intermittently. The problem has been gradually worsening. The cough is hoarse, non-productive and dry. Associated symptoms include dyspnea on exertion and rhinorrhea. Pertinent negatives include no ear congestion, ear pain or trouble swallowing. Exacerbated by: albuterol inhaler. Her symptoms are alleviated by OTC cough suppressant. She reports minimal improvement on treatment. Her past medical history is significant for asthma.    Review of Systems  HENT:  Positive for rhinorrhea. Negative for ear pain and trouble swallowing.   Respiratory:  Positive for cough, shortness of breath and wheezing.   Cardiovascular:  Positive for dyspnea on exertion.    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   Asthma 06/08/2011    Allergies:  Allergies  Allergen Reactions   Other Swelling    Pecans/walnuts Pecans/walnuts   Amoxicillin  Rash   Medications:  Current Outpatient Medications:    albuterol (PROVENTIL) (2.5 MG/3ML) 0.083% nebulizer solution, Take 3 mLs (2.5 mg total) by nebulization every 4 (four) hours as needed for wheezing or shortness of breath., Disp: 75 mL, Rfl: 0   albuterol (VENTOLIN HFA) 108 (90 Base) MCG/ACT inhaler, Inhale 2 puffs into the lungs every 4 (four) hours as needed for wheezing or shortness of breath., Disp: 18 g, Rfl: 0   cetirizine-pseudoephedrine (ZYRTEC-D)  5-120 MG tablet, Take 1 tablet by mouth 2 (two) times daily., Disp: 30 tablet, Rfl: 0   fluticasone (FLONASE) 50 MCG/ACT nasal spray, Place 2 sprays into both nostrils daily., Disp: 18.2 g, Rfl: 0   ibuprofen (ADVIL) 800 MG tablet, Take 1 tablet (800 mg total) by mouth 3 (three) times daily., Disp: 30 tablet, Rfl: 0   medroxyPROGESTERone (DEPO-PROVERA) 150 MG/ML injection, INJECT 1 ML (150 MG DOSE) INTO THE MUSCLE EVERY 3 (THREE) MONTHS., Disp: , Rfl:    montelukast (SINGULAIR) 10 MG tablet, Take 1 tablet (10 mg total) by mouth daily., Disp: 30 tablet, Rfl: 0  Observations/Objective: Patient is well-developed, well-nourished in no acute distress.  Resting comfortably  at home.  Head is normocephalic, atraumatic.  No labored breathing.  Speech is clear and coherent with logical content.  Patient is alert and oriented at baseline.  Deep cough noted  Assessment and Plan:  Gail Mathews in today with chief complaint of Asthma   1. Mild intermittent asthma with acute exacerbation 1. Take meds as prescribed 2. Use a cool mist humidifier especially during the winter months and when heat has been humid. 3. Use saline nose sprays frequently 4. Saline irrigations of the nose can be very helpful if done frequently.  * 4X daily for 1 week*  * Use of a nettie pot can be helpful with this. Follow directions with this* 5. Drink plenty of fluids 6. Keep thermostat turn down low 7.For any cough or congestion- mucinex 8. For fever or aces or pains- take tylenol or ibuprofen appropriate for age and weight.  * for fevers greater than 101 orally you may alternate ibuprofen and tylenol every  3 hours.    - budesonide-formoterol (SYMBICORT) 80-4.5 MCG/ACT inhaler; Inhale 2 puffs into the lungs 2 (two) times daily.  Dispense: 1 each; Refill: 3 - predniSONE (DELTASONE) 20 MG tablet; Take 2 tablets (40 mg total) by mouth daily with breakfast for 5 days. 2 po daily for 5 days  Dispense: 10 tablet; Refill:  0   Follow Up Instructions: I discussed the assessment and treatment plan with the patient. The patient was provided an opportunity to ask questions and all were answered. The patient agreed with the plan and demonstrated an understanding of the instructions.  A copy of instructions were sent to the patient via MyChart.  The patient was advised to call back or seek an in-person evaluation if the symptoms worsen or if the condition fails to improve as anticipated.  Time:  I spent 8 minutes with the patient via telehealth technology discussing the above problems/concerns.    Gail Daphine Deutscher, FNP

## 2023-01-04 NOTE — Patient Instructions (Signed)
Gail Mathews, thank you for joining [name], FNP for today's virtual visit.  While this provider is not your primary care provider (PCP), if your PCP is located in our provider database this encounter information will be shared with them immediately following your visit.   A [facility] MyChart account gives you access to today's visit and all your visits, tests, and labs performed at [facility] " click here if you don't have a [facility] MyChart account or go to [email or web address]  Consent: (Patient) Gail Mathews provided verbal consent for this virtual visit at the beginning of the encounter.  Current Medications:  Current Outpatient Medications:    budesonide-formoterol (SYMBICORT) 80-4.5 MCG/ACT inhaler, Inhale 2 puffs into the lungs 2 (two) times daily., Disp: 1 each, Rfl: 3   predniSONE (DELTASONE) 20 MG tablet, Take 2 tablets (40 mg total) by mouth daily with breakfast for 5 days. 2 po daily for 5 days, Disp: 10 tablet, Rfl: 0   albuterol (PROVENTIL) (2.5 MG/3ML) 0.083% nebulizer solution, Take 3 mLs (2.5 mg total) by nebulization every 4 (four) hours as needed for wheezing or shortness of breath., Disp: 75 mL, Rfl: 0   albuterol (VENTOLIN HFA) 108 (90 Base) MCG/ACT inhaler, Inhale 2 puffs into the lungs every 4 (four) hours as needed for wheezing or shortness of breath., Disp: 18 g, Rfl: 0   cetirizine-pseudoephedrine (ZYRTEC-D) 5-120 MG tablet, Take 1 tablet by mouth 2 (two) times daily., Disp: 30 tablet, Rfl: 0   fluticasone (FLONASE) 50 MCG/ACT nasal spray, Place 2 sprays into both nostrils daily., Disp: 18.2 g, Rfl: 0   ibuprofen (ADVIL) 800 MG tablet, Take 1 tablet (800 mg total) by mouth 3 (three) times daily., Disp: 30 tablet, Rfl: 0   montelukast (SINGULAIR) 10 MG tablet, Take 1 tablet (10 mg total) by mouth daily., Disp: 30 tablet, Rfl: 0   Medications ordered in this encounter:  Meds ordered this encounter  Medications    budesonide-formoterol (SYMBICORT) 80-4.5 MCG/ACT inhaler    Sig: Inhale 2 puffs into the lungs 2 (two) times daily.    Dispense:  1 each    Refill:  3    Order Specific Question:   Supervising Provider    Answer:   Loreli Dollar   predniSONE (DELTASONE) 20 MG tablet    Sig: Take 2 tablets (40 mg total) by mouth daily with breakfast for 5 days. 2 po daily for 5 days    Dispense:  10 tablet    Refill:  0    Order Specific Question:   Supervising Provider    Answer:   Merrilee Jansky [id number]     *If you need refills on other medications prior to your next appointment, please contact your pharmacy*  Follow-Up: Call back or seek an in-person evaluation if the symptoms worsen or if the condition fails to improve as anticipated.  [facility] [phone]  Other Instructions 1. Take meds as prescribed 2. Use a cool mist humidifier especially during the winter months and when heat has been humid. 3. Use saline nose sprays frequently 4. Saline irrigations of the nose can be very helpful if done frequently.  * 4X daily for 1 week*  * Use of a nettie pot can be helpful with this. Follow directions with this* 5. Drink plenty of fluids 6. Keep thermostat turn down low 7.For any cough or congestion- mucinex or delsym 8. For fever or aces or pains- take tylenol or ibuprofen appropriate for age  and weight.  * for fevers greater than 101 orally you may alternate ibuprofen and tylenol every  3 hours.      If you have been instructed to have an in-person evaluation today at a local Urgent Care facility, please use the link below. It will take you to a list of all of our available [facility], including address, phone number and hours of operation. Please do not delay care.  [facility]  If you or a family member do not have a primary care provider, use the link below to schedule a visit and establish care. When you choose a [facility]  primary care physician or advanced practice provider, you gain a long-term partner in health. Find a Primary Care Provider  Learn more about [facility]'s in-office and virtual care options: [facility] - Get Care Now

## 2023-02-01 ENCOUNTER — Telehealth: Payer: Self-pay | Admitting: Physician Assistant

## 2023-02-01 DIAGNOSIS — R42 Dizziness and giddiness: Secondary | ICD-10-CM

## 2023-02-01 NOTE — Patient Instructions (Signed)
  Gail Mathews, thank you for joining Tylene Fantasia Ward, PA-C for today's virtual visit.  While this provider is not your primary care provider (PCP), if your PCP is located in our provider database this encounter information will be shared with them immediately following your visit.   A [facility] MyChart account gives you access to today's visit and all your visits, tests, and labs performed at [facility] " click here if you don't have a [facility] MyChart account or go to [email or web address]  Consent: (Patient) Gail Mathews provided verbal consent for this virtual visit at the beginning of the encounter.  Current Medications:  Current Outpatient Medications:    albuterol (PROVENTIL) (2.5 MG/3ML) 0.083% nebulizer solution, Take 3 mLs (2.5 mg total) by nebulization every 4 (four) hours as needed for wheezing or shortness of breath., Disp: 75 mL, Rfl: 0   albuterol (VENTOLIN HFA) 108 (90 Base) MCG/ACT inhaler, Inhale 2 puffs into the lungs every 4 (four) hours as needed for wheezing or shortness of breath., Disp: 18 g, Rfl: 0   budesonide-formoterol (SYMBICORT) 80-4.5 MCG/ACT inhaler, Inhale 2 puffs into the lungs 2 (two) times daily., Disp: 1 each, Rfl: 3   cetirizine-pseudoephedrine (ZYRTEC-D) 5-120 MG tablet, Take 1 tablet by mouth 2 (two) times daily., Disp: 30 tablet, Rfl: 0   fluticasone (FLONASE) 50 MCG/ACT nasal spray, Place 2 sprays into both nostrils daily., Disp: 18.2 g, Rfl: 0   ibuprofen (ADVIL) 800 MG tablet, Take 1 tablet (800 mg total) by mouth 3 (three) times daily., Disp: 30 tablet, Rfl: 0   montelukast (SINGULAIR) 10 MG tablet, Take 1 tablet (10 mg total) by mouth daily., Disp: 30 tablet, Rfl: 0   Medications ordered in this encounter:  No orders of the defined types were placed in this encounter.    *If you need refills on other medications prior to your next appointment, please contact your pharmacy*  Follow-Up: Call back or seek an in-person  evaluation if the symptoms worsen or if the condition fails to improve as anticipated.  [facility] [phone]  Other Instructions Drink plenty of fluids.  Follow up with your primary care physician as needed.  If you develop new or worsening symptoms go to the Emergency Department for evaluation.    If you have been instructed to have an in-person evaluation today at a local Urgent Care facility, please use the link below. It will take you to a list of all of our available [facility], including address, phone number and hours of operation. Please do not delay care.  [facility]  If you or a family member do not have a primary care provider, use the link below to schedule a visit and establish care. When you choose a [facility] primary care physician or advanced practice provider, you gain a long-term partner in health. Find a Primary Care Provider  Learn more about [facility]'s in-office and virtual care options:  - Get Care Now

## 2023-02-01 NOTE — Progress Notes (Signed)
Virtual Visit Consent   Sheryle Festa, you are scheduled for a virtual visit with a [facility] provider today. Just as with appointments in the office, your consent must be obtained to participate. Your consent will be active for this visit and any virtual visit you may have with one of our providers in the next 365 days. If you have a MyChart account, a copy of this consent can be sent to you electronically.  As this is a virtual visit, video technology does not allow for your provider to perform a traditional examination. This may limit your provider's ability to fully assess your condition. If your provider identifies any concerns that need to be evaluated in person or the need to arrange testing (such as labs, EKG, etc.), we will make arrangements to do so. Although advances in technology are sophisticated, we cannot ensure that it will always work on either your end or our end. If the connection with a video visit is poor, the visit may have to be switched to a telephone visit. With either a video or telephone visit, we are not always able to ensure that we have a secure connection.  By engaging in this virtual visit, you consent to the provision of healthcare and authorize for your insurance to be billed (if applicable) for the services provided during this visit. Depending on your insurance coverage, you may receive a charge related to this service.  I need to obtain your verbal consent now. Are you willing to proceed with your visit today? Lela Farran has provided verbal consent on 02/01/2023 for a virtual visit (video or telephone). Tylene Fantasia Ward, PA-C  Date: 02/01/2023 5:10 PM  Virtual Visit via Video Note   I, Tylene Fantasia Ward, connected with  [name]  ([id number], Dec 06, 1990) on 02/01/23 at  5:00 PM EDT by a video-enabled telemedicine application and verified that I am speaking with the correct person using two identifiers.  Location: Patient: Virtual Visit Location Patient:  Home Provider: Virtual Visit Location Provider: Home   I discussed the limitations of evaluation and management by telemedicine and the availability of in person appointments. The patient expressed understanding and agreed to proceed.    History of Present Illness: Gail Mathews is a 32 y.o. who identifies as a female who was assigned female at birth, and is being seen today for lightheadedness that occurred earlier today.  Pt reports she was outside for several hours and felt lightheaded.  EMS called, deemed pt stable and advised to drink more fluids.  She denies syncope, LOC.  She reports she has been feeling better once she started to drink more fluids, states she had had nothing to drink before going to the park.  She reports mild headache, no visual changes, n/v, weakness.  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   Asthma 06/08/2011    Allergies:  Allergies  Allergen Reactions   Other Swelling    Pecans/walnuts Pecans/walnuts   Amoxicillin Rash   Medications:  Current Outpatient Medications:    albuterol (PROVENTIL) (2.5 MG/3ML) 0.083% nebulizer solution, Take 3 mLs (2.5 mg total) by nebulization every 4 (four) hours as needed for wheezing or shortness of breath., Disp: 75 mL, Rfl: 0   albuterol (VENTOLIN HFA) 108 (90 Base) MCG/ACT inhaler, Inhale 2 puffs into the lungs every 4 (four) hours as needed for wheezing or shortness of breath., Disp: 18 g, Rfl: 0  budesonide-formoterol (SYMBICORT) 80-4.5 MCG/ACT inhaler, Inhale 2 puffs into the lungs 2 (two) times daily., Disp: 1 each, Rfl: 3   cetirizine-pseudoephedrine (ZYRTEC-D) 5-120 MG tablet, Take 1 tablet by mouth 2 (two) times daily., Disp: 30 tablet, Rfl: 0   fluticasone (FLONASE) 50 MCG/ACT nasal spray, Place 2 sprays into both nostrils daily., Disp: 18.2 g, Rfl: 0   ibuprofen (ADVIL) 800 MG tablet, Take 1 tablet (800 mg total) by mouth 3  (three) times daily., Disp: 30 tablet, Rfl: 0   montelukast (SINGULAIR) 10 MG tablet, Take 1 tablet (10 mg total) by mouth daily., Disp: 30 tablet, Rfl: 0  Observations/Objective: Patient is well-developed, well-nourished in no acute distress.  Resting comfortably at home.  Head is normocephalic, atraumatic.  No labored breathing.  Speech is clear and coherent with logical content.  Patient is alert and oriented at baseline.    Assessment and Plan: 1. Lightheaded  Pt feeling better at this time, advised to continue with fluids. Follow up with PCP.  ED precautions given.   Follow Up Instructions: I discussed the assessment and treatment plan with the patient. The patient was provided an opportunity to ask questions and all were answered. The patient agreed with the plan and demonstrated an understanding of the instructions.  A copy of instructions were sent to the patient via MyChart unless otherwise noted below.     The patient was advised to call back or seek an in-person evaluation if the symptoms worsen or if the condition fails to improve as anticipated.  Time:  I spent 9 minutes with the patient via telehealth technology discussing the above problems/concerns.    Tylene Fantasia Ward, PA-C

## 2023-02-15 ENCOUNTER — Telehealth: Payer: 59 | Admitting: Family Medicine

## 2023-02-15 DIAGNOSIS — R112 Nausea with vomiting, unspecified: Secondary | ICD-10-CM

## 2023-02-15 DIAGNOSIS — R197 Diarrhea, unspecified: Secondary | ICD-10-CM | POA: Diagnosis not present

## 2023-02-15 MED ORDER — ONDANSETRON HCL 4 MG PO TABS: 4.0000 mg | ORAL_TABLET | Freq: Three times a day (TID) | ORAL | 0 refills | Status: AC | PRN

## 2023-02-15 NOTE — Progress Notes (Signed)
Virtual Visit Consent   Gail Mathews, you are scheduled for a virtual visit with a [facility] provider today. Just as with appointments in the office, your consent must be obtained to participate. Your consent will be active for this visit and any virtual visit you may have with one of our providers in the next 365 days. If you have a MyChart account, a copy of this consent can be sent to you electronically.  As this is a virtual visit, video technology does not allow for your provider to perform a traditional examination. This may limit your provider's ability to fully assess your condition. If your provider identifies any concerns that need to be evaluated in person or the need to arrange testing (such as labs, EKG, etc.), we will make arrangements to do so. Although advances in technology are sophisticated, we cannot ensure that it will always work on either your end or our end. If the connection with a video visit is poor, the visit may have to be switched to a telephone visit. With either a video or telephone visit, we are not always able to ensure that we have a secure connection.  By engaging in this virtual visit, you consent to the provision of healthcare and authorize for your insurance to be billed (if applicable) for the services provided during this visit. Depending on your insurance coverage, you may receive a charge related to this service.  I need to obtain your verbal consent now. Are you willing to proceed with your visit today? Gail Mathews has provided verbal consent on 02/15/2023 for a virtual visit (video or telephone). Gail Curio, FNP  Date: 02/15/2023 10:53 AM  Virtual Visit via Video Note   I, Gail Mathews, connected with  Gail Mathews  ([id number], [birth year]) on 02/15/23 at 10:45 AM EDT by a video-enabled telemedicine application and verified that I am speaking with the correct person using two identifiers.  Location: Patient: Virtual Visit Location Patient:  Home Provider: Virtual Visit Location Provider: Home Office   I discussed the limitations of evaluation and management by telemedicine and the availability of in person appointments. The patient expressed understanding and agreed to proceed.    History of Present Illness: Gail Mathews is a 32 y.o. who identifies as a female who was assigned female at birth, and is being seen today for nausea vomiting and diarrhea since last night. Reports eating a jerk chicken at a new restaurant and is concerned she has food poisoning. She is able to keep down fluids with nausea. No fever. 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   Asthma 06/08/2011    Allergies:  Allergies  Allergen Reactions   Other Swelling    Pecans/walnuts Pecans/walnuts   Amoxicillin Rash   Medications:  Current Outpatient Medications:    ondansetron (ZOFRAN) 4 MG tablet, Take 1 tablet (4 mg total) by mouth every 8 (eight) hours as needed for up to 5 days for nausea or vomiting., Disp: 20 tablet, Rfl: 0   albuterol (PROVENTIL) (2.5 MG/3ML) 0.083% nebulizer solution, Take 3 mLs (2.5 mg total) by nebulization every 4 (four) hours as needed for wheezing or shortness of breath., Disp: 75 mL, Rfl: 0   albuterol (VENTOLIN HFA) 108 (90 Base) MCG/ACT inhaler, Inhale 2 puffs into the lungs every 4 (four) hours as needed for wheezing or shortness of breath., Disp: 18 g, Rfl: 0   budesonide-formoterol (SYMBICORT) 80-4.5  MCG/ACT inhaler, Inhale 2 puffs into the lungs 2 (two) times daily., Disp: 1 each, Rfl: 3   cetirizine-pseudoephedrine (ZYRTEC-D) 5-120 MG tablet, Take 1 tablet by mouth 2 (two) times daily., Disp: 30 tablet, Rfl: 0   fluticasone (FLONASE) 50 MCG/ACT nasal spray, Place 2 sprays into both nostrils daily., Disp: 18.2 g, Rfl: 0   ibuprofen (ADVIL) 800 MG tablet, Take 1 tablet (800 mg total) by mouth 3 (three) times daily., Disp: 30 tablet,  Rfl: 0   montelukast (SINGULAIR) 10 MG tablet, Take 1 tablet (10 mg total) by mouth daily., Disp: 30 tablet, Rfl: 0  Observations/Objective: Patient is well-developed, well-nourished in no acute distress.  Sitting in parked car in no distress.   Head is normocephalic, atraumatic.  No labored breathing.  Speech is clear and coherent with logical content.  Patient is alert and oriented at baseline.    Assessment and Plan: 1. Nausea and vomiting, unspecified vomiting type  2. Diarrhea, unspecified type  Increase fluids, no milk or dairy, UC if sx worsen.   Follow Up Instructions: I discussed the assessment and treatment plan with the patient. The patient was provided an opportunity to ask questions and all were answered. The patient agreed with the plan and demonstrated an understanding of the instructions.  A copy of instructions were sent to the patient via MyChart unless otherwise noted below.     The patient was advised to call back or seek an in-person evaluation if the symptoms worsen or if the condition fails to improve as anticipated.  Time:  I spent 10 minutes with the patient via telehealth technology discussing the above problems/concerns.    Gail Curio, FNP

## 2023-02-15 NOTE — Patient Instructions (Signed)
Nausea and Vomiting, Adult Nausea is the feeling that you have an upset stomach or that you are about to vomit. As nausea gets worse, it can lead to vomiting. Vomiting is when stomach contents forcefully come out of your mouth as a result of nausea. Vomiting can make you feel weak and cause you to become dehydrated. Dehydration can make you feel tired and thirsty, cause you to have a dry mouth, and decrease how often you urinate. Older adults and people with other diseases or a weak disease-fighting system (immune system) are at higher risk for dehydration. It is important to treat your nausea and vomiting as told by your health care provider. Follow these instructions at home: Watch your symptoms for any changes. Tell your health care provider about them. Eating and drinking     Take an oral rehydration solution (ORS). This is a drink that is sold at pharmacies and retail stores. Drink clear fluids slowly and in small amounts as you are able. Clear fluids include water, ice chips, low-calorie sports drinks, and fruit juice that has water added (diluted fruit juice). Eat bland, easy-to-digest foods in small amounts as you are able. These foods include bananas, applesauce, rice, lean meats, toast, and crackers. Avoid fluids that contain a lot of sugar or caffeine, such as energy drinks, sports drinks, and soda. Avoid alcohol. Avoid spicy or fatty foods. General instructions Take over-the-counter and prescription medicines only as told by your health care provider. Drink enough fluid to keep your urine pale yellow. Wash your hands often using soap and water for at least 20 seconds. If soap and water are not available, use hand sanitizer. Make sure that everyone in your household washes their hands well and often. Rest at home while you recover. Watch your condition for any changes. Take slow and deep breaths when you feel nauseous. Keep all follow-up visits. This is important. Contact a health  care provider if: Your symptoms get worse. You have new symptoms. You have a fever. You cannot drink fluids without vomiting. Your nausea does not go away after 2 days. You feel light-headed or dizzy. You have a headache. You have muscle cramps. You have a rash. You have pain while urinating. Get help right away if: You have pain in your chest, neck, arm, or jaw. You feel extremely weak or you faint. You have persistent vomiting. You have vomit that is bright red or looks like black coffee grounds. You have bloody or black stools (feces) or stools that look like tar. You have a severe headache, a stiff neck, or both. You have severe pain, cramping, or bloating in your abdomen. You have difficulty breathing, or you are breathing very quickly. Your heart is beating very quickly. Your skin feels cold and clammy. You feel confused. You have signs of dehydration, such as: Dark urine, very little urine, or no urine. Cracked lips. Dry mouth. Sunken eyes. Sleepiness. Weakness. These symptoms may be an emergency. Get help right away. Call 911. Do not wait to see if the symptoms will go away. Do not drive yourself to the hospital. Summary Nausea is the feeling that you have an upset stomach or that you are about to vomit. As nausea gets worse, it can lead to vomiting. Vomiting can make you feel weak and cause you to become dehydrated. Follow instructions from your health care provider about eating and drinking to prevent dehydration. Take over-the-counter and prescription medicines only as told by your health care provider. Contact your health care  provider if your symptoms get worse, or you have new symptoms. Keep all follow-up visits. This is important. This information is not intended to replace advice given to you by your health care provider. Make sure you discuss any questions you have with your health care provider. Document Revised: 02/23/2021 Document Reviewed:  02/23/2021 Elsevier Patient Education  2024 [employer].

## 2023-03-13 ENCOUNTER — Telehealth: Payer: 59 | Admitting: Nurse Practitioner

## 2023-03-13 DIAGNOSIS — H66003 Acute suppurative otitis media without spontaneous rupture of ear drum, bilateral: Secondary | ICD-10-CM | POA: Diagnosis not present

## 2023-03-13 MED ORDER — FLUTICASONE PROPIONATE 50 MCG/ACT NA SUSP: 2.0000 | Freq: Every day | NASAL | 6 refills | Status: DC

## 2023-03-13 MED ORDER — AZITHROMYCIN 250 MG PO TABS: ORAL_TABLET | ORAL | 0 refills | Status: AC

## 2023-03-13 NOTE — Progress Notes (Signed)
Virtual Visit Consent   Gail Mathews, you are scheduled for a virtual visit with a [facility] provider today. Just as with appointments in the office, your consent must be obtained to participate. Your consent will be active for this visit and any virtual visit you may have with one of our providers in the next 365 days. If you have a MyChart account, a copy of this consent can be sent to you electronically.  As this is a virtual visit, video technology does not allow for your provider to perform a traditional examination. This may limit your provider's ability to fully assess your condition. If your provider identifies any concerns that need to be evaluated in person or the need to arrange testing (such as labs, EKG, etc.), we will make arrangements to do so. Although advances in technology are sophisticated, we cannot ensure that it will always work on either your end or our end. If the connection with a video visit is poor, the visit may have to be switched to a telephone visit. With either a video or telephone visit, we are not always able to ensure that we have a secure connection.  By engaging in this virtual visit, you consent to the provision of healthcare and authorize for your insurance to be billed (if applicable) for the services provided during this visit. Depending on your insurance coverage, you may receive a charge related to this service.  I need to obtain your verbal consent now. Are you willing to proceed with your visit today? Gail Mathews has provided verbal consent on 03/13/2023 for a virtual visit (video or telephone). Viviano Simas, FNP  Date: 03/13/2023 11:39 AM  Virtual Visit via Video Note   I, Viviano Simas, connected with  Gail Mathews  ([id number], 26-Apr-1991) on 03/13/23 at 11:45 AM EDT by a video-enabled telemedicine application and verified that I am speaking with the correct person using two identifiers.  Location: Patient: Virtual Visit Location Patient:  Home Provider: Virtual Visit Location Provider: Home Office   I discussed the limitations of evaluation and management by telemedicine and the availability of in person appointments. The patient expressed understanding and agreed to proceed.    History of Present Illness: Gail Mathews is a 32 y.o. who identifies as a female who was assigned female at birth, and is being seen today for follow up on an ear infection.   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   Asthma 06/08/2011    Allergies:  Allergies  Allergen Reactions   Other Swelling    Pecans/walnuts Pecans/walnuts   Amoxicillin Rash   Medications:  Current Outpatient Medications:    albuterol (PROVENTIL) (2.5 MG/3ML) 0.083% nebulizer solution, Take 3 mLs (2.5 mg total) by nebulization every 4 (four) hours as needed for wheezing or shortness of breath., Disp: 75 mL, Rfl: 0   albuterol (VENTOLIN HFA) 108 (90 Base) MCG/ACT inhaler, Inhale 2 puffs into the lungs  every 4 (four) hours as needed for wheezing or shortness of breath., Disp: 18 g, Rfl: 0   budesonide-formoterol (SYMBICORT) 80-4.5 MCG/ACT inhaler, Inhale 2 puffs into the lungs 2 (two) times daily., Disp: 1 each, Rfl: 3   cetirizine-pseudoephedrine (ZYRTEC-D) 5-120 MG tablet, Take 1 tablet by mouth 2 (two) times daily., Disp: 30 tablet, Rfl: 0   fluticasone (FLONASE) 50 MCG/ACT nasal spray, Place 2 sprays into both nostrils daily., Disp: 18.2 g, Rfl: 0   ibuprofen (ADVIL) 800 MG tablet, Take 1 tablet (800  mg total) by mouth 3 (three) times daily., Disp: 30 tablet, Rfl: 0   montelukast (SINGULAIR) 10 MG tablet, Take 1 tablet (10 mg total) by mouth daily., Disp: 30 tablet, Rfl: 0  Observations/Objective: Patient is well-developed, well-nourished in no acute distress.  Resting comfortably  at home.  Head is normocephalic, atraumatic.  No labored breathing.  Speech is clear and coherent with logical content.  Patient is alert and oriented at baseline.    Assessment and Plan:  1. Non-recurrent acute suppurative otitis media of both ears without spontaneous rupture of tympanic membranes  - azithromycin (ZITHROMAX) 250 MG tablet; Take 2 tablets on day 1, then 1 tablet daily on days 2 through 5  Dispense: 6 tablet; Refill: 0 - fluticasone (FLONASE) 50 MCG/ACT nasal spray; Place 2 sprays into both nostrils daily.  Dispense: 16 g; Refill: 6     Follow Up Instructions: I discussed the assessment and treatment plan with the patient. The patient was provided an opportunity to ask questions and all were answered. The patient agreed with the plan and demonstrated an understanding of the instructions.  A copy of instructions were sent to the patient via MyChart unless otherwise noted below.    The patient was advised to call back or seek an in-person evaluation if the symptoms worsen or if the condition fails to improve as anticipated.  Time:  I spent 15 minutes with the patient via telehealth technology discussing the above problems/concerns.    Viviano Simas, FNP

## 2023-03-24 ENCOUNTER — Telehealth: Payer: 59 | Admitting: Nurse Practitioner

## 2023-03-24 DIAGNOSIS — U071 COVID-19: Secondary | ICD-10-CM | POA: Diagnosis not present

## 2023-03-24 DIAGNOSIS — R051 Acute cough: Secondary | ICD-10-CM

## 2023-03-24 DIAGNOSIS — J0110 Acute frontal sinusitis, unspecified: Secondary | ICD-10-CM

## 2023-03-24 MED ORDER — DOXYCYCLINE HYCLATE 100 MG PO TABS: 100.0000 mg | ORAL_TABLET | Freq: Two times a day (BID) | ORAL | 0 refills | Status: AC

## 2023-03-24 MED ORDER — PSEUDOEPH-BROMPHEN-DM 30-2-10 MG/5ML PO SYRP: 5.0000 mL | ORAL_SOLUTION | Freq: Four times a day (QID) | ORAL | 0 refills | Status: AC | PRN

## 2023-03-24 NOTE — Progress Notes (Signed)
Virtual Visit Consent   Gail Mathews, you are scheduled for a virtual visit with a [facility] provider today. Just as with appointments in the office, your consent must be obtained to participate. Your consent will be active for this visit and any virtual visit you may have with one of our providers in the next 365 days. If you have a MyChart account, a copy of this consent can be sent to you electronically.  As this is a virtual visit, video technology does not allow for your provider to perform a traditional examination. This may limit your provider's ability to fully assess your condition. If your provider identifies any concerns that need to be evaluated in person or the need to arrange testing (such as labs, EKG, etc.), we will make arrangements to do so. Although advances in technology are sophisticated, we cannot ensure that it will always work on either your end or our end. If the connection with a video visit is poor, the visit may have to be switched to a telephone visit. With either a video or telephone visit, we are not always able to ensure that we have a secure connection.  By engaging in this virtual visit, you consent to the provision of healthcare and authorize for your insurance to be billed (if applicable) for the services provided during this visit. Depending on your insurance coverage, you may receive a charge related to this service.  I need to obtain your verbal consent now. Are you willing to proceed with your visit today? Gail Mathews has provided verbal consent on 03/24/2023 for a virtual visit (video or telephone). Viviano Simas, FNP  Date: 03/24/2023 7:40 AM  Virtual Visit via Video Note   I, Viviano Simas, connected with  Gail Mathews  ([id number], 12-Jun-1991) on 03/24/23 at  7:45 AM EDT by a video-enabled telemedicine application and verified that I am speaking with the correct person using two identifiers.  Location: Patient: Virtual Visit Location Patient:  Home Provider: Virtual Visit Location Provider: Home Office   I discussed the limitations of evaluation and management by telemedicine and the availability of in person appointments. The patient expressed understanding and agreed to proceed.    History of Present Illness: Gail Mathews is a 32 y.o. who identifies as a female who was assigned female at birth, and is being seen today for COVID.   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   Asthma 06/08/2011    Allergies:  Allergies  Allergen Reactions   Other Swelling    Pecans/walnuts Pecans/walnuts   Amoxicillin Rash   Medications:  Current Outpatient Medications:    albuterol (PROVENTIL) (2.5 MG/3ML) 0.083% nebulizer solution, Take 3 mLs (2.5 mg total) by nebulization every 4 (four) hours as needed for wheezing or shortness of breath., Disp: 75 mL, Rfl: 0   albuterol (VENTOLIN HFA) 108 (90 Base) MCG/ACT inhaler, Inhale 2 puffs into the lungs every 4 (four) hours as needed for wheezing or shortness of breath., Disp: 18 g, Rfl: 0   budesonide-formoterol (SYMBICORT)  80-4.5 MCG/ACT inhaler, Inhale 2 puffs into the lungs 2 (two) times daily., Disp: 1 each, Rfl: 3   cetirizine-pseudoephedrine (ZYRTEC-D) 5-120 MG tablet, Take 1 tablet by mouth 2 (two) times daily., Disp: 30 tablet, Rfl: 0   fluticasone (FLONASE) 50 MCG/ACT nasal spray, Place 2 sprays into both nostrils daily., Disp: 16 g, Rfl: 6   ibuprofen (ADVIL) 800 MG tablet, Take 1 tablet (800 mg total) by mouth 3 (three) times daily., Disp: 30 tablet, Rfl: 0   montelukast  (SINGULAIR) 10 MG tablet, Take 1 tablet (10 mg total) by mouth daily., Disp: 30 tablet, Rfl: 0  Observations/Objective: Patient is well-developed, well-nourished in no acute distress.  Resting comfortably  at home.  Head is normocephalic, atraumatic.  No labored breathing.  Speech is clear and coherent with logical content.  Patient is alert and oriented at baseline.    Assessment and Plan: 1. Acute non-recurrent frontal sinusitis  - doxycycline (VIBRA-TABS) 100 MG tablet; Take 1 tablet (100 mg total) by mouth 2 (two) times daily for 10 days.  Dispense: 20 tablet; Refill: 0  2. Acute cough  - brompheniramine-pseudoephedrine-DM 30-2-10 MG/5ML syrup; Take 5 mLs by mouth 4 (four) times daily as needed for up to 7 days (cough and congesiton).  Dispense: 120 mL; Refill: 0  3. COVID-19 Continue to stay hydrated May continue Flonase  Continue to assure protein intake for recovery      Follow Up Instructions: I discussed the assessment and treatment plan with the patient. The patient was provided an opportunity to ask questions and all were answered. The patient agreed with the plan and demonstrated an understanding of the instructions.  A copy of instructions were sent to the patient via MyChart unless otherwise noted below.    The patient was advised to call back or seek an in-person evaluation if the symptoms worsen or if the condition fails to improve as anticipated.  Time:  I spent 16 minutes with the patient via telehealth technology discussing the above problems/concerns.    Viviano Simas, FNP

## 2023-04-18 ENCOUNTER — Telehealth: Payer: 59 | Admitting: Physician Assistant

## 2023-04-18 ENCOUNTER — Encounter (HOSPITAL_COMMUNITY): Payer: Self-pay

## 2023-04-18 ENCOUNTER — Telehealth: Payer: 59

## 2023-04-18 ENCOUNTER — Ambulatory Visit (HOSPITAL_COMMUNITY)
Admission: EM | Admit: 2023-04-18 | Discharge: 2023-04-18 | Disposition: A | Discharge status: Home / Self Care | Payer: 59 | Attending: Emergency Medicine | Admitting: Emergency Medicine

## 2023-04-18 DIAGNOSIS — A549 Gonococcal infection, unspecified: Secondary | ICD-10-CM

## 2023-04-18 DIAGNOSIS — B3731 Acute candidiasis of vulva and vagina: Secondary | ICD-10-CM | POA: Diagnosis not present

## 2023-04-18 MED ORDER — GENTAMICIN SULFATE 40 MG/ML IJ SOLN
240.0000 mg | Freq: Once | INTRAMUSCULAR | Status: AC
  Administered 2023-04-18: 240 mg via INTRAMUSCULAR

## 2023-04-18 MED ORDER — GENTAMICIN SULFATE 40 MG/ML IJ SOLN
INTRAMUSCULAR | Status: AC
  Filled 2023-04-18: qty 6

## 2023-04-18 MED ORDER — AZITHROMYCIN 250 MG PO TABS
ORAL_TABLET | ORAL | Status: AC
  Filled 2023-04-18: qty 4

## 2023-04-18 MED ORDER — AZITHROMYCIN 250 MG PO TABS
2000.0000 mg | ORAL_TABLET | Freq: Once | ORAL | Status: AC
  Administered 2023-04-18: 2000 mg via ORAL

## 2023-04-18 MED ORDER — FLUCONAZOLE 150 MG PO TABS: 150.0000 mg | ORAL_TABLET | Freq: Once | ORAL | 0 refills | Status: AC

## 2023-04-18 NOTE — Patient Instructions (Signed)
Gail Mathews, thank you for joining Piedad Climes, PA-C for today's virtual visit.  While this provider is not your primary care provider (PCP), if your PCP is located in our provider database this encounter information will be shared with them immediately following your visit.   A [facility] MyChart account gives you access to today's visit and all your visits, tests, and labs performed at [facility] " click here if you don't have a [facility] MyChart account or go to [email or web address]  Consent: (Patient) Gail Mathews provided verbal consent for this virtual visit at the beginning of the encounter.  Current Medications:  Current Outpatient Medications:    albuterol (PROVENTIL) (2.5 MG/3ML) 0.083% nebulizer solution, Take 3 mLs (2.5 mg total) by nebulization every 4 (four) hours as needed for wheezing or shortness of breath., Disp: 75 mL, Rfl: 0   albuterol (VENTOLIN HFA) 108 (90 Base) MCG/ACT inhaler, Inhale 2 puffs into the lungs every 4 (four) hours as needed for wheezing or shortness of breath., Disp: 18 g, Rfl: 0   budesonide-formoterol (SYMBICORT) 80-4.5 MCG/ACT inhaler, Inhale 2 puffs into the lungs 2 (two) times daily., Disp: 1 each, Rfl: 3   cetirizine-pseudoephedrine (ZYRTEC-D) 5-120 MG tablet, Take 1 tablet by mouth 2 (two) times daily., Disp: 30 tablet, Rfl: 0   fluticasone (FLONASE) 50 MCG/ACT nasal spray, Place 2 sprays into both nostrils daily., Disp: 16 g, Rfl: 6   ibuprofen (ADVIL) 800 MG tablet, Take 1 tablet (800 mg total) by mouth 3 (three) times daily., Disp: 30 tablet, Rfl: 0   montelukast (SINGULAIR) 10 MG tablet, Take 1 tablet (10 mg total) by mouth daily., Disp: 30 tablet, Rfl: 0   Medications ordered in this encounter:  No orders of the defined types were placed in this encounter.    *If you need refills on other medications prior to your next appointment, please contact your pharmacy*  Follow-Up: Call back or seek an in-person  evaluation if the symptoms worsen or if the condition fails to improve as anticipated.  [facility] [phone]  Other Instructions Use link below to help decide a time to go in for administration of antibiotics for gonorrhea.  Refrain from all sexual activity until treated and retested.  Make sure your partner is treated as well. Take the diflucan as directed for yeast. Make sure to follow-up with your PCP.  Gonorrhea Gonorrhea is a sexually transmitted infection (STI) that can infect any person. If left untreated, this infection can: Damage the reproductive organs. Spread to other parts of the body. Cause someone to be unable to have children (infertility). Harm an unborn baby if an infected person is pregnant. It is important to get treatment for gonorrhea as soon as possible. All of your sex partners may also need to be treated for the infection. What are the causes? This condition is caused by bacteria called Neisseria gonorrhoeae. The infection is spread from person to person through sexual contact, including oral, anal, and vaginal sex. The infection can also pass from a pregnant person to the baby during birth. What increases the risk? The following factors may make you more likely to develop this condition: Being a woman younger than 25 years and sexually active. Being a man who has sex with men. Having a new sex partner or having multiple partners. Having a sex partner who has an STI. Not using condoms correctly or not using condoms every time you have sex. Having a history of STIs. What are the  signs or symptoms? When symptoms occur, they may include: Abnormal discharge from the penis or vagina. The discharge may be cloudy, thick, or yellow-green in color. Pain or burning when you urinate. Itching, irritation, pain, bleeding, or discharge from the rectum. This may occur if the infection was spread by anal sex. Sore throat or swollen lymph nodes in the neck.  This may occur if the infection was spread by oral sex. Pain or swelling in the testicles. Bleeding between menstrual periods. If the infection has spread to other areas of the body, symptoms may include: Fever. Eye irritation. Swelling, redness, warmth, and pain in the joints. Rashes. In some cases, there are no symptoms. How is this diagnosed? This condition is diagnosed based on: A physical exam. A swab of fluid. The swab of fluid may be taken from the penis, vagina, throat, or rectum. Urine tests. Not all test results will be available during your visit. How is this treated? This condition is treated with antibiotic medicines. It is important to start treatment as soon as possible. Early treatment may prevent some problems from developing. You should not have sex during treatment. All types of sexual activity should be avoided for at least 7 days after treatment is complete and until your sex partner or partners have been treated. Follow these instructions at home: Take over-the-counter and prescription medicines as told by your health care provider. Finish all antibiotic medicine even when you start to feel better. Do not have sex during treatment. Do not have sex until at least 7 days after you and your partner or partners have finished treatment and your health care provider says it is okay. It is up to you to get your test results. Ask your health care provider, or the department that is doing the test, when your results will be ready. If you get a positive result on your gonorrhea test, tell your recent sex partners. These include any partners for oral, anal, or vaginal sex. They need to be checked for gonorrhea even if they do not have symptoms. They may need treatment, even if they get negative results on their gonorrhea tests. Keep all follow-up visits. This is important. How is this prevented?  Use latex or polyurethane condoms correctly every time you have sex. Ask if your  sex partner or partners have been tested for STIs and had negative results. Avoid having multiple sex partners. Get regular health screenings to check for STIs. Contact a health care provider if: Your symptoms do not get better after a few days of taking antibiotics. Your symptoms get worse. You cannot take your medicine as directed by your health care provider. You develop new symptoms, including: Eye irritation. Swelling, redness, warmth, and pain in the joints. Rashes. You have a fever. Summary Gonorrhea is a sexually transmitted infection (STI) that can infect any person. This infection is spread from person to person through sexual contact, including oral, anal, and vaginal sex. The infection can also pass from a pregnant person to the baby during birth. Symptoms include abnormal discharge, pain or burning while urinating, or pain in the rectum. This condition is treated with antibiotic medicines. Do not have sex until at least 7 days after both you and any sex partners have completed antibiotic treatment. Tell your health care provider if you have trouble taking your medicine, your symptoms get worse, or you have new symptoms. Keep all follow-up visits. This information is not intended to replace advice given to you by your health care provider.  Make sure you discuss any questions you have with your health care provider. Document Revised: 07/12/2021 Document Reviewed: 07/12/2021 Elsevier Patient Education  2024 [employer]    If you have been instructed to have an in-person evaluation today at a local Urgent Care facility, please use the link below. It will take you to a list of all of our available [facility], including address, phone number and hours of operation. Please do not delay care.  [facility]  If you or a family member do not have a primary care provider, use the link below to schedule a visit and establish care. When you choose a [facility]  primary care physician or advanced practice provider, you gain a long-term partner in health. Find a Primary Care Provider  Learn more about [facility] in-office and virtual care options: [facility] - Get Care Now

## 2023-04-18 NOTE — Discharge Instructions (Addendum)
We are treating you for gonorrhea today with azithromycin and gentamicin.  Refrain from intercourse for the the next 2-3 weeks.

## 2023-04-18 NOTE — Progress Notes (Signed)
Virtual Visit Consent   Gail Mathews, you are scheduled for a virtual visit with a [facility] provider today. Just as with appointments in the office, your consent must be obtained to participate. Your consent will be active for this visit and any virtual visit you may have with one of our providers in the next 365 days. If you have a MyChart account, a copy of this consent can be sent to you electronically.  As this is a virtual visit, video technology does not allow for your provider to perform a traditional examination. This may limit your provider's ability to fully assess your condition. If your provider identifies any concerns that need to be evaluated in person or the need to arrange testing (such as labs, EKG, etc.), we will make arrangements to do so. Although advances in technology are sophisticated, we cannot ensure that it will always work on either your end or our end. If the connection with a video visit is poor, the visit may have to be switched to a telephone visit. With either a video or telephone visit, we are not always able to ensure that we have a secure connection.  By engaging in this virtual visit, you consent to the provision of healthcare and authorize for your insurance to be billed (if applicable) for the services provided during this visit. Depending on your insurance coverage, you may receive a charge related to this service.  I need to obtain your verbal consent now. Are you willing to proceed with your visit today? Gail Mathews has provided verbal consent on 04/18/2023 for a virtual visit (video or telephone). Gail Mathews, [place]  Date: 04/18/2023 6:10 PM  Virtual Visit via Video Note   I, Gail Mathews, connected with  Gail Mathews  ([id number], 03-14-1991) on 04/18/23 at  6:00 PM EDT by a video-enabled telemedicine application and verified that I am speaking with the correct person using two identifiers.  Location: Patient: Virtual Visit Location  Patient: Home Provider: Virtual Visit Location Provider: Home Office   I discussed the limitations of evaluation and management by telemedicine and the availability of in person appointments. The patient expressed understanding and agreed to proceed.    History of Present Illness: Gail Mathews is a 32 y.o. who identifies as a female who was assigned female at birth, and is being seen today for assistance in what to do regarding some test results she saw on her Mychart ([facility]) this afternoon. Notes seen earlier in week by PCP with routine STI testing. Notes receiving notification of available results on her MyChart today, testing positive for candida and gonorrhea. Notes she called her PCP office and got an after-hours nurse who noted they could see the results as well but no provider had commented on them so were unable to treat until provider was back in office on Monday.   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   Asthma 06/08/2011    Allergies:  Allergies  Allergen Reactions   Other Swelling    Pecans/walnuts Pecans/walnuts   Amoxicillin Rash   Medications:  Current Outpatient Medications:    fluconazole (DIFLUCAN) 150 MG tablet, Take 1 tablet (150 mg total) by mouth once for 1 dose., Disp: 1 tablet, Rfl: 0   albuterol (PROVENTIL) (2.5 MG/3ML) 0.083% nebulizer solution, Take 3 mLs (2.5 mg total) by nebulization every 4 (four) hours as needed for wheezing or shortness of breath., Disp:  75 mL, Rfl: 0   albuterol (VENTOLIN HFA) 108 (90 Base) MCG/ACT inhaler, Inhale 2 puffs into the lungs every 4 (four) hours as needed for wheezing or shortness of breath., Disp: 18 g, Rfl: 0   budesonide-formoterol (SYMBICORT) 80-4.5 MCG/ACT inhaler, Inhale 2 puffs into the lungs 2 (two) times daily., Disp: 1 each, Rfl: 3   cetirizine-pseudoephedrine (ZYRTEC-D) 5-120 MG tablet, Take 1 tablet by mouth 2 (two) times  daily., Disp: 30 tablet, Rfl: 0   fluticasone (FLONASE) 50 MCG/ACT nasal spray, Place 2 sprays into both nostrils daily., Disp: 16 g, Rfl: 6   ibuprofen (ADVIL) 800 MG tablet, Take 1 tablet (800 mg total) by mouth 3 (three) times daily., Disp: 30 tablet, Rfl: 0   montelukast (SINGULAIR) 10 MG tablet, Take 1 tablet (10 mg total) by mouth daily., Disp: 30 tablet, Rfl: 0  Observations/Objective: Patient is well-developed, well-nourished in no acute distress.  Resting comfortably at home.  Head is normocephalic, atraumatic.  No labored breathing. Speech is clear and coherent with logical content.  Patient is alert and oriented at baseline.   Assessment and Plan: 1. Yeast vaginitis - fluconazole (DIFLUCAN) 150 MG tablet; Take 1 tablet (150 mg total) by mouth once for 1 dose.  Dispense: 1 tablet; Refill: 0  2. Neisseria gonorrheae  Reviewed Care Everywhere and confirmed positive neisseria gonorrhea and candida. Will start Diflucan for yeast. Discussed diagnosis of gonorrhea and associated risks of not properly treating. She is to notify sexual partners and to refrain from any activity until she is treated, partner is treated and they are retested to ensure resolution. Discussed standard of care is IM Rocephin which I cannot give via a virtual urgent care visit. Oral options available but not preferred agents. She was instructed to either be seen [facility] for administration of IM Rocephin or she is to await response from her PCP on next steps. Resources sent for [facility] locations/wait times.   Follow Up Instructions: I discussed the assessment and treatment plan with the patient. The patient was provided an opportunity to ask questions and all were answered. The patient agreed with the plan and demonstrated an understanding of the instructions.  A copy of instructions were sent to the patient via MyChart unless otherwise noted below.   The patient was advised to call back or seek an  in-person evaluation if the symptoms worsen or if the condition fails to improve as anticipated.  Time:  I spent 15 minutes with the patient via telehealth technology discussing the above problems/concerns.    Gail Climes, PA-C

## 2023-04-18 NOTE — ED Triage Notes (Signed)
Pt was informed of positive gonorrhea results. Pt would like to be treated today.

## 2023-04-18 NOTE — ED Provider Notes (Signed)
HPI  SUBJECTIVE:  Gail Mathews is a 32 y.o. female who presents with ***  Patient tested positive for gonorrhea and yeast on 8/13 at [facility]..  Syphilis, HIV, hepatitis negative.  Past Medical History:  Diagnosis Date   Allergic rhinitis    Asthma     History reviewed. No pertinent surgical history.  Family History  Problem Relation Age of Onset   Asthma Mother    Allergic rhinitis Mother    Diabetes Father     Social History   Tobacco Use   Smoking status: Never   Smokeless tobacco: Never  Substance Use Topics   Alcohol use: No   Drug use: No     Current Facility-Administered Medications:    azithromycin (ZITHROMAX) tablet 2,000 mg, 2,000 mg, Oral, Once, Domenick Gong, MD   gentamicin (GARAMYCIN) injection 240 mg, 240 mg, Intramuscular, Once, Domenick Gong, MD  Current Outpatient Medications:    albuterol (PROVENTIL) (2.5 MG/3ML) 0.083% nebulizer solution, Take 3 mLs (2.5 mg total) by nebulization every 4 (four) hours as needed for wheezing or shortness of breath., Disp: 75 mL, Rfl: 0   albuterol (VENTOLIN HFA) 108 (90 Base) MCG/ACT inhaler, Inhale 2 puffs into the lungs every 4 (four) hours as needed for wheezing or shortness of breath., Disp: 18 g, Rfl: 0   budesonide-formoterol (SYMBICORT) 80-4.5 MCG/ACT inhaler, Inhale 2 puffs into the lungs 2 (two) times daily., Disp: 1 each, Rfl: 3   cetirizine-pseudoephedrine (ZYRTEC-D) 5-120 MG tablet, Take 1 tablet by mouth 2 (two) times daily., Disp: 30 tablet, Rfl: 0   fluconazole (DIFLUCAN) 150 MG tablet, Take 1 tablet (150 mg total) by mouth once for 1 dose., Disp: 1 tablet, Rfl: 0   fluticasone (FLONASE) 50 MCG/ACT nasal spray, Place 2 sprays into both nostrils daily., Disp: 16 g, Rfl: 6   ibuprofen (ADVIL) 800 MG tablet, Take 1 tablet (800 mg total) by mouth 3 (three) times daily., Disp: 30 tablet, Rfl: 0   montelukast (SINGULAIR) 10 MG tablet, Take 1 tablet (10 mg total) by mouth daily., Disp: 30 tablet, Rfl:  0  Allergies  Allergen Reactions   Other Swelling    Pecans/walnuts Pecans/walnuts   Amoxicillin Rash     ROS  As noted in HPI.   Physical Exam  BP 112/71 (BP Location: Left Arm)   Pulse (!) 106   Temp 98 F (36.7 C) (Oral)   Resp 16   SpO2 98%   Constitutional: Well developed, well nourished, no acute distress Eyes:  EOMI, conjunctiva normal bilaterally HENT: Normocephalic, atraumatic,mucus membranes moist Respiratory: Normal inspiratory effort Cardiovascular: Normal rate GI: nondistended skin: No rash, skin intact Musculoskeletal: no deformities Neurologic: Alert & oriented x 3, no focal neuro deficits Psychiatric: Speech and behavior appropriate   ED Course   Medications  azithromycin (ZITHROMAX) tablet 2,000 mg (has no administration in time range)  gentamicin (GARAMYCIN) injection 240 mg (has no administration in time range)    No orders of the defined types were placed in this encounter.   No results found for this or any previous visit (from the past 24 hour(s)). No results found.  ED Clinical Impression  1. Gonorrhea      ED Assessment/Plan   {The patient has been seen in Urgent Care in the last 3 years. :1}  Outside records reviewed.  As noted in HPI.  Patient reports anaphylaxis to amoxicillin.  While this was over 10 years ago, I am concerned enough that I would prefer not to give a test dose  here.  Will give azithromycin 2 g orally p.o. plus gentamicin 240 mg IM x 1 per up-to-date recommendations.  Discussed labs, imaging, MDM, treatment plan, and plan for follow-up with {Blank single:19197::"family","parent","patient"}. Discussed sn/sx that should prompt return to the ED. {Blank single:19197::"family","parent","patient"} agrees with plan.   Meds ordered this encounter  Medications   azithromycin (ZITHROMAX) tablet 2,000 mg   gentamicin (GARAMYCIN) injection 240 mg      *This clinic note was created using [employer]. Therefore, there may be occasional mistakes despite careful proofreading.  ?

## 2023-05-01 ENCOUNTER — Telehealth: Payer: 59

## 2023-05-05 ENCOUNTER — Telehealth: Payer: 59 | Admitting: Physician Assistant

## 2023-05-05 DIAGNOSIS — J4541 Moderate persistent asthma with (acute) exacerbation: Secondary | ICD-10-CM

## 2023-05-05 MED ORDER — PREDNISONE 20 MG PO TABS: 40.0000 mg | ORAL_TABLET | Freq: Every day | ORAL | 0 refills | Status: DC

## 2023-05-05 MED ORDER — ALBUTEROL SULFATE HFA 108 (90 BASE) MCG/ACT IN AERS: 1.0000 | INHALATION_SPRAY | Freq: Four times a day (QID) | RESPIRATORY_TRACT | 0 refills | Status: DC | PRN

## 2023-05-05 MED ORDER — BENZONATATE 100 MG PO CAPS: 100.0000 mg | ORAL_CAPSULE | Freq: Three times a day (TID) | ORAL | 0 refills | Status: DC | PRN

## 2023-05-05 NOTE — Progress Notes (Signed)
Virtual Visit Consent   Gail Mathews, you are scheduled for a virtual visit with a [facility] provider today. Just as with appointments in the office, your consent must be obtained to participate. Your consent will be active for this visit and any virtual visit you may have with one of our providers in the next 365 days. If you have a MyChart account, a copy of this consent can be sent to you electronically.  As this is a virtual visit, video technology does not allow for your provider to perform a traditional examination. This may limit your provider's ability to fully assess your condition. If your provider identifies any concerns that need to be evaluated in person or the need to arrange testing (such as labs, EKG, etc.), we will make arrangements to do so. Although advances in technology are sophisticated, we cannot ensure that it will always work on either your end or our end. If the connection with a video visit is poor, the visit may have to be switched to a telephone visit. With either a video or telephone visit, we are not always able to ensure that we have a secure connection.  By engaging in this virtual visit, you consent to the provision of healthcare and authorize for your insurance to be billed (if applicable) for the services provided during this visit. Depending on your insurance coverage, you may receive a charge related to this service.  I need to obtain your verbal consent now. Are you willing to proceed with your visit today? Stephonie Mathews has provided verbal consent on 05/05/2023 for a virtual visit (video or telephone). Gail Loveless, PA-C  Date: 05/05/2023 11:59 AM  Virtual Visit via Video Note   I, Gail Mathews, connected with  Gail Mathews  ([id number], 1991/08/27) on 05/05/23 at 12:00 PM EDT by a video-enabled telemedicine application and verified that I am speaking with the correct person using two identifiers.  Location: Patient: Virtual Visit Location  Patient: Mobile Provider: Virtual Visit Location Provider: Home Office   I discussed the limitations of evaluation and management by telemedicine and the availability of in person appointments. The patient expressed understanding and agreed to proceed.    History of Present Illness: Gail Mathews is a 32 y.o. who identifies as a female who was assigned female at birth, and is being seen today for asthma.  HPI: Asthma She complains of chest tightness, cough (slight), frequent throat clearing, shortness of breath and wheezing (clears with coughing). There is no difficulty breathing, hoarse voice or sputum production. This is a new problem. The current episode started yesterday. The problem occurs constantly. The problem has been gradually worsening. The cough is non-productive, dry and barking. Pertinent negatives include no dyspnea on exertion, ear congestion, fever, headaches, nasal congestion, postnasal drip, rhinorrhea or sore throat. Her symptoms are aggravated by change in weather. Her symptoms are alleviated by beta-agonist and OTC cough suppressant. She reports no improvement on treatment. Her past medical history is significant for asthma.     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   Asthma 06/08/2011    Allergies:  Allergies  Allergen Reactions   Other Swelling    Pecans/walnuts Pecans/walnuts   Amoxicillin Rash   Medications:  Current Outpatient Medications:    albuterol (PROVENTIL) (2.5 MG/3ML) 0.083% nebulizer solution, Take 3 mLs (2.5 mg total) by nebulization every 4 (four) hours as needed for wheezing or shortness of breath., Disp:  75 mL, Rfl: 0   albuterol (VENTOLIN HFA) 108 (90 Base) MCG/ACT inhaler, Inhale 2 puffs into the lungs every 4 (four) hours as needed for wheezing or shortness of breath., Disp: 18 g, Rfl: 0   budesonide-formoterol (SYMBICORT) 80-4.5 MCG/ACT inhaler, Inhale 2 puffs into  the lungs 2 (two) times daily., Disp: 1 each, Rfl: 3   cetirizine-pseudoephedrine (ZYRTEC-D) 5-120 MG tablet, Take 1 tablet by mouth 2 (two) times daily., Disp: 30 tablet, Rfl: 0   fluticasone (FLONASE) 50 MCG/ACT nasal spray, Place 2 sprays into both nostrils daily., Disp: 16 g, Rfl: 6   ibuprofen (ADVIL) 800 MG tablet, Take 1 tablet (800 mg total) by mouth 3 (three) times daily., Disp: 30 tablet, Rfl: 0   montelukast (SINGULAIR) 10 MG tablet, Take 1 tablet (10 mg total) by mouth daily., Disp: 30 tablet, Rfl: 0  Observations/Objective: Patient is well-developed, well-nourished in no acute distress.  Resting comfortably  Head is normocephalic, atraumatic.  No labored breathing.  Speech is clear and coherent with logical content.  Patient is alert and oriented at baseline.    Assessment and Plan: There are no diagnoses linked to this encounter.  - Will treat with Prednisone and tessalon perles - Albuterol inhaler refilled - Can continue Mucinex  - Push fluids.  - Rest.  - Steam and humidifier can help - Seek in person evaluation if worsening or symptoms fail to improve    Follow Up Instructions: I discussed the assessment and treatment plan with the patient. The patient was provided an opportunity to ask questions and all were answered. The patient agreed with the plan and demonstrated an understanding of the instructions.  A copy of instructions were sent to the patient via MyChart unless otherwise noted below.    The patient was advised to call back or seek an in-person evaluation if the symptoms worsen or if the condition fails to improve as anticipated.  Time:  I spent 8 minutes with the patient via telehealth technology discussing the above problems/concerns.    Gail Loveless, PA-C

## 2023-05-05 NOTE — Patient Instructions (Signed)
Gail Mathews, thank you for joining [name], PA-C for today's virtual visit.  While this provider is not your primary care provider (PCP), if your PCP is located in our provider database this encounter information will be shared with them immediately following your visit.   A [place] MyChart account gives you access to today's visit and all your visits, tests, and labs performed at [facility] " click here if you don't have a [facility] MyChart account or go to [email or web address]  Consent: (Patient) Gail Mathews provided verbal consent for this virtual visit at the beginning of the encounter.  Current Medications:  Current Outpatient Medications:    albuterol (VENTOLIN HFA) 108 (90 Base) MCG/ACT inhaler, Inhale 1-2 puffs into the lungs every 6 (six) hours as needed., Disp: 18 g, Rfl: 0   benzonatate (TESSALON) 100 MG capsule, Take 1-2 capsules (100-200 mg total) by mouth 3 (three) times daily as needed., Disp: 30 capsule, Rfl: 0   predniSONE (DELTASONE) 20 MG tablet, Take 2 tablets (40 mg total) by mouth daily with breakfast., Disp: 10 tablet, Rfl: 0   albuterol (PROVENTIL) (2.5 MG/3ML) 0.083% nebulizer solution, Take 3 mLs (2.5 mg total) by nebulization every 4 (four) hours as needed for wheezing or shortness of breath., Disp: 75 mL, Rfl: 0   budesonide-formoterol (SYMBICORT) 80-4.5 MCG/ACT inhaler, Inhale 2 puffs into the lungs 2 (two) times daily., Disp: 1 each, Rfl: 3   cetirizine-pseudoephedrine (ZYRTEC-D) 5-120 MG tablet, Take 1 tablet by mouth 2 (two) times daily., Disp: 30 tablet, Rfl: 0   fluticasone (FLONASE) 50 MCG/ACT nasal spray, Place 2 sprays into both nostrils daily., Disp: 16 g, Rfl: 6   ibuprofen (ADVIL) 800 MG tablet, Take 1 tablet (800 mg total) by mouth 3 (three) times daily., Disp: 30 tablet, Rfl: 0   montelukast (SINGULAIR) 10 MG tablet, Take 1 tablet (10 mg total) by mouth daily., Disp: 30 tablet, Rfl: 0   Medications ordered in  this encounter:  Meds ordered this encounter  Medications   benzonatate (TESSALON) 100 MG capsule    Sig: Take 1-2 capsules (100-200 mg total) by mouth 3 (three) times daily as needed.    Dispense:  30 capsule    Refill:  0    Order Specific Question:   Supervising Provider    Answer:   Loreli Dollar   albuterol (VENTOLIN HFA) 108 (90 Base) MCG/ACT inhaler    Sig: Inhale 1-2 puffs into the lungs every 6 (six) hours as needed.    Dispense:  18 g    Refill:  0    Order Specific Question:   Supervising Provider    Answer:   Merrilee Jansky [[id number]]   predniSONE (DELTASONE) 20 MG tablet    Sig: Take 2 tablets (40 mg total) by mouth daily with breakfast.    Dispense:  10 tablet    Refill:  0    Order Specific Question:   Supervising Provider    Answer:   Merrilee Jansky [[id number]]     *If you need refills on other medications prior to your next appointment, please contact your pharmacy*  Follow-Up: Call back or seek an in-person evaluation if the symptoms worsen or if the condition fails to improve as anticipated.  [facility] [phone]  Other Instructions Asthma, Adult  Asthma is a long-term (chronic) condition that causes recurrent episodes in which the lower airways in the lungs become tight and narrow. The narrowing is caused by inflammation  and tightening of the smooth muscle around the lower airways. Asthma episodes, also called asthma attacks or asthma flares, may cause coughing, making high-pitched whistling sounds when you breathe, most often when you breathe out (wheezing), shortness of breath, and chest pain. The airways may produce extra mucus caused by the inflammation and irritation. During an attack, it can be difficult to breathe. Asthma attacks can range from minor to life-threatening. Asthma cannot be cured, but medicines and lifestyle changes can help control it and treat acute attacks. It is important to keep your asthma well  controlled so the condition does not interfere with your daily life. What are the causes? This condition is believed to be caused by inherited (genetic) and environmental factors, but its exact cause is not known. What can trigger an asthma attack? Many things can bring on an asthma attack or make symptoms worse. These triggers are different for every person. Common triggers include: Allergens and irritants like mold, dust, pet dander, cockroaches, pollen, air pollution, and chemical odors. Cigarette smoke. Weather changes and cold air. Stress and strong emotional responses such as crying or laughing hard. Certain medications such as aspirin or beta blockers. Infections and inflammatory conditions, such as the flu, a cold, pneumonia, or inflammation of the nasal membranes (rhinitis). Gastroesophageal reflux disease (GERD). What are the signs or symptoms? Symptoms may occur right after exposure to an asthma trigger or hours later and can vary by person. Common signs and symptoms include: Wheezing. Trouble breathing (shortness of breath). Excessive nighttime or early morning coughing. Chest tightness. Tiredness (fatigue) with minimal activity. Difficulty talking in complete sentences. Poor exercise tolerance. How is this diagnosed? This condition is diagnosed based on: A physical exam and your medical history. Tests, which may include: Lung function studies to evaluate the flow of air in your lungs. Allergy tests. Imaging tests, such as X-rays. How is this treated? There is no cure, but symptoms can be controlled with proper treatment. Treatment usually involves: Identifying and avoiding your asthma triggers. Inhaled medicines. Two types are commonly used to treat asthma, depending on severity: Controller medicines. These help prevent asthma symptoms from occurring. They are taken every day. Fast-acting reliever or rescue medicines. These quickly relieve asthma symptoms. They are used  as needed and provide short-term relief. Using other medicines, such as: Allergy medicines, such as antihistamines, if your asthma attacks are triggered by allergens. Immune medicines (immunomodulators). These are medicines that help control the immune system. Using supplemental oxygen. This is only needed during a severe episode. Creating an asthma action plan. An asthma action plan is a written plan for managing and treating your asthma attacks. This plan includes: A list of your asthma triggers and how to avoid them. Information about when medicines should be taken and when their dosage should be changed. Instructions about using a device called a peak flow meter. A peak flow meter measures how well the lungs are working and the severity of your asthma. It helps you monitor your condition. Follow these instructions at home: Take over-the-counter and prescription medicines only as told by your health care provider. Stay up to date on all vaccinations as recommended by your healthcare provider, including vaccines for the flu and pneumonia. Use a peak flow meter and keep track of your peak flow readings. Understand and use your asthma action plan to address any asthma flares. Do not smoke or allow anyone to smoke in your home. Contact a health care provider if: You have wheezing, shortness of breath, or  a cough that is not responding to medicines. Your medicines are causing side effects, such as a rash, itching, swelling, or trouble breathing. You need to use a reliever medicine more than 2-3 times a week. Your peak flow reading is still at 50-79% of your personal best after following your action plan for 1 hour. You have a fever and shortness of breath. Get help right away if: You are getting worse and do not respond to treatment during an asthma attack. You are short of breath when at rest or when doing very little physical activity. You have difficulty eating, drinking, or talking. You  have chest pain or tightness. You develop a fast heartbeat or palpitations. You have a bluish color to your lips or fingernails. You are light-headed or dizzy, or you faint. Your peak flow reading is less than 50% of your personal best. You feel too tired to breathe normally. These symptoms may be an emergency. Get help right away. Call 911. Do not wait to see if the symptoms will go away. Do not drive yourself to the hospital. Summary Asthma is a long-term (chronic) condition that causes recurrent episodes in which the airways become tight and narrow. Asthma episodes, also called asthma attacks or asthma flares, can cause coughing, wheezing, shortness of breath, and chest pain. Asthma cannot be cured, but medicines and lifestyle changes can help keep it well controlled and prevent asthma flares. Make sure you understand how to avoid triggers and how and when to use your medicines. Asthma attacks can range from minor to life-threatening. Get help right away if you have an asthma attack and do not respond to treatment with your usual rescue medicines. This information is not intended to replace advice given to you by your health care provider. Make sure you discuss any questions you have with your health care provider. Document Revised: 06/06/2021 Document Reviewed: 05/28/2021 Elsevier Patient Education  2024 Elsevier Inc.    If you have been instructed to have an in-person evaluation today at a local Urgent Care facility, please use the link below. It will take you to a list of all of our available [facility], including address, phone number and hours of operation. Please do not delay care.  [facility]  If you or a family member do not have a primary care provider, use the link below to schedule a visit and establish care. When you choose a [facility] primary care physician or advanced practice provider, you gain a long-term partner in health. Find a Primary Care  Provider  Learn more about [facility]'s in-office and virtual care options: [place] - Get Care Now

## 2023-06-03 ENCOUNTER — Telehealth: Payer: 59 | Admitting: Physician Assistant

## 2023-06-03 DIAGNOSIS — S76111A Strain of right quadriceps muscle, fascia and tendon, initial encounter: Secondary | ICD-10-CM

## 2023-06-03 DIAGNOSIS — M25561 Pain in right knee: Secondary | ICD-10-CM

## 2023-06-03 DIAGNOSIS — M25461 Effusion, right knee: Secondary | ICD-10-CM | POA: Diagnosis not present

## 2023-06-03 MED ORDER — IBUPROFEN 800 MG PO TABS: 800.0000 mg | ORAL_TABLET | Freq: Three times a day (TID) | ORAL | 0 refills | Status: DC | PRN

## 2023-06-03 NOTE — Patient Instructions (Signed)
Gail Mathews, thank you for joining [name], PA-C for today's virtual visit.  While this provider is not your primary care provider (PCP), if your PCP is located in our provider database this encounter information will be shared with them immediately following your visit.   A [facility] MyChart account gives you access to today's visit and all your visits, tests, and labs performed at [facility] " click here if you don't have a [place] MyChart account or go to [email or web address]  Consent: (Patient) Gail Mathews provided verbal consent for this virtual visit at the beginning of the encounter.  Current Medications:  Current Outpatient Medications:    ibuprofen (ADVIL) 800 MG tablet, Take 1 tablet (800 mg total) by mouth every 8 (eight) hours as needed., Disp: 30 tablet, Rfl: 0   albuterol (PROVENTIL) (2.5 MG/3ML) 0.083% nebulizer solution, Take 3 mLs (2.5 mg total) by nebulization every 4 (four) hours as needed for wheezing or shortness of breath., Disp: 75 mL, Rfl: 0   albuterol (VENTOLIN HFA) 108 (90 Base) MCG/ACT inhaler, Inhale 1-2 puffs into the lungs every 6 (six) hours as needed., Disp: 18 g, Rfl: 0   benzonatate (TESSALON) 100 MG capsule, Take 1-2 capsules (100-200 mg total) by mouth 3 (three) times daily as needed., Disp: 30 capsule, Rfl: 0   budesonide-formoterol (SYMBICORT) 80-4.5 MCG/ACT inhaler, Inhale 2 puffs into the lungs 2 (two) times daily., Disp: 1 each, Rfl: 3   cetirizine-pseudoephedrine (ZYRTEC-D) 5-120 MG tablet, Take 1 tablet by mouth 2 (two) times daily., Disp: 30 tablet, Rfl: 0   fluticasone (FLONASE) 50 MCG/ACT nasal spray, Place 2 sprays into both nostrils daily., Disp: 16 g, Rfl: 6   montelukast (SINGULAIR) 10 MG tablet, Take 1 tablet (10 mg total) by mouth daily., Disp: 30 tablet, Rfl: 0   predniSONE (DELTASONE) 20 MG tablet, Take 2 tablets (40 mg total) by mouth daily with breakfast., Disp: 10 tablet, Rfl: 0   Medications  ordered in this encounter:  Meds ordered this encounter  Medications   ibuprofen (ADVIL) 800 MG tablet    Sig: Take 1 tablet (800 mg total) by mouth every 8 (eight) hours as needed.    Dispense:  30 tablet    Refill:  0    Order Specific Question:   Supervising Provider    Answer:   Merrilee Jansky [id number]     *If you need refills on other medications prior to your next appointment, please contact your pharmacy*  Follow-Up: Call back or seek an in-person evaluation if the symptoms worsen or if the condition fails to improve as anticipated.  [facility] [phone]  Other Instructions Acute Knee Pain, Adult Many things can cause knee pain. Sometimes, knee pain is sudden (acute). It may be caused by damage, swelling, or irritation of the muscles and tissues that support your knee. Pain may come from: A fall. An injury to the knee from twisting motions. A hit to the knee. Infection. The pain often goes away on its own with time and rest. If the pain does not go away, tests may be done to find out what is causing the pain. These may include: Imaging tests, such as an X-ray, MRI, CT scan, or ultrasound. Joint aspiration. In this test, fluid is removed from the knee and checked. Arthroscopy. In this test, a lighted tube is put in the knee and an image is shown on a screen. A biopsy. In this test, a health care provider will remove  a small piece of tissue for testing. Follow these instructions at home: If you have a knee sleeve or brace that can be taken off:  Wear the knee sleeve or brace as told by your provider. Take it off only if your provider says that you can. Check the skin around it every day. Tell your provider if you see problems. Loosen the knee sleeve or brace if your toes tingle, are numb, or turn cold and blue. Keep the knee sleeve or brace clean and dry. Bathing If the knee sleeve or brace is not waterproof: Do not let it get wet. Cover it when  you take a bath or shower. Use a cover that does not let any water in. Managing pain, stiffness, and swelling  If told, put ice on the area. If you have a knee sleeve or brace that you can take off, remove it as told. Put ice in a plastic bag. Place a towel between your skin and the bag. Leave the ice on for 20 minutes, 2-3 times a day. If your skin turns bright red, take off the ice right away to prevent skin damage. The risk of damage is higher if you cannot feel pain, heat, or cold. Move your toes often to reduce stiffness and swelling. Raise the injured area above the level of your heart while you are sitting or lying down. Use a pillow to support your foot as needed. If told, use an elastic bandage to put pressure (compression) on your injured knee. This may control swelling, give support, and help with discomfort. Sleep with a pillow under your knee. Activity Rest your knee. Do not do things that cause pain or make pain worse. Do not stand or walk on your injured knee until you're told it's okay. Use crutches as told. Avoid activities where both feet leave the ground at the same time and put stress on the joints. Avoid running, jumping rope, and doing jumping jacks. Work with a physical therapist to make a safe exercise program if told. Physical therapy helps your knee move better and get stronger. Exercise as told. General instructions Take your medicines only as told by your provider. If you are overweight, work with your provider and an expert in healthy eating, called a dietician, to set goals to lose weight. Being overweight can make your knee hurt more. Do not smoke, vape, or use products with nicotine or tobacco in them. If you need help quitting, talk with your provider. Return to normal activities when you are told. Ask what things are safe for you to do. Watch for any changes in your symptoms. Keep all follow-up visits. Your provider will check your healing and adjust  treatments if needed. Contact a health care provider if: The knee pain does not stop. The knee pain changes or gets worse. You have a fever along with knee pain. Your knee is red or feels warm when you touch it. Your knee gives out or locks up. Get help right away if: Your knee swells and the swelling gets worse. You cannot move your knee. You have very bad knee pain that does not get better with medicine. This information is not intended to replace advice given to you by your health care provider. Make sure you discuss any questions you have with your health care provider. Document Revised: 10/14/2022 Document Reviewed: 10/14/2022 Elsevier Patient Education  2024 [employer].    If you have been instructed to have an in-person evaluation today at a local  Urgent Care facility, please use the link below. It will take you to a list of all of our available [facility], including address, phone number and hours of operation. Please do not delay care.  [facility]  If you or a family member do not have a primary care provider, use the link below to schedule a visit and establish care. When you choose a [facility] primary care physician or advanced practice provider, you gain a long-term partner in health. Find a Primary Care Provider  Learn more about [facility]'s in-office and virtual care options: [facility] - Get Care Now

## 2023-06-03 NOTE — Progress Notes (Signed)
Virtual Visit Consent   Gail Mathews, you are scheduled for a virtual visit with a [facility] provider today. Just as with appointments in the office, your consent must be obtained to participate. Your consent will be active for this visit and any virtual visit you may have with one of our providers in the next 365 days. If you have a MyChart account, a copy of this consent can be sent to you electronically.  As this is a virtual visit, video technology does not allow for your provider to perform a traditional examination. This may limit your provider's ability to fully assess your condition. If your provider identifies any concerns that need to be evaluated in person or the need to arrange testing (such as labs, EKG, etc.), we will make arrangements to do so. Although advances in technology are sophisticated, we cannot ensure that it will always work on either your end or our end. If the connection with a video visit is poor, the visit may have to be switched to a telephone visit. With either a video or telephone visit, we are not always able to ensure that we have a secure connection.  By engaging in this virtual visit, you consent to the provision of healthcare and authorize for your insurance to be billed (if applicable) for the services provided during this visit. Depending on your insurance coverage, you may receive a charge related to this service.  I need to obtain your verbal consent now. Are you willing to proceed with your visit today? Gail Mathews has provided verbal consent on 06/03/2023 for a virtual visit (video or telephone). Gail Loveless, PA-C  Date: 06/03/2023 1:42 PM  Virtual Visit via Video Note   I, Gail Mathews, connected with  Gail Mathews  ([id number], 01-21-1991) on 06/03/23 at  1:30 PM EDT by a video-enabled telemedicine application and verified that I am speaking with the correct person using two identifiers.  Location: Patient: Virtual Visit Location  Patient: Home Provider: Virtual Visit Location Provider: Home Office   I discussed the limitations of evaluation and management by telemedicine and the availability of in person appointments. The patient expressed understanding and agreed to proceed.    History of Present Illness: Gail Mathews is a 32 y.o. who identifies as a female who was assigned female at birth, and is being seen today for knee pain.  HPI: Knee Pain  The incident occurred 2 days ago (Sunday). The injury mechanism was a twisting injury (Was walking on wet surface during rain and slipped up stairs, was wearing crocs. Did not fall and felt she may have twisted her knee). The pain is present in the right knee. The quality of the pain is described as aching. The pain is moderate. The pain has been Fluctuating since onset. Associated symptoms include an inability to bear weight (walking aggravates it) and a loss of motion (flexion of knee is limited). Pertinent negatives include no loss of sensation, muscle weakness, numbness or tingling. She reports no foreign bodies present. The symptoms are aggravated by movement and weight bearing. She has tried NSAIDs, ice, rest and elevation for the symptoms. The treatment provided no relief.     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   Asthma 06/08/2011    Allergies:  Allergies  Allergen Reactions   Other Swelling    Pecans/walnuts Pecans/walnuts   Amoxicillin Rash   Medications:  Current Outpatient Medications:  ibuprofen (ADVIL) 800 MG tablet, Take 1 tablet (800 mg total) by mouth every 8 (eight) hours as needed., Disp: 30 tablet, Rfl: 0   albuterol (PROVENTIL) (2.5 MG/3ML) 0.083% nebulizer solution, Take 3 mLs (2.5 mg total) by nebulization every 4 (four) hours as needed for wheezing or shortness of breath., Disp: 75 mL, Rfl: 0   albuterol (VENTOLIN HFA) 108 (90 Base) MCG/ACT inhaler, Inhale 1-2 puffs  into the lungs every 6 (six) hours as needed., Disp: 18 g, Rfl: 0   benzonatate (TESSALON) 100 MG capsule, Take 1-2 capsules (100-200 mg total) by mouth 3 (three) times daily as needed., Disp: 30 capsule, Rfl: 0   budesonide-formoterol (SYMBICORT) 80-4.5 MCG/ACT inhaler, Inhale 2 puffs into the lungs 2 (two) times daily., Disp: 1 each, Rfl: 3   cetirizine-pseudoephedrine (ZYRTEC-D) 5-120 MG tablet, Take 1 tablet by mouth 2 (two) times daily., Disp: 30 tablet, Rfl: 0   fluticasone (FLONASE) 50 MCG/ACT nasal spray, Place 2 sprays into both nostrils daily., Disp: 16 g, Rfl: 6   montelukast (SINGULAIR) 10 MG tablet, Take 1 tablet (10 mg total) by mouth daily., Disp: 30 tablet, Rfl: 0   predniSONE (DELTASONE) 20 MG tablet, Take 2 tablets (40 mg total) by mouth daily with breakfast., Disp: 10 tablet, Rfl: 0  Observations/Objective: Patient is well-developed, well-nourished in no acute distress.  Resting comfortably at home.  Head is normocephalic, atraumatic.  No labored breathing.  Speech is clear and coherent with logical content.  Patient is alert and oriented at baseline.    Assessment and Plan: 1. Strain of quadriceps tendon, right, initial encounter  2. Pain and swelling of knee, right - ibuprofen (ADVIL) 800 MG tablet; Take 1 tablet (800 mg total) by mouth every 8 (eight) hours as needed.  Dispense: 30 tablet; Refill: 0  - Continue rest, ice, compression, elevation - Ibuprofen 800mg  added - Work note provided - Seek in person evaluation if worsening or symptoms fail to improve  Follow Up Instructions: I discussed the assessment and treatment plan with the patient. The patient was provided an opportunity to ask questions and all were answered. The patient agreed with the plan and demonstrated an understanding of the instructions.  A copy of instructions were sent to the patient via MyChart unless otherwise noted below.    The patient was advised to call back or seek an in-person  evaluation if the symptoms worsen or if the condition fails to improve as anticipated.   Gail Loveless, PA-C

## 2023-06-05 ENCOUNTER — Telehealth: Payer: 59

## 2023-06-05 ENCOUNTER — Telehealth: Payer: 59 | Admitting: Physician Assistant

## 2023-06-05 DIAGNOSIS — J4541 Moderate persistent asthma with (acute) exacerbation: Secondary | ICD-10-CM | POA: Diagnosis not present

## 2023-06-05 MED ORDER — ALBUTEROL SULFATE HFA 108 (90 BASE) MCG/ACT IN AERS: 1.0000 | INHALATION_SPRAY | Freq: Four times a day (QID) | RESPIRATORY_TRACT | 0 refills | Status: DC | PRN

## 2023-06-05 NOTE — Progress Notes (Signed)
Virtual Visit Consent   Gail Mathews, you are scheduled for a virtual visit with a [facility] provider today. Just as with appointments in the office, your consent must be obtained to participate. Your consent will be active for this visit and any virtual visit you may have with one of our providers in the next 365 days. If you have a MyChart account, a copy of this consent can be sent to you electronically.  As this is a virtual visit, video technology does not allow for your provider to perform a traditional examination. This may limit your provider's ability to fully assess your condition. If your provider identifies any concerns that need to be evaluated in person or the need to arrange testing (such as labs, EKG, etc.), we will make arrangements to do so. Although advances in technology are sophisticated, we cannot ensure that it will always work on either your end or our end. If the connection with a video visit is poor, the visit may have to be switched to a telephone visit. With either a video or telephone visit, we are not always able to ensure that we have a secure connection.  By engaging in this virtual visit, you consent to the provision of healthcare and authorize for your insurance to be billed (if applicable) for the services provided during this visit. Depending on your insurance coverage, you may receive a charge related to this service.  I need to obtain your verbal consent now. Are you willing to proceed with your visit today? Gail Mathews has provided verbal consent on 06/05/2023 for a virtual visit (video or telephone). Gail Loveless, PA-C  Date: 06/05/2023 9:28 AM  Virtual Visit via Video Note   I, Gail Mathews, connected with  Gail Mathews  ([id number], [birth year]) on 06/05/23 at  9:30 AM EDT by a video-enabled telemedicine application and verified that I am speaking with the correct person using two identifiers.  Location: Patient: Virtual Visit Location  Patient: Home Provider: Virtual Visit Location Provider: Home Office   I discussed the limitations of evaluation and management by telemedicine and the availability of in person appointments. The patient expressed understanding and agreed to proceed.    History of Present Illness: Gail Mathews is a 32 y.o. who identifies as a female who was assigned female at birth, and is being seen today for continued swelling and pain of the right knee. Seen virtually on 06/03/23 and advised to continued RICE with Ibuprofen and Tylenol. She reports improvements in swelling after ice treatment yesterday. She is requesting an extension of her work note for yesterday and today to continue to rest it.  She is alo requesting a refill of her Albuterol inhaler.   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   Asthma 06/08/2011    Allergies:  Allergies  Allergen Reactions   Other Swelling    Pecans/walnuts Pecans/walnuts   Amoxicillin Rash   Medications:  Current Outpatient Medications:    albuterol (PROVENTIL) (2.5 MG/3ML) 0.083% nebulizer solution, Take 3 mLs (2.5 mg total) by nebulization every 4 (four) hours as needed for wheezing or shortness of breath., Disp: 75 mL, Rfl: 0   albuterol (VENTOLIN HFA) 108 (90 Base) MCG/ACT inhaler, Inhale 1-2 puffs into the lungs every 6 (six) hours as needed., Disp: 18 g, Rfl: 0   benzonatate (TESSALON) 100 MG capsule, Take 1-2 capsules (100-200 mg total) by mouth 3 (three) times daily as needed.,  Disp: 30 capsule, Rfl: 0   budesonide-formoterol (SYMBICORT) 80-4.5 MCG/ACT inhaler, Inhale 2 puffs into the lungs 2 (two) times daily., Disp: 1 each, Rfl: 3   cetirizine-pseudoephedrine (ZYRTEC-D) 5-120 MG tablet, Take 1 tablet by mouth 2 (two) times daily., Disp: 30 tablet, Rfl: 0   fluticasone (FLONASE) 50 MCG/ACT nasal spray, Place 2 sprays into both nostrils daily., Disp: 16 g, Rfl: 6   ibuprofen  (ADVIL) 800 MG tablet, Take 1 tablet (800 mg total) by mouth every 8 (eight) hours as needed., Disp: 30 tablet, Rfl: 0   montelukast (SINGULAIR) 10 MG tablet, Take 1 tablet (10 mg total) by mouth daily., Disp: 30 tablet, Rfl: 0   predniSONE (DELTASONE) 20 MG tablet, Take 2 tablets (40 mg total) by mouth daily with breakfast., Disp: 10 tablet, Rfl: 0  Observations/Objective: Patient is well-developed, well-nourished in no acute distress.  Resting comfortably at home.  Head is normocephalic, atraumatic.  No labored breathing.  Speech is clear and coherent with logical content.  Patient is alert and oriented at baseline.    Assessment and Plan: There are no diagnoses linked to this encounter. - Work note extended - Continue rest, ice, compression, elevation - Continue Ibuprofen and tylenol - Albuterol refilled - Follow up in person if worsens or fails to improve  Follow Up Instructions: I discussed the assessment and treatment plan with the patient. The patient was provided an opportunity to ask questions and all were answered. The patient agreed with the plan and demonstrated an understanding of the instructions.  A copy of instructions were sent to the patient via MyChart unless otherwise noted below.    The patient was advised to call back or seek an in-person evaluation if the symptoms worsen or if the condition fails to improve as anticipated.   Gail Loveless, PA-C

## 2023-06-05 NOTE — Patient Instructions (Signed)
Gail Mathews, thank you for joining [name], PA-C for today's virtual visit.  While this provider is not your primary care provider (PCP), if your PCP is located in our provider database this encounter information will be shared with them immediately following your visit.   A [facility] MyChart account gives you access to today's visit and all your visits, tests, and labs performed at [facility] " click here if you don't have a [facility] MyChart account or go to [email or web address]  Consent: (Patient) Gail Mathews provided verbal consent for this virtual visit at the beginning of the encounter.  Current Medications:  Current Outpatient Medications:    albuterol (PROVENTIL) (2.5 MG/3ML) 0.083% nebulizer solution, Take 3 mLs (2.5 mg total) by nebulization every 4 (four) hours as needed for wheezing or shortness of breath., Disp: 75 mL, Rfl: 0   albuterol (VENTOLIN HFA) 108 (90 Base) MCG/ACT inhaler, Inhale 1-2 puffs into the lungs every 6 (six) hours as needed., Disp: 18 g, Rfl: 0   benzonatate (TESSALON) 100 MG capsule, Take 1-2 capsules (100-200 mg total) by mouth 3 (three) times daily as needed., Disp: 30 capsule, Rfl: 0   budesonide-formoterol (SYMBICORT) 80-4.5 MCG/ACT inhaler, Inhale 2 puffs into the lungs 2 (two) times daily., Disp: 1 each, Rfl: 3   cetirizine-pseudoephedrine (ZYRTEC-D) 5-120 MG tablet, Take 1 tablet by mouth 2 (two) times daily., Disp: 30 tablet, Rfl: 0   fluticasone (FLONASE) 50 MCG/ACT nasal spray, Place 2 sprays into both nostrils daily., Disp: 16 g, Rfl: 6   ibuprofen (ADVIL) 800 MG tablet, Take 1 tablet (800 mg total) by mouth every 8 (eight) hours as needed., Disp: 30 tablet, Rfl: 0   montelukast (SINGULAIR) 10 MG tablet, Take 1 tablet (10 mg total) by mouth daily., Disp: 30 tablet, Rfl: 0   predniSONE (DELTASONE) 20 MG tablet, Take 2 tablets (40 mg total) by mouth daily with breakfast., Disp: 10 tablet, Rfl: 0   Medications  ordered in this encounter:  Meds ordered this encounter  Medications   albuterol (VENTOLIN HFA) 108 (90 Base) MCG/ACT inhaler    Sig: Inhale 1-2 puffs into the lungs every 6 (six) hours as needed.    Dispense:  18 g    Refill:  0    Order Specific Question:   Supervising Provider    Answer:   Merrilee Jansky [id number]     *If you need refills on other medications prior to your next appointment, please contact your pharmacy*  Follow-Up: Call back or seek an in-person evaluation if the symptoms worsen or if the condition fails to improve as anticipated.  [facility] [phone]  Other Instructions Acute Knee Pain, Adult Many things can cause knee pain. Sometimes, knee pain is sudden (acute). It may be caused by damage, swelling, or irritation of the muscles and tissues that support your knee. Pain may come from: A fall. An injury to the knee from twisting motions. A hit to the knee. Infection. The pain often goes away on its own with time and rest. If the pain does not go away, tests may be done to find out what is causing the pain. These may include: Imaging tests, such as an X-ray, MRI, CT scan, or ultrasound. Joint aspiration. In this test, fluid is removed from the knee and checked. Arthroscopy. In this test, a lighted tube is put in the knee and an image is shown on a screen. A biopsy. In this test, a health care provider will  remove a small piece of tissue for testing. Follow these instructions at home: If you have a knee sleeve or brace that can be taken off:  Wear the knee sleeve or brace as told by your provider. Take it off only if your provider says that you can. Check the skin around it every day. Tell your provider if you see problems. Loosen the knee sleeve or brace if your toes tingle, are numb, or turn cold and blue. Keep the knee sleeve or brace clean and dry. Bathing If the knee sleeve or brace is not waterproof: Do not let it get wet. Cover  it when you take a bath or shower. Use a cover that does not let any water in. Managing pain, stiffness, and swelling  If told, put ice on the area. If you have a knee sleeve or brace that you can take off, remove it as told. Put ice in a plastic bag. Place a towel between your skin and the bag. Leave the ice on for 20 minutes, 2-3 times a day. If your skin turns bright red, take off the ice right away to prevent skin damage. The risk of damage is higher if you cannot feel pain, heat, or cold. Move your toes often to reduce stiffness and swelling. Raise the injured area above the level of your heart while you are sitting or lying down. Use a pillow to support your foot as needed. If told, use an elastic bandage to put pressure (compression) on your injured knee. This may control swelling, give support, and help with discomfort. Sleep with a pillow under your knee. Activity Rest your knee. Do not do things that cause pain or make pain worse. Do not stand or walk on your injured knee until you're told it's okay. Use crutches as told. Avoid activities where both feet leave the ground at the same time and put stress on the joints. Avoid running, jumping rope, and doing jumping jacks. Work with a physical therapist to make a safe exercise program if told. Physical therapy helps your knee move better and get stronger. Exercise as told. General instructions Take your medicines only as told by your provider. If you are overweight, work with your provider and an expert in healthy eating, called a dietician, to set goals to lose weight. Being overweight can make your knee hurt more. Do not smoke, vape, or use products with nicotine or tobacco in them. If you need help quitting, talk with your provider. Return to normal activities when you are told. Ask what things are safe for you to do. Watch for any changes in your symptoms. Keep all follow-up visits. Your provider will check your healing and adjust  treatments if needed. Contact a health care provider if: The knee pain does not stop. The knee pain changes or gets worse. You have a fever along with knee pain. Your knee is red or feels warm when you touch it. Your knee gives out or locks up. Get help right away if: Your knee swells and the swelling gets worse. You cannot move your knee. You have very bad knee pain that does not get better with medicine. This information is not intended to replace advice given to you by your health care provider. Make sure you discuss any questions you have with your health care provider. Document Revised: 10/14/2022 Document Reviewed: 10/14/2022 Elsevier Patient Education  2024 [employer].    If you have been instructed to have an in-person evaluation today at a  local Urgent Care facility, please use the link below. It will take you to a list of all of our available [facility], including address, phone number and hours of operation. Please do not delay care.  [facility]  If you or a family member do not have a primary care provider, use the link below to schedule a visit and establish care. When you choose a [facility] primary care physician or advanced practice provider, you gain a long-term partner in health. Find a Primary Care Provider  Learn more about [facility] in-office and virtual care options: [facility] - Get Care Now

## 2023-06-18 ENCOUNTER — Telehealth: Payer: 59 | Admitting: Nurse Practitioner

## 2023-06-18 ENCOUNTER — Encounter: Payer: Self-pay | Admitting: Family Medicine

## 2023-06-18 ENCOUNTER — Telehealth: Payer: Self-pay | Admitting: Family Medicine

## 2023-06-18 DIAGNOSIS — S76111A Strain of right quadriceps muscle, fascia and tendon, initial encounter: Secondary | ICD-10-CM

## 2023-06-18 DIAGNOSIS — M25562 Pain in left knee: Secondary | ICD-10-CM | POA: Diagnosis not present

## 2023-06-18 MED ORDER — NAPROXEN 500 MG PO TABS: 500.0000 mg | ORAL_TABLET | Freq: Two times a day (BID) | ORAL | 0 refills | Status: AC

## 2023-06-18 NOTE — Progress Notes (Signed)
Gail Mathews   Will be going to [facility] to be seen in person.

## 2023-06-18 NOTE — Progress Notes (Signed)
Virtual Visit Consent   Gail Mathews, you are scheduled for a virtual visit with a [facility] provider today. Just as with appointments in the office, your consent must be obtained to participate. Your consent will be active for this visit and any virtual visit you may have with one of our providers in the next 365 days. If you have a MyChart account, a copy of this consent can be sent to you electronically.  As this is a virtual visit, video technology does not allow for your provider to perform a traditional examination. This may limit your provider's ability to fully assess your condition. If your provider identifies any concerns that need to be evaluated in person or the need to arrange testing (such as labs, EKG, etc.), we will make arrangements to do so. Although advances in technology are sophisticated, we cannot ensure that it will always work on either your end or our end. If the connection with a video visit is poor, the visit may have to be switched to a telephone visit. With either a video or telephone visit, we are not always able to ensure that we have a secure connection.  By engaging in this virtual visit, you consent to the provision of healthcare and authorize for your insurance to be billed (if applicable) for the services provided during this visit. Depending on your insurance coverage, you may receive a charge related to this service.  I need to obtain your verbal consent now. Are you willing to proceed with your visit today? Gail Mathews has provided verbal consent on 06/18/2023 for a virtual visit (video or telephone). Viviano Simas, FNP  Date: 06/18/2023 3:32 PM  Virtual Visit via Video Note   I, Viviano Simas, connected with  Gail Mathews  ([id number], 112/23/1992) on 06/18/23 at  3:30 PM EDT by a video-enabled telemedicine application and verified that I am speaking with the correct person using two identifiers.  Location: Patient: Virtual Visit Location Patient:  Home Provider: Virtual Visit Location Provider: Home Office   I discussed the limitations of evaluation and management by telemedicine and the availability of in person appointments. The patient expressed understanding and agreed to proceed.    History of Present Illness: Gail Mathews is a 32 y.o. who identifies as a female who was assigned female at birth, and is being seen today for left knee swelling and pain  Symptom onset was 3 days ago She took ibuprofen initially and that did relieve the pain  Last night she started to have pain again while at work and took an ibuprofen and had relief  Today the pain is back, and she notes it mainly when going up and down the stairs in her home   She works on her feet and goes up and down the stairs frequently   When she feels the pain it seem to radiate from her foot up to her knee  She has swelling in the proximal component of her knee, and she has been putting ice on it throughout the day   Denies a history of surgery or injury to her left knee   Denies any swelling in her calves or tenderness in lower extremity    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 Just as with appointments in the office, your consent must be obtained to participate. Your consent will be active for this visit and any virtual visit you may have with one of our providers in the next 365 days. If you have a MyChart account, a copy of this consent can be sent to you electronically.  As this is a virtual visit, video technology does not allow for your provider to perform a traditional examination. This may limit your provider's ability to fully assess your condition. If your provider identifies any concerns that need to be evaluated in person or the need to arrange testing (such as labs, EKG, etc.), we will make arrangements to do so. Although advances in technology are sophisticated, we cannot ensure that it will always work on either your end or our end. If the connection with a video visit is poor, the visit may have to be switched to a telephone visit. With either a video or telephone visit, we are not always able to ensure that we have a secure connection.  By engaging in this virtual visit, you consent to the provision of healthcare and authorize for your insurance to be billed (if applicable) for the services provided during this visit. Depending on your insurance coverage, you may receive a charge related to this service.  I need to obtain your verbal consent now. Are you willing to proceed with your visit today? Gail Mathews has provided verbal consent on 06/18/2023 for a virtual visit (video or telephone). Viviano Simas, FNP  Date: 06/18/2023 3:32 PM  Virtual Visit via Video Note   I, Viviano Simas, connected with  Gail Mathews  ([id number], 106/10/1990) on 06/18/23 at  3:30 PM EDT by a video-enabled telemedicine application and verified that I am speaking with the correct person using two identifiers.  Location: Patient: Virtual Visit Location Patient:  Home Provider: Virtual Visit Location Provider: Home Office   I discussed the limitations of evaluation and management by telemedicine and the availability of in person appointments. The patient expressed understanding and agreed to proceed.    History of Present Illness: Gail Mathews is a 32 y.o. who identifies as a female who was assigned female at birth, and is being seen today for left knee swelling and pain  Symptom onset was 3 days ago She took ibuprofen initially and that did relieve the pain  Last night she started to have pain again while at work and took an ibuprofen and had relief  Today the pain is back, and she notes it mainly when going up and down the stairs in her home   She works on her feet and goes up and down the stairs frequently   When she feels the pain it seem to radiate from her foot up to her knee  She has swelling in the proximal component of her knee, and she has been putting ice on it throughout the day   Denies a history of surgery or injury to her left knee   Denies any swelling in her calves or tenderness in lower extremity    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   Asthma 06/08/2011    Allergies:  Allergies  Allergen Reactions   Other Swelling    Pecans/walnuts Pecans/walnuts   Amoxicillin Rash   Medications:  Current Outpatient Medications:

## 2023-06-18 NOTE — Progress Notes (Signed)
Duplicate. Error

## 2023-07-12 ENCOUNTER — Telehealth: Payer: Self-pay | Admitting: Physician Assistant

## 2023-07-12 DIAGNOSIS — M545 Low back pain, unspecified: Secondary | ICD-10-CM | POA: Diagnosis not present

## 2023-07-12 MED ORDER — LIDOCAINE 5 % EX PTCH: 1.0000 | MEDICATED_PATCH | CUTANEOUS | 0 refills | Status: DC

## 2023-07-12 MED ORDER — CYCLOBENZAPRINE HCL 10 MG PO TABS: 10.0000 mg | ORAL_TABLET | Freq: Three times a day (TID) | ORAL | 0 refills | Status: DC | PRN

## 2023-07-12 NOTE — Progress Notes (Signed)
Virtual Visit Consent   Gail Mathews, you are scheduled for a virtual visit with a [facility] provider today. Just as with appointments in the office, your consent must be obtained to participate. Your consent will be active for this visit and any virtual visit you may have with one of our providers in the next 365 days. If you have a MyChart account, a copy of this consent can be sent to you electronically.  As this is a virtual visit, video technology does not allow for your provider to perform a traditional examination. This may limit your provider's ability to fully assess your condition. If your provider identifies any concerns that need to be evaluated in person or the need to arrange testing (such as labs, EKG, etc.), we will make arrangements to do so. Although advances in technology are sophisticated, we cannot ensure that it will always work on either your end or our end. If the connection with a video visit is poor, the visit may have to be switched to a telephone visit. With either a video or telephone visit, we are not always able to ensure that we have a secure connection.  By engaging in this virtual visit, you consent to the provision of healthcare and authorize for your insurance to be billed (if applicable) for the services provided during this visit. Depending on your insurance coverage, you may receive a charge related to this service.  I need to obtain your verbal consent now. Are you willing to proceed with your visit today? Gail Mathews has provided verbal consent on 07/12/2023 for a virtual visit (video or telephone). Gail Mathews, [place]  Date: 07/12/2023 1:35 PM  Virtual Visit via Video Note   I, Gail Mathews, connected with  Gail Mathews  ([id number], July 23, 1991) on 07/12/23 at  1:15 PM EST by a video-enabled telemedicine application and verified that I am speaking with the correct person using two identifiers.  Location: Patient: Virtual Visit Location Patient:  Home Provider: Virtual Visit Location Provider: Home Office   I discussed the limitations of evaluation and management by telemedicine and the availability of in person appointments. The patient expressed understanding and agreed to proceed.    History of Present Illness: Gail Mathews is a 32 y.o. who identifies as a female who was assigned female at birth, and is being seen today for low back pain.  HPI: Back Pain This is a new problem. The current episode started in the past 7 days. The problem occurs 2 to 4 times per day. The problem has been gradually improving since onset. The pain is present in the lumbar spine. The quality of the pain is described as aching. The pain does not radiate. The symptoms are aggravated by bending. Pertinent negatives include no abdominal pain, bladder incontinence, bowel incontinence, chest pain, dysuria, fever, headaches, leg pain, numbness, paresis, paresthesias, pelvic pain, perianal numbness, tingling, weakness or weight loss. Risk factors include recent trauma. She has tried NSAIDs for the symptoms. The treatment provided moderate relief.    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   Asthma 06/08/2011    Allergies:  Allergies  Allergen Reactions   Other Swelling    Pecans/walnuts Pecans/walnuts   Amoxicillin Rash   Medications:  Current Outpatient Medications:    albuterol (PROVENTIL) (2.5 MG/3ML) 0.083% nebulizer solution, Take 3 mLs (2.5 mg total) by nebulization every 4 (four) hours as needed for wheezing or shortness of breath., Disp:  75 mL, Rfl: 0   albuterol (VENTOLIN HFA) 108 (90 Base) MCG/ACT inhaler, Inhale 1-2 puffs into the lungs every 6 (six) hours as needed., Disp: 18 g, Rfl: 0   benzonatate (TESSALON) 100 MG capsule, Take 1-2 capsules (100-200 mg total) by mouth 3 (three) times daily as needed., Disp: 30 capsule, Rfl: 0   budesonide-formoterol (SYMBICORT)  80-4.5 MCG/ACT inhaler, Inhale 2 puffs into the lungs 2 (two) times daily., Disp: 1 each, Rfl: 3   cetirizine-pseudoephedrine (ZYRTEC-D) 5-120 MG tablet, Take 1 tablet by mouth 2 (two) times daily., Disp: 30 tablet, Rfl: 0   fluticasone (FLONASE) 50 MCG/ACT nasal spray, Place 2 sprays into both nostrils daily., Disp: 16 g, Rfl: 6   ibuprofen (ADVIL) 800 MG tablet, Take 1 tablet (800 mg total) by mouth every 8 (eight) hours as needed., Disp: 30 tablet, Rfl: 0   montelukast (SINGULAIR) 10 MG tablet, Take 1 tablet (10 mg total) by mouth daily., Disp: 30 tablet, Rfl: 0   naproxen (NAPROSYN) 500 MG tablet, Take 1 tablet (500 mg total) by mouth 2 (two) times daily with a meal., Disp: 60 tablet, Rfl: 0   predniSONE (DELTASONE) 20 MG tablet, Take 2 tablets (40 mg total) by mouth daily with breakfast., Disp: 10 tablet, Rfl: 0  Observations/Objective: Patient is well-developed, well-nourished in no acute distress.  Resting comfortably  at home.  Head is normocephalic, atraumatic.  No labored breathing.  Speech is clear and coherent with logical content.  Patient is alert and oriented at baseline.    Assessment and Plan: 1. 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 No radiculopathy or red flag symptoms. Patient ambulating during call and  moving without issue. Will trial conservative treatment at this time with Naprosyn which she initiated on Thursday. Will also add muscle relaxer's, and lidocaine patches. Patient has been advised that this is a work related injury and that she will need to follow up with her jobs occupational medicine provider for ongoing care.   Follow Up Instructions: I discussed the assessment and treatment plan with the patient. The patient was provided an opportunity to ask questions and all were answered. The patient agreed with the plan and demonstrated  an understanding of the instructions.  A copy of instructions were sent to the patient via MyChart unless otherwise noted below. The patient was advised to call back or seek an in-person evaluation if the symptoms worsen or if the condition fails to improve as anticipated.    Gail Kidney, PA-C

## 2023-07-12 NOTE — Patient Instructions (Signed)
  Gail Mathews, thank you for joining [name], PA-C for today's virtual visit.  While this provider is not your primary care provider (PCP), if your PCP is located in our provider database this encounter information will be shared with them immediately following your visit.   A [facility] MyChart account gives you access to today's visit and all your visits, tests, and labs performed at [facility] " click here if you don't have a [facility] MyChart account or go to [email or web address]  Consent: (Patient) Gail Mathews provided verbal consent for this virtual visit at the beginning of the encounter.  Current Medications:  Current Outpatient Medications:    albuterol (PROVENTIL) (2.5 MG/3ML) 0.083% nebulizer solution, Take 3 mLs (2.5 mg total) by nebulization every 4 (four) hours as needed for wheezing or shortness of breath., Disp: 75 mL, Rfl: 0   albuterol (VENTOLIN HFA) 108 (90 Base) MCG/ACT inhaler, Inhale 1-2 puffs into the lungs every 6 (six) hours as needed., Disp: 18 g, Rfl: 0   benzonatate (TESSALON) 100 MG capsule, Take 1-2 capsules (100-200 mg total) by mouth 3 (three) times daily as needed., Disp: 30 capsule, Rfl: 0   budesonide-formoterol (SYMBICORT) 80-4.5 MCG/ACT inhaler, Inhale 2 puffs into the lungs 2 (two) times daily., Disp: 1 each, Rfl: 3   cetirizine-pseudoephedrine (ZYRTEC-D) 5-120 MG tablet, Take 1 tablet by mouth 2 (two) times daily., Disp: 30 tablet, Rfl: 0   fluticasone (FLONASE) 50 MCG/ACT nasal spray, Place 2 sprays into both nostrils daily., Disp: 16 g, Rfl: 6   ibuprofen (ADVIL) 800 MG tablet, Take 1 tablet (800 mg total) by mouth every 8 (eight) hours as needed., Disp: 30 tablet, Rfl: 0   montelukast (SINGULAIR) 10 MG tablet, Take 1 tablet (10 mg total) by mouth daily., Disp: 30 tablet, Rfl: 0   naproxen (NAPROSYN) 500 MG tablet, Take 1 tablet (500 mg total) by mouth 2 (two) times daily with a meal., Disp: 60 tablet, Rfl: 0   predniSONE  (DELTASONE) 20 MG tablet, Take 2 tablets (40 mg total) by mouth daily with breakfast., Disp: 10 tablet, Rfl: 0   Medications ordered in this encounter:  No orders of the defined types were placed in this encounter.    *If you need refills on other medications prior to your next appointment, please contact your pharmacy*  Follow-Up: Call back or seek an in-person evaluation if the symptoms worsen or if the condition fails to improve as anticipated.  [facility] [phone]  Other Instructions If your symptoms worsen or do not improve in the next 24-48 hours, you must report to an urgent care or ER for evaluation.    If you have been instructed to have an in-person evaluation today at a local Urgent Care facility, please use the link below. It will take you to a list of all of our available [facility], including address, phone number and hours of operation. Please do not delay care.  [facility]  If you or a family member do not have a primary care provider, use the link below to schedule a visit and establish care. When you choose a [facility] primary care physician or advanced practice provider, you gain a long-term partner in health. Find a Primary Care Provider  Learn more about [facility]'s in-office and virtual care options: [facility] - Get Care Now

## 2023-08-03 ENCOUNTER — Other Ambulatory Visit: Payer: Self-pay | Admitting: Nurse Practitioner

## 2023-08-03 DIAGNOSIS — M25562 Pain in left knee: Secondary | ICD-10-CM

## 2023-08-17 ENCOUNTER — Telehealth: Payer: Self-pay

## 2023-09-06 ENCOUNTER — Telehealth: Payer: Self-pay | Admitting: Nurse Practitioner

## 2023-09-06 DIAGNOSIS — M25552 Pain in left hip: Secondary | ICD-10-CM

## 2023-09-06 MED ORDER — CYCLOBENZAPRINE HCL 10 MG PO TABS: 10.0000 mg | ORAL_TABLET | Freq: Three times a day (TID) | ORAL | 0 refills | Status: DC | PRN

## 2023-09-06 MED ORDER — IBUPROFEN 800 MG PO TABS: 800.0000 mg | ORAL_TABLET | Freq: Three times a day (TID) | ORAL | 0 refills | Status: DC | PRN
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
## 2023-09-06 NOTE — Patient Instructions (Signed)
 Gail Mathews, thank you for joining Haze LELON Servant, NP for today's virtual visit.  While this provider is not your primary care provider (PCP), if your PCP is located in our provider database this encounter information will be shared with them immediately following your visit.   A [facility] MyChart account gives you access to today's visit and all your visits, tests, and labs performed at [facility]  click here if you don't have a [facility] MyChart account or go to [email or web address]  Consent: (Patient) Gail Mathews provided verbal consent for this virtual visit at the beginning of the encounter.  Current Medications:  Current Outpatient Medications:    albuterol  (PROVENTIL ) (2.5 MG/3ML) 0.083% nebulizer solution, Take 3 mLs (2.5 mg total) by nebulization every 4 (four) hours as needed for wheezing or shortness of breath., Disp: 75 mL, Rfl: 0   albuterol  (VENTOLIN  HFA) 108 (90 Base) MCG/ACT inhaler, Inhale 1-2 puffs into the lungs every 6 (six) hours as needed., Disp: 18 g, Rfl: 0   benzonatate  (TESSALON ) 100 MG capsule, Take 1-2 capsules (100-200 mg total) by mouth 3 (three) times daily as needed., Disp: 30 capsule, Rfl: 0   budesonide -formoterol  (SYMBICORT ) 80-4.5 MCG/ACT inhaler, Inhale 2 puffs into the lungs 2 (two) times daily., Disp: 1 each, Rfl: 3   cetirizine -pseudoephedrine  (ZYRTEC -D) 5-120 MG tablet, Take 1 tablet by mouth 2 (two) times daily., Disp: 30 tablet, Rfl: 0   cyclobenzaprine  (FLEXERIL ) 10 MG tablet, Take 1 tablet (10 mg total) by mouth 3 (three) times daily as needed for muscle spasms., Disp: 30 tablet, Rfl: 0   fluticasone  (FLONASE ) 50 MCG/ACT nasal spray, Place 2 sprays into both nostrils daily., Disp: 16 g, Rfl: 6   ibuprofen  (ADVIL ) 800 MG tablet, Take 1 tablet (800 mg total) by mouth every 8 (eight) hours as needed., Disp: 30 tablet, Rfl: 0   lidocaine  (LIDODERM ) 5 %, Place 1 patch onto the skin daily. Remove & Discard patch within 12  hours or as directed by MD, Disp: 30 patch, Rfl: 0   montelukast  (SINGULAIR ) 10 MG tablet, Take 1 tablet (10 mg total) by mouth daily., Disp: 30 tablet, Rfl: 0   predniSONE  (DELTASONE ) 20 MG tablet, Take 2 tablets (40 mg total) by mouth daily with breakfast., Disp: 10 tablet, Rfl: 0   Medications ordered in this encounter:  No orders of the defined types were placed in this encounter.    *If you need refills on other medications prior to your next appointment, please contact your pharmacy*  Follow-Up: Call back or seek an in-person evaluation if the symptoms worsen or if the condition fails to improve as anticipated.  [facility] [phone]  Other Instructions Work on losing weight to help reduce joint pain. May alternate with heat and ice application for pain relief. May also alternate with acetaminophen  and Ibuprofen  as prescribed pain relief. Other alternatives include massage, acupuncture and water aerobics.   If you have been instructed to have an in-person evaluation today at a local Urgent Care facility, please use the link below. It will take you to a list of all of our available [facility], including address, phone number and hours of operation. Please do not delay care.  [facility]  If you or a family member do not have a primary care provider, use the link below to schedule a visit and establish care. When you choose a [facility] primary care physician or advanced practice provider, you gain a long-term  partner in health. Find a Primary Care Provider  Learn more about [facility]'s in-office and virtual care options: [facility] - Get Care Now

## 2023-09-27 ENCOUNTER — Telehealth: Payer: Self-pay | Admitting: Nurse Practitioner

## 2023-09-27 DIAGNOSIS — R399 Unspecified symptoms and signs involving the genitourinary system: Secondary | ICD-10-CM | POA: Diagnosis not present

## 2023-09-27 MED ORDER — NITROFURANTOIN MONOHYD MACRO 100 MG PO CAPS: 100.0000 mg | ORAL_CAPSULE | Freq: Two times a day (BID) | ORAL | 0 refills | Status: AC

## 2023-09-27 NOTE — Progress Notes (Signed)
Virtual Visit Consent   [name], you are scheduled for a virtual visit with a [facility] provider today. Just as with appointments in the office, your consent must be obtained to participate. Your consent will be active for this visit and any virtual visit you may have with one of our providers in the next 365 days. If you have a MyChart account, a copy of this consent can be sent to you electronically.  As this is a virtual visit, video technology does not allow for your provider to perform a traditional examination. This may limit your provider's ability to fully assess your condition. If your provider identifies any concerns that need to be evaluated in person or the need to arrange testing (such as labs, EKG, etc.), we will make arrangements to do so. Although advances in technology are sophisticated, we cannot ensure that it will always work on either your end or our end. If the connection with a video visit is poor, the visit may have to be switched to a telephone visit. With either a video or telephone visit, we are not always able to ensure that we have a secure connection.  By engaging in this virtual visit, you consent to the provision of healthcare and authorize for your insurance to be billed (if applicable) for the services provided during this visit. Depending on your insurance coverage, you may receive a charge related to this service.  I need to obtain your verbal consent now. Are you willing to proceed with your visit today? Coila Wardell has provided verbal consent on 09/27/2023 for a virtual visit (video or telephone). Claiborne Rigg, NP  Date: 09/27/2023 4:59 PM  Virtual Visit via Video Note   I, Claiborne Rigg, connected with  Joseph Johns  ([id number], 1990/12/29) on 09/27/23 at  4:45 PM EST by a video-enabled telemedicine application and verified that I am speaking with the correct person using two identifiers.  Location: Patient: Virtual Visit Location Patient:  Home Provider: Virtual Visit Location Provider: Home Office   I discussed the limitations of evaluation and management by telemedicine and the availability of in person appointments. The patient expressed understanding and agreed to proceed.    History of Present Illness: Gail Mathews is a 33 y.o. who identifies as a female who was assigned female at birth, and is being seen today for positive UTI home test.  Ms Rommel has been experiencing lower pelvic pressure and Cloudy urine.She took a home UTI test and the results were positive. She denies back pain, hematuria, frequency or urgency or urination.   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   Asthma 06/08/2011    Allergies:  Allergies  Allergen Reactions   Other Swelling    Pecans/walnuts Pecans/walnuts   Amoxicillin Rash   Medications:  Current Outpatient Medications:    nitrofurantoin, macrocrystal-monohydrate, (MACROBID) 100 MG capsule, Take 1 capsule (100 mg total) by mouth 2 (two) times daily for 5 days., Disp: 10 capsule, Rfl: 0   albuterol (PROVENTIL) (2.5 MG/3ML) 0.083% nebulizer solution, Take 3 mLs (2.5 mg total) by nebulization every 4 (four) hours as needed for wheezing or shortness of breath., Disp: 75 mL, Rfl: 0   albuterol (VENTOLIN HFA) 108 (90 Base) MCG/ACT inhaler, Inhale 1-2 puffs into the lungs every 6 (six) hours as needed., Disp: 18 g, Rfl: 0   benzonatate (TESSALON) 100 MG capsule, Take 1-2 capsules (100-200 mg total) by mouth 3 (  three) times daily as needed., Disp: 30 capsule, Rfl: 0   budesonide-formoterol (SYMBICORT) 80-4.5 MCG/ACT inhaler, Inhale 2 puffs into the lungs 2 (two) times daily., Disp: 1 each, Rfl: 3   cetirizine-pseudoephedrine (ZYRTEC-D) 5-120 MG tablet, Take 1 tablet by mouth 2 (two) times daily., Disp: 30 tablet, Rfl: 0   cyclobenzaprine (FLEXERIL) 10 MG tablet, Take 1 tablet (10 mg total) by mouth 3 (three) times  daily as needed for muscle spasms., Disp: 60 tablet, Rfl: 0   fluticasone (FLONASE) 50 MCG/ACT nasal spray, Place 2 sprays into both nostrils daily., Disp: 16 g, Rfl: 6   ibuprofen (ADVIL) 800 MG tablet, Take 1 tablet (800 mg total) by mouth every 8 (eight) hours as needed., Disp: 60 tablet, Rfl: 0   lidocaine (LIDODERM) 5 %, Place 1 patch onto the skin daily. Remove & Discard patch within 12 hours or as directed by MD, Disp: 30 patch, Rfl: 0   montelukast (SINGULAIR) 10 MG tablet, Take 1 tablet (10 mg total) by mouth daily., Disp: 30 tablet, Rfl: 0   predniSONE (DELTASONE) 20 MG tablet, Take 2 tablets (40 mg total) by mouth daily with breakfast., Disp: 10 tablet, Rfl: 0  Observations/Objective: Patient is well-developed, well-nourished in no acute distress.  Resting comfortably at home.  Head is normocephalic, atraumatic.  No labored breathing.  Speech is clear and coherent with logical content.  Patient is alert and oriented at baseline.    Assessment and Plan: 1. UTI symptoms (Primary) - nitrofurantoin, macrocrystal-monohydrate, (MACROBID) 100 MG capsule; Take 1 capsule (100 mg total) by mouth 2 (two) times daily for 5 days.  Dispense: 10 capsule; Refill: 0   Follow Up Instructions: I discussed the assessment and treatment plan with the patient. The patient was provided an opportunity to ask questions and all were answered. The patient agreed with the plan and demonstrated an understanding of the instructions.  A copy of instructions were sent to the patient via MyChart unless otherwise noted below.    The patient was advised to call back or seek an in-person evaluation if the symptoms worsen or if the condition fails to improve as anticipated.    Claiborne Rigg, NP

## 2023-09-27 NOTE — Patient Instructions (Signed)
Gail Mathews, thank you for joining Claiborne Rigg, NP for today's virtual visit.  While this provider is not your primary care provider (PCP), if your PCP is located in our provider database this encounter information will be shared with them immediately following your visit.   A [facility] MyChart account gives you access to today's visit and all your visits, tests, and labs performed at [facility] " click here if you don't have a [name] account or go to [email or web address]  Consent: (Patient) Gail Mathews provided verbal consent for this virtual visit at the beginning of the encounter.  Current Medications:  Current Outpatient Medications:    nitrofurantoin, macrocrystal-monohydrate, (MACROBID) 100 MG capsule, Take 1 capsule (100 mg total) by mouth 2 (two) times daily for 5 days., Disp: 10 capsule, Rfl: 0   albuterol (PROVENTIL) (2.5 MG/3ML) 0.083% nebulizer solution, Take 3 mLs (2.5 mg total) by nebulization every 4 (four) hours as needed for wheezing or shortness of breath., Disp: 75 mL, Rfl: 0   albuterol (VENTOLIN HFA) 108 (90 Base) MCG/ACT inhaler, Inhale 1-2 puffs into the lungs every 6 (six) hours as needed., Disp: 18 g, Rfl: 0   benzonatate (TESSALON) 100 MG capsule, Take 1-2 capsules (100-200 mg total) by mouth 3 (three) times daily as needed., Disp: 30 capsule, Rfl: 0   budesonide-formoterol (SYMBICORT) 80-4.5 MCG/ACT inhaler, Inhale 2 puffs into the lungs 2 (two) times daily., Disp: 1 each, Rfl: 3   cetirizine-pseudoephedrine (ZYRTEC-D) 5-120 MG tablet, Take 1 tablet by mouth 2 (two) times daily., Disp: 30 tablet, Rfl: 0   cyclobenzaprine (FLEXERIL) 10 MG tablet, Take 1 tablet (10 mg total) by mouth 3 (three) times daily as needed for muscle spasms., Disp: 60 tablet, Rfl: 0   fluticasone (FLONASE) 50 MCG/ACT nasal spray, Place 2 sprays into both nostrils daily., Disp: 16 g, Rfl: 6   ibuprofen (ADVIL) 800 MG tablet, Take 1 tablet (800 mg total)  by mouth every 8 (eight) hours as needed., Disp: 60 tablet, Rfl: 0   lidocaine (LIDODERM) 5 %, Place 1 patch onto the skin daily. Remove & Discard patch within 12 hours or as directed by MD, Disp: 30 patch, Rfl: 0   montelukast (SINGULAIR) 10 MG tablet, Take 1 tablet (10 mg total) by mouth daily., Disp: 30 tablet, Rfl: 0   predniSONE (DELTASONE) 20 MG tablet, Take 2 tablets (40 mg total) by mouth daily with breakfast., Disp: 10 tablet, Rfl: 0   Medications ordered in this encounter:  Meds ordered this encounter  Medications   nitrofurantoin, macrocrystal-monohydrate, (MACROBID) 100 MG capsule    Sig: Take 1 capsule (100 mg total) by mouth 2 (two) times daily for 5 days.    Dispense:  10 capsule    Refill:  0    Supervising Provider:   Merrilee Jansky [[id number]]     *If you need refills on other medications prior to your next appointment, please contact your pharmacy*  Follow-Up: Call back or seek an in-person evaluation if the symptoms worsen or if the condition fails to improve as anticipated.  [facility] [phone]    If you have been instructed to have an in-person evaluation today at a local Urgent Care facility, please use the link below. It will take you to a list of all of our available [facility], including address, phone number and hours of operation. Please do not delay care.  [facility]  If you or a family member  do not have a primary care provider, use the link below to schedule a visit and establish care. When you choose a [facility] primary care physician or advanced practice provider, you gain a long-term partner in health. Find a Primary Care Provider  Learn more about [facility] in-office and virtual care options: [facility] - Get Care Now

## 2023-10-24 ENCOUNTER — Encounter (HOSPITAL_BASED_OUTPATIENT_CLINIC_OR_DEPARTMENT_OTHER): Payer: Self-pay | Admitting: Emergency Medicine

## 2023-10-24 ENCOUNTER — Emergency Department (HOSPITAL_BASED_OUTPATIENT_CLINIC_OR_DEPARTMENT_OTHER)
Admission: EM | Admit: 2023-10-24 | Discharge: 2023-10-24 | Disposition: A | Discharge status: Home / Self Care | Payer: 59 | Attending: Emergency Medicine | Admitting: Emergency Medicine

## 2023-10-24 ENCOUNTER — Other Ambulatory Visit: Payer: Self-pay

## 2023-10-24 ENCOUNTER — Telehealth (HOSPITAL_BASED_OUTPATIENT_CLINIC_OR_DEPARTMENT_OTHER): Payer: Self-pay | Admitting: Emergency Medicine

## 2023-10-24 DIAGNOSIS — J069 Acute upper respiratory infection, unspecified: Secondary | ICD-10-CM | POA: Insufficient documentation

## 2023-10-24 DIAGNOSIS — R000 Tachycardia, unspecified: Secondary | ICD-10-CM | POA: Insufficient documentation

## 2023-10-24 DIAGNOSIS — Z7951 Long term (current) use of inhaled steroids: Secondary | ICD-10-CM | POA: Insufficient documentation

## 2023-10-24 DIAGNOSIS — R059 Cough, unspecified: Secondary | ICD-10-CM | POA: Diagnosis present

## 2023-10-24 DIAGNOSIS — Z20822 Contact with and (suspected) exposure to covid-19: Secondary | ICD-10-CM | POA: Insufficient documentation

## 2023-10-24 DIAGNOSIS — J45909 Unspecified asthma, uncomplicated: Secondary | ICD-10-CM | POA: Diagnosis not present

## 2023-10-24 LAB — RESP PANEL BY RT-PCR (RSV, FLU A&B, COVID)  RVPGX2
Influenza A by PCR: NEGATIVE
Influenza B by PCR: NEGATIVE
Resp Syncytial Virus by PCR: NEGATIVE
SARS Coronavirus 2 by RT PCR: NEGATIVE

## 2023-10-24 MED ORDER — BENZONATATE 100 MG PO CAPS: 100.0000 mg | ORAL_CAPSULE | Freq: Three times a day (TID) | ORAL | 0 refills | Status: DC | PRN

## 2023-10-24 MED ORDER — ONDANSETRON 4 MG PO TBDP: 8.0000 mg | ORAL_TABLET | Freq: Once | ORAL | Status: DC

## 2023-10-24 MED ORDER — ONDANSETRON 4 MG PO TBDP: 4.0000 mg | ORAL_TABLET | Freq: Three times a day (TID) | ORAL | 0 refills | Status: DC | PRN

## 2023-10-24 NOTE — ED Notes (Signed)
Patient discharged to home alert, oriented and self-ambulatory.  Denied pain on discharge, vitals WNL   All discharge instructions reviewed and all questions answered.

## 2023-10-24 NOTE — Discharge Instructions (Signed)
As discussed, you tested negative for COVID, flu, RSV.  Suspect your symptoms are likely secondary to upper respiratory viral illness.  Will send home with cough medicine as well as nausea medicine to use as needed.  Recommend follow-up with your primary care for reassessment.  Please do not hesitate to return if the worrisome signs and symptoms we discussed become apparent.

## 2023-10-24 NOTE — ED Triage Notes (Signed)
Pt arrived POV from home, caox4, ambulatory, NAD c/o N/V/D, fever of 101 at home, chills, bodyaches since yesterday. Pt also states she may need a pregnancy test.

## 2023-10-24 NOTE — ED Provider Notes (Signed)
 [place] EMERGENCY DEPARTMENT AT [facility] Provider Note   CSN: [id number] Arrival date & time: 10/24/23  1037     History  Chief Complaint  Patient presents with   Flu-like Symptoms     Gail Mathews is a 33 y.o. female.  HPI   33 year old female presents emergency department with complaints of cough, congestion, body aches, 1 episode of emesis.  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  States that she [facility] pelvic on yesterday prior to episode of emesis.  Reports no nausea otherwise.  Patient took Tylenol due to fever at home yesterday.  Denies any chest pain, shortness of breath, abdominal pain, urinary symptoms, change in bowel habits.  Past medical history significant for asthma, allergic rhinitis  Home Medications Prior to Admission medications   Medication Sig Start Date End Date Taking? Authorizing Provider  benzonatate (TESSALON) 100 MG capsule Take 1 capsule (100 mg total) by mouth 3 (three) times daily as needed. 10/24/23  Yes Sherian Maroon A, PA  ondansetron (ZOFRAN-ODT) 4 MG disintegrating tablet Take 1 tablet (4 mg total) by mouth every 8 (eight) hours as needed. 10/24/23  Yes Sherian Maroon A, PA  albuterol (PROVENTIL) (2.5 MG/3ML) 0.083% nebulizer solution Take 3 mLs (2.5 mg total) by nebulization every 4 (four) hours as needed for wheezing or shortness of breath. 10/31/22   Rodriguez-Southworth, Nettie Elm, PA-C  albuterol (VENTOLIN HFA) 108 (90 Base) MCG/ACT inhaler Inhale 1-2 puffs into the lungs every 6 (six) hours as needed. 06/05/23   [name], PA-C  budesonide-formoterol (SYMBICORT) 80-4.5 MCG/ACT inhaler Inhale 2 puffs into the lungs 2 (two) times daily. 01/04/23   Daphine Deutscher, Mary-Margaret, FNP  cetirizine-pseudoephedrine (ZYRTEC-D) 5-120 MG tablet Take 1 tablet by mouth 2 (two) times daily. 06/12/20   Wieters, Hallie C, PA-C  cyclobenzaprine (FLEXERIL) 10 MG tablet Take 1 tablet (10 mg  total) by mouth 3 (three) times daily as needed for muscle spasms. 09/06/23   Claiborne Rigg, NP  fluticasone (FLONASE) 50 MCG/ACT nasal spray Place 2 sprays into both nostrils daily. 03/13/23   Viviano Simas, FNP  ibuprofen (ADVIL) 800 MG tablet Take 1 tablet (800 mg total) by mouth every 8 (eight) hours as needed. 09/06/23   Claiborne Rigg, NP  lidocaine (LIDODERM) 5 % Place 1 patch onto the skin daily. Remove & Discard patch within 12 hours or as directed by MD 07/12/23   [name], PA-C  montelukast (SINGULAIR) 10 MG tablet Take 1 tablet (10 mg total) by mouth daily. 10/31/22   Rodriguez-Southworth, Nettie Elm, PA-C  predniSONE (DELTASONE) 20 MG tablet Take 2 tablets (40 mg total) by mouth daily with breakfast. 05/05/23   [name], PA-C  cetirizine (ZYRTEC) 10 MG tablet Take 1 tablet (10 mg total) by mouth daily. 07/13/14 04/22/20  Charm Rings, MD  ipratropium (ATROVENT) 0.06 % nasal spray Place 2 sprays into both nostrils 4 (four) times daily. 05/03/18 04/22/20  Belinda Fisher, PA-C      Allergies    Other and Amoxicillin    Review of Systems   Review of Systems  All other systems reviewed and are negative.   Physical Exam Updated Vital Signs BP (!) 106/52   Pulse (!) 117   Temp 98.2 F (36.8 C) (Oral)   Resp 20   Ht 5\' 9"  (1.753 m)   Wt 104.3 kg   LMP 08/20/2023 (Approximate)   SpO2 97%   BMI 33.97 kg/m  Physical  Exam Vitals and nursing note reviewed.  Constitutional:      General: She is not in acute distress.    Appearance: She is well-developed.  HENT:     Head: Normocephalic and atraumatic.     Nose: No congestion or rhinorrhea.     Mouth/Throat:     Pharynx: No oropharyngeal exudate or posterior oropharyngeal erythema.  Eyes:     Conjunctiva/sclera: Conjunctivae normal.  Cardiovascular:     Rate and Rhythm: Normal rate and regular rhythm.     Heart sounds: No murmur heard. Pulmonary:     Effort: Pulmonary effort is normal. No respiratory distress.      Breath sounds: Normal breath sounds. No wheezing, rhonchi or rales.  Abdominal:     Palpations: Abdomen is soft.     Tenderness: There is no abdominal tenderness. There is no guarding.  Musculoskeletal:        General: No swelling.     Cervical back: Neck supple.     Right lower leg: No edema.     Left lower leg: No edema.  Skin:    General: Skin is warm and dry.     Capillary Refill: Capillary refill takes less than 2 seconds.  Neurological:     Mental Status: She is alert.  Psychiatric:        Mood and Affect: Mood normal.     ED Results / Procedures / Treatments   Labs (all labs ordered are listed, but only abnormal results are displayed) Labs Reviewed  RESP PANEL BY RT-PCR (RSV, FLU A&B, COVID)  [id number]    EKG None  Radiology No results found.  Procedures Procedures    Medications Ordered in ED Medications - No data to display  ED Course/ Medical Decision Making/ A&P                                 Medical Decision Making Risk Prescription drug management.   This patient presents to the ED for concern of cough, congestion, emesis, this involves an extensive number of treatment options, and is a complaint that carries with it a high risk of complications and morbidity.  The differential diagnosis includes COVID, flu, RSV, viral URI with cough, pneumonia, other   Co morbidities that complicate the patient evaluation  See HPI   Additional history obtained:  Additional history obtained from EMR External records from outside source obtained and reviewed including hospital records   Lab Tests:  I Ordered, and personally interpreted labs.  The pertinent results include: Viral testing negative   Imaging Studies ordered:  N/a   Cardiac Monitoring: / EKG:  The patient was maintained on a cardiac monitor.  I personally viewed and interpreted the cardiac monitored which showed an underlying rhythm of: sinus rhythm   Consultations  Obtained:  N/a   Problem List / ED Course / Critical interventions / Medication management  Viral URI with cough Reevaluation of the patient showed that the patient stayed the same I have reviewed the patients home medicines and have made adjustments as needed   Social Determinants of Health:  Marijuana use.  Denies other substance use.   Test / Admission - Considered:  Viral URI with cough Vitals signs significant for tachycardia. Otherwise within normal range and stable throughout visit. Laboratory studies significant for: See above 33 year old female presents emergency department with less than 24 hours of cough, congestion, body aches, fever.  1 episode of  emesis yesterday after eating shrimp at the [employer].  On exam, lungs clear to auscultation bilaterally; low suspicion for pneumonia.  No abdominal tenderness at all with only 1 episode emesis isolated yesterday and tolerated p.o. today.  Suspect patient's symptoms likely secondary to upper respiratory viral illness.  Patient with negative testing for COVID, flu, RSV.  Will recommend symptomatic therapy as described in AVS and close follow-up with PCP in the outpatient setting.  Treatment plan discussed length with patient and she acknowledged understanding was agreeable to said plan.  Patient overall well-appearing, afebrile in no acute distress. Worrisome signs and symptoms were discussed with the patient, and the patient acknowledged understanding to return to the ED if noticed. Patient was stable upon discharge.          Final Clinical Impression(s) / ED Diagnoses Final diagnoses:  Viral URI with cough    Rx / DC Orders ED Discharge Orders          Ordered    benzonatate (TESSALON) 100 MG capsule  3 times daily PRN        10/24/23 1140    ondansetron (ZOFRAN-ODT) 4 MG disintegrating tablet  Every 8 hours PRN        10/24/23 1140              Peter Garter, [place] 10/24/23 1143    Anders Simmonds  T, DO 10/26/23 [phone]

## 2023-10-30 NOTE — Therapy (Signed)
 [facility] [address] [place], [place], [address] Phone: [phone]   Fax:  [phone]  October 30, 2023   No Recipients  Physical Therapy Discharge Summary  Patient: Gail Mathews  MRN: [id number]  Date of Birth: [birth year]   Diagnosis: Abnormal posture No data recorded  The above patient had been seen in Physical Therapy 0 times of 0 treatments scheduled with 0 no shows and 1 cancellations.  The treatment consisted of no treatment The patient is:  cancelled  Subjective: Patient cancelled upon arrival  Discharge Findings: na  Functional Status at Discharge: na  No Goals Met    Sincerely,   Vernell Barrier, PT   CC No Recipients  [facility] [address] [place], [place], [address] Phone: [phone]   Fax:  [phone]  Patient: Gail Mathews  MRN: [id number]  Date of Birth: 10-Feb-1991

## 2023-11-05 ENCOUNTER — Telehealth

## 2023-11-06 ENCOUNTER — Telehealth

## 2023-11-10 ENCOUNTER — Telehealth: Payer: Self-pay | Admitting: Physician Assistant

## 2023-11-10 DIAGNOSIS — J301 Allergic rhinitis due to pollen: Secondary | ICD-10-CM

## 2023-11-10 DIAGNOSIS — J4541 Moderate persistent asthma with (acute) exacerbation: Secondary | ICD-10-CM

## 2023-11-10 MED ORDER — ALBUTEROL SULFATE HFA 108 (90 BASE) MCG/ACT IN AERS: 1.0000 | INHALATION_SPRAY | Freq: Four times a day (QID) | RESPIRATORY_TRACT | 0 refills | Status: DC | PRN

## 2023-11-10 MED ORDER — MONTELUKAST SODIUM 10 MG PO TABS: 10.0000 mg | ORAL_TABLET | Freq: Every day | ORAL | 0 refills | Status: DC

## 2023-11-10 MED ORDER — PREDNISONE 20 MG PO TABS: 40.0000 mg | ORAL_TABLET | Freq: Every day | ORAL | 0 refills | Status: DC

## 2023-11-10 MED ORDER — CETIRIZINE-PSEUDOEPHEDRINE ER 5-120 MG PO TB12: 1.0000 | ORAL_TABLET | Freq: Two times a day (BID) | ORAL | 0 refills | Status: DC

## 2023-11-10 NOTE — Patient Instructions (Signed)
 Gail Mathews, thank you for joining [name], PA-C for today's virtual visit.  While this provider is not your primary care provider (PCP), if your PCP is located in our provider database this encounter information will be shared with them immediately following your visit.   A [facility] MyChart account gives you access to today's visit and all your visits, tests, and labs performed at [facility] " click here if you don't have a [name] or go to [email or web address]  Consent: (Patient) Gail Mathews provided verbal consent for this virtual visit at the beginning of the encounter.  Current Medications:  Current Outpatient Medications:    albuterol (VENTOLIN HFA) 108 (90 Base) MCG/ACT inhaler, Inhale 1-2 puffs into the lungs every 6 (six) hours as needed., Disp: 8 g, Rfl: 0   predniSONE (DELTASONE) 20 MG tablet, Take 2 tablets (40 mg total) by mouth daily with breakfast., Disp: 14 tablet, Rfl: 0   albuterol (PROVENTIL) (2.5 MG/3ML) 0.083% nebulizer solution, Take 3 mLs (2.5 mg total) by nebulization every 4 (four) hours as needed for wheezing or shortness of breath., Disp: 75 mL, Rfl: 0   benzonatate (TESSALON) 100 MG capsule, Take 1 capsule (100 mg total) by mouth 3 (three) times daily as needed., Disp: 21 capsule, Rfl: 0   cetirizine-pseudoephedrine (ZYRTEC-D) 5-120 MG tablet, Take 1 tablet by mouth 2 (two) times daily., Disp: 30 tablet, Rfl: 0   cyclobenzaprine (FLEXERIL) 10 MG tablet, Take 1 tablet (10 mg total) by mouth 3 (three) times daily as needed for muscle spasms., Disp: 60 tablet, Rfl: 0   fluticasone (FLONASE) 50 MCG/ACT nasal spray, Place 2 sprays into both nostrils daily., Disp: 16 g, Rfl: 6   ibuprofen (ADVIL) 800 MG tablet, Take 1 tablet (800 mg total) by mouth every 8 (eight) hours as needed., Disp: 60 tablet, Rfl: 0   lidocaine (LIDODERM) 5 %, Place 1 patch onto the skin daily. Remove & Discard patch within 12 hours or as  directed by MD, Disp: 30 patch, Rfl: 0   montelukast (SINGULAIR) 10 MG tablet, Take 1 tablet (10 mg total) by mouth daily., Disp: 30 tablet, Rfl: 0   ondansetron (ZOFRAN-ODT) 4 MG disintegrating tablet, Take 1 tablet (4 mg total) by mouth every 8 (eight) hours as needed., Disp: 20 tablet, Rfl: 0   Medications ordered in this encounter:  Meds ordered this encounter  Medications   predniSONE (DELTASONE) 20 MG tablet    Sig: Take 2 tablets (40 mg total) by mouth daily with breakfast.    Dispense:  14 tablet    Refill:  0    Supervising Provider:   Merrilee Jansky [[id number]]   albuterol (VENTOLIN HFA) 108 (90 Base) MCG/ACT inhaler    Sig: Inhale 1-2 puffs into the lungs every 6 (six) hours as needed.    Dispense:  8 g    Refill:  0    Supervising Provider:   Merrilee Jansky [[id number]]   cetirizine-pseudoephedrine (ZYRTEC-D) 5-120 MG tablet    Sig: Take 1 tablet by mouth 2 (two) times daily.    Dispense:  30 tablet    Refill:  0    Supervising Provider:   Merrilee Jansky [[id number]]   montelukast (SINGULAIR) 10 MG tablet    Sig: Take 1 tablet (10 mg total) by mouth daily.    Dispense:  30 tablet    Refill:  0    Supervising Provider:   Merrilee Jansky [id number]     *If  you need refills on other medications prior to your next appointment, please contact your pharmacy*  Follow-Up: Call back or seek an in-person evaluation if the symptoms worsen or if the condition fails to improve as anticipated.  [facility] [phone]  Other Instructions  Asthma, Adult  Asthma is a long-term (chronic) condition that causes recurrent episodes in which the lower airways in the lungs become tight and narrow. The narrowing is caused by inflammation and tightening of the smooth muscle around the lower airways. Asthma episodes, also called asthma attacks or asthma flares, may cause coughing, making high-pitched whistling sounds when you breathe, most often when you breathe out  (wheezing), shortness of breath, and chest pain. The airways may produce extra mucus caused by the inflammation and irritation. During an attack, it can be difficult to breathe. Asthma attacks can range from minor to life-threatening. Asthma cannot be cured, but medicines and lifestyle changes can help control it and treat acute attacks. It is important to keep your asthma well controlled so the condition does not interfere with your daily life. What are the causes? This condition is believed to be caused by inherited (genetic) and environmental factors, but its exact cause is not known. What can trigger an asthma attack? Many things can bring on an asthma attack or make symptoms worse. These triggers are different for every person. Common triggers include: Allergens and irritants like mold, dust, pet dander, cockroaches, pollen, air pollution, and chemical odors. Cigarette smoke. Weather changes and cold air. Stress and strong emotional responses such as crying or laughing hard. Certain medications such as aspirin or beta blockers. Infections and inflammatory conditions, such as the flu, a cold, pneumonia, or inflammation of the nasal membranes (rhinitis). Gastroesophageal reflux disease (GERD). What are the signs or symptoms? Symptoms may occur right after exposure to an asthma trigger or hours later and can vary by person. Common signs and symptoms include: Wheezing. Trouble breathing (shortness of breath). Excessive nighttime or early morning coughing. Chest tightness. Tiredness (fatigue) with minimal activity. Difficulty talking in complete sentences. Poor exercise tolerance. How is this diagnosed? This condition is diagnosed based on: A physical exam and your medical history. Tests, which may include: Lung function studies to evaluate the flow of air in your lungs. Allergy tests. Imaging tests, such as X-rays. How is this treated? There is no cure, but symptoms can be controlled  with proper treatment. Treatment usually involves: Identifying and avoiding your asthma triggers. Inhaled medicines. Two types are commonly used to treat asthma, depending on severity: Controller medicines. These help prevent asthma symptoms from occurring. They are taken every day. Fast-acting reliever or rescue medicines. These quickly relieve asthma symptoms. They are used as needed and provide short-term relief. Using other medicines, such as: Allergy medicines, such as antihistamines, if your asthma attacks are triggered by allergens. Immune medicines (immunomodulators). These are medicines that help control the immune system. Using supplemental oxygen. This is only needed during a severe episode. Creating an asthma action plan. An asthma action plan is a written plan for managing and treating your asthma attacks. This plan includes: A list of your asthma triggers and how to avoid them. Information about when medicines should be taken and when their dosage should be changed. Instructions about using a device called a peak flow meter. A peak flow meter measures how well the lungs are working and the severity of your asthma. It helps you monitor your condition. Follow these instructions at home: Take over-the-counter and  prescription medicines only as told by your health care provider. Stay up to date on all vaccinations as recommended by your healthcare provider, including vaccines for the flu and pneumonia. Use a peak flow meter and keep track of your peak flow readings. Understand and use your asthma action plan to address any asthma flares. Do not smoke or allow anyone to smoke in your home. Contact a health care provider if: You have wheezing, shortness of breath, or a cough that is not responding to medicines. Your medicines are causing side effects, such as a rash, itching, swelling, or trouble breathing. You need to use a reliever medicine more than 2-3 times a week. Your peak flow  reading is still at 50-79% of your personal best after following your action plan for 1 hour. You have a fever and shortness of breath. Get help right away if: You are getting worse and do not respond to treatment during an asthma attack. You are short of breath when at rest or when doing very little physical activity. You have difficulty eating, drinking, or talking. You have chest pain or tightness. You develop a fast heartbeat or palpitations. You have a bluish color to your lips or fingernails. You are light-headed or dizzy, or you faint. Your peak flow reading is less than 50% of your personal best. You feel too tired to breathe normally. These symptoms may be an emergency. Get help right away. Call 911. Do not wait to see if the symptoms will go away. Do not drive yourself to the hospital. Summary Asthma is a long-term (chronic) condition that causes recurrent episodes in which the airways become tight and narrow. Asthma episodes, also called asthma attacks or asthma flares, can cause coughing, wheezing, shortness of breath, and chest pain. Asthma cannot be cured, but medicines and lifestyle changes can help keep it well controlled and prevent asthma flares. Make sure you understand how to avoid triggers and how and when to use your medicines. Asthma attacks can range from minor to life-threatening. Get help right away if you have an asthma attack and do not respond to treatment with your usual rescue medicines. This information is not intended to replace advice given to you by your health care provider. Make sure you discuss any questions you have with your health care provider. Document Revised: 06/06/2021 Document Reviewed: 05/28/2021 Elsevier Patient Education  2024 Elsevier Inc.   If you have been instructed to have an in-person evaluation today at a local Urgent Care facility, please use the link below. It will take you to a list of all of our available [facility],  including address, phone number and hours of operation. Please do not delay care.  [facility]  If you or a family member do not have a primary care provider, use the link below to schedule a visit and establish care. When you choose a [facility] primary care physician or advanced practice provider, you gain a long-term partner in health. Find a Primary Care Provider  Learn more about [facility] in-office and virtual care options: [facility] - Get Care Now

## 2023-11-10 NOTE — Progress Notes (Signed)
 Virtual Visit Consent   Gail Mathews, you are scheduled for a virtual visit with a [facility] provider today. Just as with appointments in the office, your consent must be obtained to participate. Your consent will be active for this visit and any virtual visit you may have with one of our providers in the next 365 days. If you have a MyChart account, a copy of this consent can be sent to you electronically.  As this is a virtual visit, video technology does not allow for your provider to perform a traditional examination. This may limit your provider's ability to fully assess your condition. If your provider identifies any concerns that need to be evaluated in person or the need to arrange testing (such as labs, EKG, etc.), we will make arrangements to do so. Although advances in technology are sophisticated, we cannot ensure that it will always work on either your end or our end. If the connection with a video visit is poor, the visit may have to be switched to a telephone visit. With either a video or telephone visit, we are not always able to ensure that we have a secure connection.  By engaging in this virtual visit, you consent to the provision of healthcare and authorize for your insurance to be billed (if applicable) for the services provided during this visit. Depending on your insurance coverage, you may receive a charge related to this service.  I need to obtain your verbal consent now. Are you willing to proceed with your visit today? Gail Mathews has provided verbal consent on 11/10/2023 for a virtual visit (video or telephone). [name], PA-C  Date: 11/10/2023 10:34 AM   Virtual Visit via Video Note   I, [name], connected with  Gail Mathews  ([id number], 07/05/1991) on 11/10/23 at 10:30 AM EDT by a video-enabled telemedicine application and verified that I am speaking with the correct person using two identifiers.  Location: Patient: Virtual Visit  Location Patient: Home Provider: Virtual Visit Location Provider: Home Office   I discussed the limitations of evaluation and management by telemedicine and the availability of in person appointments. The patient expressed understanding and agreed to proceed.    History of Present Illness: Gail Mathews is a 33 y.o. who identifies as a female who was assigned female at birth, and is being seen today for asthma.  HPI: Asthma She complains of chest tightness, cough, sputum production and wheezing. There is no hoarse voice. This is a recurrent problem. The current episode started in the past 7 days. The problem occurs constantly. The problem has been gradually worsening. Her symptoms are aggravated by emotional stress (dehydrated). Her symptoms are alleviated by beta-agonist and prescription cough suppressant (tessalon). She reports minimal improvement on treatment. Her past medical history is significant for asthma.  Had a syncopal episode about 2-3 days ago at work. EMS called and told her she was dehydrated.  Having URI symptoms and allergy symptoms flared with season change causing increased post nasal drainage and nausea.   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   Asthma 06/08/2011    Allergies:  Allergies  Allergen Reactions   Other Swelling    Pecans/walnuts Pecans/walnuts   Amoxicillin Rash   Medications:  Current Outpatient Medications:    albuterol (VENTOLIN HFA) 108 (90 Base) MCG/ACT inhaler, Inhale 1-2 puffs into the lungs every 6 (six) hours as needed., Disp: 8 g, Rfl: 0  predniSONE (DELTASONE) 20 MG tablet, Take 2 tablets (40 mg total) by mouth daily with breakfast., Disp: 14 tablet, Rfl: 0   albuterol (PROVENTIL) (2.5 MG/3ML) 0.083% nebulizer solution, Take 3 mLs (2.5 mg total) by nebulization every 4 (four) hours as needed for wheezing or shortness of breath., Disp: 75 mL, Rfl: 0   benzonatate  (TESSALON) 100 MG capsule, Take 1 capsule (100 mg total) by mouth 3 (three) times daily as needed., Disp: 21 capsule, Rfl: 0   cetirizine-pseudoephedrine (ZYRTEC-D) 5-120 MG tablet, Take 1 tablet by mouth 2 (two) times daily., Disp: 30 tablet, Rfl: 0   cyclobenzaprine (FLEXERIL) 10 MG tablet, Take 1 tablet (10 mg total) by mouth 3 (three) times daily as needed for muscle spasms., Disp: 60 tablet, Rfl: 0   fluticasone (FLONASE) 50 MCG/ACT nasal spray, Place 2 sprays into both nostrils daily., Disp: 16 g, Rfl: 6   ibuprofen (ADVIL) 800 MG tablet, Take 1 tablet (800 mg total) by mouth every 8 (eight) hours as needed., Disp: 60 tablet, Rfl: 0   lidocaine (LIDODERM) 5 %, Place 1 patch onto the skin daily. Remove & Discard patch within 12 hours or as directed by MD, Disp: 30 patch, Rfl: 0   montelukast (SINGULAIR) 10 MG tablet, Take 1 tablet (10 mg total) by mouth daily., Disp: 30 tablet, Rfl: 0   ondansetron (ZOFRAN-ODT) 4 MG disintegrating tablet, Take 1 tablet (4 mg total) by mouth every 8 (eight) hours as needed., Disp: 20 tablet, Rfl: 0  Observations/Objective: Patient is well-developed, well-nourished in no acute distress.  Resting comfortably  Head is normocephalic, atraumatic.  No labored breathing.  Speech is clear and coherent with logical content.  Patient is alert and oriented at baseline.    Assessment and Plan: 1. Moderate persistent asthma with acute exacerbation (Primary) - predniSONE (DELTASONE) 20 MG tablet; Take 2 tablets (40 mg total) by mouth daily with breakfast.  Dispense: 14 tablet; Refill: 0 - albuterol (VENTOLIN HFA) 108 (90 Base) MCG/ACT inhaler; Inhale 1-2 puffs into the lungs every 6 (six) hours as needed.  Dispense: 8 g; Refill: 0 - montelukast (SINGULAIR) 10 MG tablet; Take 1 tablet (10 mg total) by mouth daily.  Dispense: 30 tablet; Refill: 0  2. Seasonal allergic rhinitis due to pollen - predniSONE (DELTASONE) 20 MG tablet; Take 2 tablets (40 mg total) by mouth  daily with breakfast.  Dispense: 14 tablet; Refill: 0 - cetirizine-pseudoephedrine (ZYRTEC-D) 5-120 MG tablet; Take 1 tablet by mouth 2 (two) times daily.  Dispense: 30 tablet; Refill: 0 - montelukast (SINGULAIR) 10 MG tablet; Take 1 tablet (10 mg total) by mouth daily.  Dispense: 30 tablet; Refill: 0  - Prednisone for asthma exacerbation - Albuterol and Montelukast refilled - Zyrtec-D refilled for allergies - Continue Flonase - Saline nasal rinses - Steam and humidifier can help - Use nebulizer as needed - Follow up in person if symptoms worsen or fail to improve  Follow Up Instructions: I discussed the assessment and treatment plan with the patient. The patient was provided an opportunity to ask questions and all were answered. The patient agreed with the plan and demonstrated an understanding of the instructions.  A copy of instructions were sent to the patient via MyChart unless otherwise noted below.    The patient was advised to call back or seek an in-person evaluation if the symptoms worsen or if the condition fails to improve as anticipated.    [name], PA-C

## 2023-11-19 ENCOUNTER — Encounter: Payer: Self-pay | Admitting: Physician Assistant

## 2023-11-19 ENCOUNTER — Telehealth: Payer: Self-pay | Admitting: Physician Assistant

## 2023-11-19 DIAGNOSIS — T50Z95A Adverse effect of other vaccines and biological substances, initial encounter: Secondary | ICD-10-CM

## 2023-11-19 NOTE — Progress Notes (Addendum)
 Virtual Visit Consent   Gail Mathews, you are scheduled for a virtual visit with a [facility] provider today. Just as with appointments in the office, your consent must be obtained to participate. Your consent will be active for this visit and any virtual visit you may have with one of our providers in the next 365 days. If you have a MyChart account, a copy of this consent can be sent to you electronically.  As this is a virtual visit, video technology does not allow for your provider to perform a traditional examination. This may limit your provider's ability to fully assess your condition. If your provider identifies any concerns that need to be evaluated in person or the need to arrange testing (such as labs, EKG, etc.), we will make arrangements to do so. Although advances in technology are sophisticated, we cannot ensure that it will always work on either your end or our end. If the connection with a video visit is poor, the visit may have to be switched to a telephone visit. With either a video or telephone visit, we are not always able to ensure that we have a secure connection.  By engaging in this virtual visit, you consent to the provision of healthcare and authorize for your insurance to be billed (if applicable) for the services provided during this visit. Depending on your insurance coverage, you may receive a charge related to this service.  I need to obtain your verbal consent now. Are you willing to proceed with your visit today? Gail Mathews has provided verbal consent on 11/19/2023 for a virtual visit (video or telephone). Gilberto Better, [place]  Date: 11/19/2023 6:32 PM   Virtual Visit via Video Note   I, Asra Gambrel, connected with  Gail Mathews  ([id number], [birth year]) on 11/19/23 at  5:15 PM EDT by a video-enabled telemedicine application and verified that I am speaking with the correct person using two identifiers.  Location: Patient: Virtual Visit Location Patient:  Home Provider: Virtual Visit Location Provider: Home Office   I discussed the limitations of evaluation and management by telemedicine and the availability of in person appointments. The patient expressed understanding and agreed to proceed.    History of Present Illness: Gail Mathews is a 33 y.o. who identifies as a female who was assigned female at birth, and is being seen today for reaction to vaccine earlier today.  HPI: 33 y/o F presents for a video visit for c/o arm soreness and feeling achy. Had flu vaccine earlier today [facility] and after about 1-2 hours started to feel this way. Had flu vaccine when she was much younger, but not sure if she reacted the same way.     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   Asthma 06/08/2011    Allergies:  Allergies  Allergen Reactions   Other Swelling    Pecans/walnuts Pecans/walnuts   Amoxicillin Rash   Medications:  Current Outpatient Medications:    albuterol (PROVENTIL) (2.5 MG/3ML) 0.083% nebulizer solution, Take 3 mLs (2.5 mg total) by nebulization every 4 (four) hours as needed for wheezing or shortness of breath., Disp: 75 mL, Rfl: 0   albuterol (VENTOLIN HFA) 108 (90 Base) MCG/ACT inhaler, Inhale 1-2 puffs into the lungs every 6 (six) hours as needed., Disp: 8 g, Rfl: 0   benzonatate (TESSALON) 100 MG capsule, Take 1 capsule (100 mg total) by mouth 3 (three) times daily as needed.,  Disp: 21 capsule, Rfl: 0   cetirizine-pseudoephedrine (ZYRTEC-D) 5-120 MG tablet, Take 1 tablet by mouth 2 (two) times daily., Disp: 30 tablet, Rfl: 0   cyclobenzaprine (FLEXERIL) 10 MG tablet, Take 1 tablet (10 mg total) by mouth 3 (three) times daily as needed for muscle spasms., Disp: 60 tablet, Rfl: 0   fluticasone (FLONASE) 50 MCG/ACT nasal spray, Place 2 sprays into both nostrils daily., Disp: 16 g, Rfl: 6   ibuprofen (ADVIL) 800 MG tablet, Take 1 tablet (800 mg  total) by mouth every 8 (eight) hours as needed., Disp: 60 tablet, Rfl: 0   lidocaine (LIDODERM) 5 %, Place 1 patch onto the skin daily. Remove & Discard patch within 12 hours or as directed by MD, Disp: 30 patch, Rfl: 0   montelukast (SINGULAIR) 10 MG tablet, Take 1 tablet (10 mg total) by mouth daily., Disp: 30 tablet, Rfl: 0   ondansetron (ZOFRAN-ODT) 4 MG disintegrating tablet, Take 1 tablet (4 mg total) by mouth every 8 (eight) hours as needed., Disp: 20 tablet, Rfl: 0   predniSONE (DELTASONE) 20 MG tablet, Take 2 tablets (40 mg total) by mouth daily with breakfast., Disp: 14 tablet, Rfl: 0  Observations/Objective: Patient is well-developed, well-nourished in no acute distress.  Resting comfortably  at home.  Head is normocephalic, atraumatic.  No labored breathing.  Speech is clear and coherent with logical content.  Patient is alert and oriented at baseline.    Assessment and Plan: 1. Adverse effect of vaccine, initial encounter (Primary)  Apply ice pack to the injection site. Start Ibuprofen alternating with Tylenol every 4 hours for pain and fever as needed. Continue to watch for worsening symptoms. Schedule another virtual visit if symptoms don't improve.  Pt verbalized understanding and in agreement.     Follow Up Instructions: I discussed the assessment and treatment plan with the patient. The patient was provided an opportunity to ask questions and all were answered. The patient agreed with the plan and demonstrated an understanding of the instructions.  A copy of instructions were sent to the patient via MyChart unless otherwise noted below.   Patient has requested to receive PHI (AVS, Work Notes, etc) pertaining to this video visit through e-mail as they are currently without active MyChart. They have voiced understand that email is not considered secure and their health information could be viewed by someone other than the patient.   The patient was advised to call back  or seek an in-person evaluation if the symptoms worsen or if the condition fails to improve as anticipated.    Gilberto Better, PA-C

## 2023-11-19 NOTE — Patient Instructions (Signed)
 [name], thank you for joining Gilberto Better, PA-C for today's virtual visit.  While this provider is not your primary care provider (PCP), if your PCP is located in our provider database this encounter information will be shared with them immediately following your visit.   A [facility] MyChart account gives you access to today's visit and all your visits, tests, and labs performed at [facility] " click here if you don't have a [facility] MyChart account or go to [email or web address]  Consent: (Patient) [name] provided verbal consent for this virtual visit at the beginning of the encounter.  Current Medications:  Current Outpatient Medications:    albuterol (PROVENTIL) (2.5 MG/3ML) 0.083% nebulizer solution, Take 3 mLs (2.5 mg total) by nebulization every 4 (four) hours as needed for wheezing or shortness of breath., Disp: 75 mL, Rfl: 0   albuterol (VENTOLIN HFA) 108 (90 Base) MCG/ACT inhaler, Inhale 1-2 puffs into the lungs every 6 (six) hours as needed., Disp: 8 g, Rfl: 0   benzonatate (TESSALON) 100 MG capsule, Take 1 capsule (100 mg total) by mouth 3 (three) times daily as needed., Disp: 21 capsule, Rfl: 0   cetirizine-pseudoephedrine (ZYRTEC-D) 5-120 MG tablet, Take 1 tablet by mouth 2 (two) times daily., Disp: 30 tablet, Rfl: 0   cyclobenzaprine (FLEXERIL) 10 MG tablet, Take 1 tablet (10 mg total) by mouth 3 (three) times daily as needed for muscle spasms., Disp: 60 tablet, Rfl: 0   fluticasone (FLONASE) 50 MCG/ACT nasal spray, Place 2 sprays into both nostrils daily., Disp: 16 g, Rfl: 6   ibuprofen (ADVIL) 800 MG tablet, Take 1 tablet (800 mg total) by mouth every 8 (eight) hours as needed., Disp: 60 tablet, Rfl: 0   lidocaine (LIDODERM) 5 %, Place 1 patch onto the skin daily. Remove & Discard patch within 12 hours or as directed by MD, Disp: 30 patch, Rfl: 0   montelukast (SINGULAIR) 10 MG tablet, Take 1 tablet (10 mg total) by mouth daily., Disp: 30  tablet, Rfl: 0   ondansetron (ZOFRAN-ODT) 4 MG disintegrating tablet, Take 1 tablet (4 mg total) by mouth every 8 (eight) hours as needed., Disp: 20 tablet, Rfl: 0   predniSONE (DELTASONE) 20 MG tablet, Take 2 tablets (40 mg total) by mouth daily with breakfast., Disp: 14 tablet, Rfl: 0   Medications ordered in this encounter:  No orders of the defined types were placed in this encounter.    *If you need refills on other medications prior to your next appointment, please contact your pharmacy*  Follow-Up: Call back or seek an in-person evaluation if the symptoms worsen or if the condition fails to improve as anticipated.  [facility] [phone]  Other Instructions Apply ice pack to the injection site. Start Ibuprofen alternating with Tylenol every 4 hours for pain and fever as needed. Continue to watch for worsening symptoms. Schedule another virtual visit if symptoms don't improve.    If you have been instructed to have an in-person evaluation today at a local Urgent Care facility, please use the link below. It will take you to a list of all of our available [facility], including address, phone number and hours of operation. Please do not delay care.  [facility]  If you or a family member do not have a primary care provider, use the link below to schedule a visit and establish care. When you choose a [facility] primary care physician or advanced practice provider, you gain  a long-term partner in health. Find a Primary Care Provider  Learn more about [facility]'s in-office and virtual care options: [facility] - Get Care Now

## 2023-12-03 ENCOUNTER — Telehealth: Payer: Self-pay | Admitting: Physician Assistant

## 2023-12-03 DIAGNOSIS — Z91199 Patient's noncompliance with other medical treatment and regimen due to unspecified reason: Secondary | ICD-10-CM

## 2023-12-03 NOTE — Progress Notes (Signed)
 The patient no-showed for appointment despite this provider sending direct link with no response and waiting for at least 10 minutes from appointment time for patient to join. They will be marked as a NS for this appointment/time.   Piedad Climes, PA-C

## 2023-12-23 ENCOUNTER — Telehealth: Payer: Self-pay | Admitting: Physician Assistant

## 2023-12-23 DIAGNOSIS — J3489 Other specified disorders of nose and nasal sinuses: Secondary | ICD-10-CM

## 2023-12-23 DIAGNOSIS — J301 Allergic rhinitis due to pollen: Secondary | ICD-10-CM

## 2023-12-23 DIAGNOSIS — J4541 Moderate persistent asthma with (acute) exacerbation: Secondary | ICD-10-CM

## 2023-12-23 MED ORDER — ALBUTEROL SULFATE HFA 108 (90 BASE) MCG/ACT IN AERS: 1.0000 | INHALATION_SPRAY | Freq: Four times a day (QID) | RESPIRATORY_TRACT | 0 refills | Status: DC | PRN

## 2023-12-23 MED ORDER — MONTELUKAST SODIUM 10 MG PO TABS: 10.0000 mg | ORAL_TABLET | Freq: Every day | ORAL | 0 refills | Status: DC

## 2023-12-23 MED ORDER — IBUPROFEN 800 MG PO TABS: 800.0000 mg | ORAL_TABLET | Freq: Three times a day (TID) | ORAL | 0 refills | Status: DC | PRN

## 2023-12-23 MED ORDER — CETIRIZINE-PSEUDOEPHEDRINE ER 5-120 MG PO TB12: 1.0000 | ORAL_TABLET | Freq: Two times a day (BID) | ORAL | 0 refills | Status: DC

## 2023-12-23 NOTE — Patient Instructions (Signed)
 Gail Mathews, thank you for joining Angelia Kelp, PA-C for today's virtual visit.  While this provider is not your primary care provider (PCP), if your PCP is located in our provider database this encounter information will be shared with them immediately following your visit.   A [facility] MyChart account gives you access to today's visit and all your visits, tests, and labs performed at [facility] " click here if you don't have a [facility] MyChart account or go to [email or web address]  Consent: (Patient) Gail Mathews provided verbal consent for this virtual visit at the beginning of the encounter.  Current Medications:  Current Outpatient Medications:    albuterol  (PROVENTIL ) (2.5 MG/3ML) 0.083% nebulizer solution, Take 3 mLs (2.5 mg total) by nebulization every 4 (four) hours as needed for wheezing or shortness of breath., Disp: 75 mL, Rfl: 0   albuterol  (VENTOLIN  HFA) 108 (90 Base) MCG/ACT inhaler, Inhale 1-2 puffs into the lungs every 6 (six) hours as needed., Disp: 18 g, Rfl: 0   cetirizine -pseudoephedrine  (ZYRTEC -D) 5-120 MG tablet, Take 1 tablet by mouth 2 (two) times daily., Disp: 90 tablet, Rfl: 0   cyclobenzaprine  (FLEXERIL ) 10 MG tablet, Take 1 tablet (10 mg total) by mouth 3 (three) times daily as needed for muscle spasms., Disp: 60 tablet, Rfl: 0   fluticasone  (FLONASE ) 50 MCG/ACT nasal spray, Place 2 sprays into both nostrils daily., Disp: 16 g, Rfl: 6   ibuprofen  (ADVIL ) 800 MG tablet, Take 1 tablet (800 mg total) by mouth every 8 (eight) hours as needed., Disp: 60 tablet, Rfl: 0   lidocaine  (LIDODERM ) 5 %, Place 1 patch onto the skin daily. Remove & Discard patch within 12 hours or as directed by MD, Disp: 30 patch, Rfl: 0   montelukast  (SINGULAIR ) 10 MG tablet, Take 1 tablet (10 mg total) by mouth daily., Disp: 90 tablet, Rfl: 0   ondansetron  (ZOFRAN -ODT) 4 MG disintegrating tablet, Take 1 tablet (4 mg total) by mouth every 8 (eight) hours as  needed., Disp: 20 tablet, Rfl: 0   Medications ordered in this encounter:  Meds ordered this encounter  Medications   cetirizine -pseudoephedrine  (ZYRTEC -D) 5-120 MG tablet    Sig: Take 1 tablet by mouth 2 (two) times daily.    Dispense:  90 tablet    Refill:  0    Supervising Provider:   LAMPTEY, PHILIP O [[id number]]   ibuprofen  (ADVIL ) 800 MG tablet    Sig: Take 1 tablet (800 mg total) by mouth every 8 (eight) hours as needed.    Dispense:  60 tablet    Refill:  0    Supervising Provider:   LAMPTEY, PHILIP O [[id number]]   montelukast  (SINGULAIR ) 10 MG tablet    Sig: Take 1 tablet (10 mg total) by mouth daily.    Dispense:  90 tablet    Refill:  0    Supervising Provider:   LAMPTEY, PHILIP O [[id number]]   albuterol  (VENTOLIN  HFA) 108 (90 Base) MCG/ACT inhaler    Sig: Inhale 1-2 puffs into the lungs every 6 (six) hours as needed.    Dispense:  18 g    Refill:  0    Supervising Provider:   Corine Dice [[id number]]     *If you need refills on other medications prior to your next appointment, please contact your pharmacy*  Follow-Up: Call back or seek an in-person evaluation if the symptoms worsen or if the condition fails to improve as anticipated.  [facility] [phone]  Other  Instructions  Allergies, Adult An allergy  is a condition that causes the body's defense system (immune system) to react too strongly to an allergen. An allergen is a substance that is harmless to most people but can cause a reaction in some people. Allergies often affect the nose (allergic rhinitis), eyes (conjunctivitis), skin (atopic dermatitis), and stomach. They can be mild, moderate, or severe. They cannot spread from person to person. Allergies can start at any age. In some cases, they may go away as you get older. What are the causes? Allergies are caused by allergens. These may be: Outdoor allergens. These include pollen, car fumes, and mold. Indoor allergens. These include dust,  smoke, mold, and pet dander. Other allergens. These include foods, medicines, scents, and insect bites or stings. What increases the risk? You are more likely to have allergies if you have: Family members with allergies. Family members who have a condition that may be caused by allergens, such as asthma. What are the signs or symptoms? Symptoms depend on how severe your allergy  is. Mild to moderate symptoms Runny nose, stuffy nose (nasal congestion), or sneezing. Itchy mouth, ears, or throat. Postnasal drip. This is a feeling of mucus dripping down the back of your throat. Sore throat. Itchy, red, watery, or puffy eyes. Skin rash, or itchy, red, swollen areas of skin (hives). Stomach cramps or bloating. Severe symptoms A bad allergy  to food, medicine, or insect bites may cause a severe reaction (anaphylactic reaction). Symptoms include: A red face. Coughing or making high-pitched whistling sounds when you breathe, most often when you breathe out (wheezing). Swollen lips, tongue, or mouth. A tight or swollen throat. Chest pain or tightness, or a fast heartbeat. Trouble breathing or shortness of breath. Pain in your abdomen. Vomiting or diarrhea. Feeling dizzy or fainting. How is this diagnosed? Allergies are diagnosed based on your symptoms, your family and medical history, and a physical exam. You may also have tests, such as: Skin tests. These may be done to see how your skin reacts to allergens. Tests include: Skin prick test. For this test, the allergen is put in your body through a small prick in the skin. Intradermal skin test. For this test, a small amount of the allergen is put under the first layer of your skin. Patch test. For this test, a small amount of the allergen is placed on your skin. The area is covered and then checked after a few days. Blood tests. A challenge test. For this test, you eat or breathe in the allergen to see if you have a reaction. You may also be  asked to: Keep a food diary. This means writing down all the foods, drinks, and symptoms you have in a day. Try an elimination diet. To do this: Stop eating certain foods. Add those foods back one by one to find out if any of them cause a reaction. How is this treated?     Treatment for allergies depends on your symptoms. It may include: Cold, wet cloths (cold compresses). These can be used to soothe itching and swelling. Eye drops or nasal sprays. A saline solution to clear out your nose and keep it moist (nasal irrigation). A saline solution is made of salt and water. A humidifier. This can add moisture to the air. Skin creams. These can treat rashes or itching. Diet changes to cut out foods that cause allergies. Being exposed again and again to tiny amounts of allergens. This can help your body build a defense against them (  tolerance). This process is called immunotherapy. It may be done using: Allergy  shots. This is when you get a shot of the allergen. Sublingual immunotherapy. This is when you take a small dose of the allergen under your tongue. Allergy  medicines (antihistamines) or other medicines. These can help block the allergic reaction. Using an auto-injector pen. An auto-injector pen is a device filled with medicine that gives an emergency shot of epinephrine. Your health care provider will teach you how to use it. Follow these instructions at home: Medicines  Take or apply over-the-counter and prescription medicines only as told by your provider. Always carry your auto-injector pen if you are at risk of an anaphylactic reaction. Give yourself the shot as told by your provider. Eating and drinking Follow instructions from your provider about what you may eat and drink. Drink enough fluid to keep your pee (urine) pale yellow. General instructions Wear a medical alert bracelet or necklace if you have had an anaphylactic reaction in the past. Avoid known allergens when you  can. Keep all follow-up visits. Your provider will watch your symptoms and talk about treatment options with you. Contact a health care provider if: Your symptoms do not get better with treatment. Get help right away if: You have any symptoms of anaphylactic reaction. You use an auto-injector pen. You will need more medical care even if the medicine seems to be working. An anaphylactic reaction may happen again within 72 hours (rebound anaphylaxis). These symptoms may be an emergency. Use the auto-injector pen right away. Then call 911. Do not wait to see if the symptoms will go away. Do not drive yourself to the hospital. This information is not intended to replace advice given to you by your health care provider. Make sure you discuss any questions you have with your health care provider. Document Revised: 05/01/2022 Document Reviewed: 05/01/2022 Elsevier Patient Education  2024 Elsevier Inc.   If you have been instructed to have an in-person evaluation today at a local Urgent Care facility, please use the link below. It will take you to a list of all of our available [facility], including address, phone number and hours of operation. Please do not delay care.  [facility]  If you or a family member do not have a primary care provider, use the link below to schedule a visit and establish care. When you choose a [facility] primary care physician or advanced practice provider, you gain a long-term partner in health. Find a Primary Care Provider  Learn more about [facility]'s in-office and virtual care options: [facility] Get Care Now

## 2023-12-23 NOTE — Progress Notes (Signed)
 Virtual Visit Consent   Gail Mathews, you are scheduled for a virtual visit with a [facility] provider today. Just as with appointments in the office, your consent must be obtained to participate. Your consent will be active for this visit and any virtual visit you may have with one of our providers in the next 365 days. If you have a MyChart account, a copy of this consent can be sent to you electronically.  As this is a virtual visit, video technology does not allow for your provider to perform a traditional examination. This may limit your provider's ability to fully assess your condition. If your provider identifies any concerns that need to be evaluated in person or the need to arrange testing (such as labs, EKG, etc.), we will make arrangements to do so. Although advances in technology are sophisticated, we cannot ensure that it will always work on either your end or our end. If the connection with a video visit is poor, the visit may have to be switched to a telephone visit. With either a video or telephone visit, we are not always able to ensure that we have a secure connection.  By engaging in this virtual visit, you consent to the provision of healthcare and authorize for your insurance to be billed (if applicable) for the services provided during this visit. Depending on your insurance coverage, you may receive a charge related to this service.  I need to obtain your verbal consent now. Are you willing to proceed with your visit today? Francile Amey has provided verbal consent on 12/23/2023 for a virtual visit (video or telephone). Angelia Kelp, PA-C  Date: 12/23/2023 3:36 PM   Virtual Visit via Video Note   I, Angelia Kelp, connected with  Nhyla Nappi  ([id number], 10/10/1990) on 12/23/23 at  3:30 PM EDT by a video-enabled telemedicine application and verified that I am speaking with the correct person using two identifiers.  Location: Patient: Virtual Visit  Location Patient: Home Provider: Virtual Visit Location Provider: Home Office   I discussed the limitations of evaluation and management by telemedicine and the availability of in person appointments. The patient expressed understanding and agreed to proceed.    History of Present Illness: Gail Mathews is a 33 y.o. who identifies as a female who was assigned female at birth, and is being seen today for having increased allergy  congestion, sinus pain, frontal headache, and asthma. Has known allergy  issues. Usually takes Zyrtec -D, Singulair , and Albuterol  for these symptoms. Just recently ran out a few days ago. Symptoms worsening today. Called her pharmacy and she had no refills on file. Requesting refills.   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   Asthma 06/08/2011    Allergies:  Allergies  Allergen Reactions   Other Swelling    Pecans/walnuts Pecans/walnuts   Amoxicillin Rash   Medications:  Current Outpatient Medications:    albuterol  (PROVENTIL ) (2.5 MG/3ML) 0.083% nebulizer solution, Take 3 mLs (2.5 mg total) by nebulization every 4 (four) hours as needed for wheezing or shortness of breath., Disp: 75 mL, Rfl: 0   albuterol  (VENTOLIN  HFA) 108 (90 Base) MCG/ACT inhaler, Inhale 1-2 puffs into the lungs every 6 (six) hours as needed., Disp: 18 g, Rfl: 0   cetirizine -pseudoephedrine  (ZYRTEC -D) 5-120 MG tablet, Take 1 tablet by mouth 2 (two) times daily., Disp: 90 tablet, Rfl: 0   cyclobenzaprine  (FLEXERIL ) 10 MG tablet, Take 1 tablet (10 mg  total) by mouth 3 (three) times daily as needed for muscle spasms., Disp: 60 tablet, Rfl: 0   fluticasone  (FLONASE ) 50 MCG/ACT nasal spray, Place 2 sprays into both nostrils daily., Disp: 16 g, Rfl: 6   ibuprofen  (ADVIL ) 800 MG tablet, Take 1 tablet (800 mg total) by mouth every 8 (eight) hours as needed., Disp: 60 tablet, Rfl: 0   lidocaine  (LIDODERM ) 5 %, Place 1 patch  onto the skin daily. Remove & Discard patch within 12 hours or as directed by MD, Disp: 30 patch, Rfl: 0   montelukast  (SINGULAIR ) 10 MG tablet, Take 1 tablet (10 mg total) by mouth daily., Disp: 90 tablet, Rfl: 0   ondansetron  (ZOFRAN -ODT) 4 MG disintegrating tablet, Take 1 tablet (4 mg total) by mouth every 8 (eight) hours as needed., Disp: 20 tablet, Rfl: 0  Observations/Objective: Patient is well-developed, well-nourished in no acute distress.  Resting comfortably at home.  Head is normocephalic, atraumatic.  No labored breathing.  Speech is clear and coherent with logical content.  Patient is alert and oriented at baseline.    Assessment and Plan: 1. Seasonal allergic rhinitis due to pollen - cetirizine -pseudoephedrine  (ZYRTEC -D) 5-120 MG tablet; Take 1 tablet by mouth 2 (two) times daily.  Dispense: 90 tablet; Refill: 0 - montelukast  (SINGULAIR ) 10 MG tablet; Take 1 tablet (10 mg total) by mouth daily.  Dispense: 90 tablet; Refill: 0  2. Sinus pain - ibuprofen  (ADVIL ) 800 MG tablet; Take 1 tablet (800 mg total) by mouth every 8 (eight) hours as needed.  Dispense: 60 tablet; Refill: 0  3. Moderate persistent asthma with acute exacerbation - montelukast  (SINGULAIR ) 10 MG tablet; Take 1 tablet (10 mg total) by mouth daily.  Dispense: 90 tablet; Refill: 0 - albuterol  (VENTOLIN  HFA) 108 (90 Base) MCG/ACT inhaler; Inhale 1-2 puffs into the lungs every 6 (six) hours as needed.  Dispense: 18 g; Refill: 0  - Zyrtec -D refilled for allergies - Albuterol  and Singulair  refilled for asthma - Ibuprofen  added for sinus pain - Work note provided - Need to establish with a PCP discussed again, link in AVS to find a new PCP - Follow up in person for worsening symptoms  Follow Up Instructions: I discussed the assessment and treatment plan with the patient. The patient was provided an opportunity to ask questions and all were answered. The patient agreed with the plan and demonstrated an  understanding of the instructions.  A copy of instructions were sent to the patient via MyChart unless otherwise noted below.    The patient was advised to call back or seek an in-person evaluation if the symptoms worsen or if the condition fails to improve as anticipated.    Angelia Kelp, PA-C

## 2024-01-09 ENCOUNTER — Telehealth

## 2024-01-10 ENCOUNTER — Telehealth: Payer: Self-pay

## 2024-01-10 ENCOUNTER — Telehealth

## 2024-01-11 ENCOUNTER — Telehealth: Payer: Self-pay | Admitting: Physician Assistant

## 2024-01-11 ENCOUNTER — Telehealth

## 2024-01-11 DIAGNOSIS — R112 Nausea with vomiting, unspecified: Secondary | ICD-10-CM

## 2024-01-11 DIAGNOSIS — J4541 Moderate persistent asthma with (acute) exacerbation: Secondary | ICD-10-CM

## 2024-01-11 MED ORDER — ONDANSETRON 4 MG PO TBDP: 4.0000 mg | ORAL_TABLET | Freq: Three times a day (TID) | ORAL | 0 refills | Status: DC | PRN

## 2024-01-11 MED ORDER — BENZONATATE 100 MG PO CAPS: ORAL_CAPSULE | ORAL | 0 refills | Status: DC

## 2024-01-11 MED ORDER — METHYLPREDNISOLONE 4 MG PO TBPK: ORAL_TABLET | ORAL | 0 refills | Status: DC

## 2024-01-11 NOTE — Patient Instructions (Signed)
 Gail Mathews, thank you for joining Malcom Scriver, PA-C for today's virtual visit.  While this provider is not your primary care provider (PCP), if your PCP is located in our provider database this encounter information will be shared with them immediately following your visit.   A [facility] MyChart account gives you access to today's visit and all your visits, tests, and labs performed at [facility] " click here if you don't have a [facility] MyChart account or go to [email or web address]  Consent: (Patient) Gail Mathews provided verbal consent for this virtual visit at the beginning of the encounter.  Current Medications:  Current Outpatient Medications:    benzonatate  (TESSALON ) 100 MG capsule, Take 1-2 caps PO TID PRN, Disp: 20 capsule, Rfl: 0   methylPREDNISolone  (MEDROL  DOSEPAK) 4 MG TBPK tablet, Use per instructions on package, Disp: 21 tablet, Rfl: 0   albuterol  (PROVENTIL ) (2.5 MG/3ML) 0.083% nebulizer solution, Take 3 mLs (2.5 mg total) by nebulization every 4 (four) hours as needed for wheezing or shortness of breath., Disp: 75 mL, Rfl: 0   albuterol  (VENTOLIN  HFA) 108 (90 Base) MCG/ACT inhaler, Inhale 1-2 puffs into the lungs every 6 (six) hours as needed., Disp: 18 g, Rfl: 0   cetirizine -pseudoephedrine  (ZYRTEC -D) 5-120 MG tablet, Take 1 tablet by mouth 2 (two) times daily., Disp: 90 tablet, Rfl: 0   cyclobenzaprine  (FLEXERIL ) 10 MG tablet, Take 1 tablet (10 mg total) by mouth 3 (three) times daily as needed for muscle spasms., Disp: 60 tablet, Rfl: 0   fluticasone  (FLONASE ) 50 MCG/ACT nasal spray, Place 2 sprays into both nostrils daily., Disp: 16 g, Rfl: 6   ibuprofen  (ADVIL ) 800 MG tablet, Take 1 tablet (800 mg total) by mouth every 8 (eight) hours as needed., Disp: 60 tablet, Rfl: 0   lidocaine  (LIDODERM ) 5 %, Place 1 patch onto the skin daily. Remove & Discard patch within 12 hours or as directed by MD, Disp: 30 patch, Rfl: 0   montelukast  (SINGULAIR )  10 MG tablet, Take 1 tablet (10 mg total) by mouth daily., Disp: 90 tablet, Rfl: 0   ondansetron  (ZOFRAN -ODT) 4 MG disintegrating tablet, Take 1 tablet (4 mg total) by mouth every 8 (eight) hours as needed., Disp: 20 tablet, Rfl: 0   Medications ordered in this encounter:  Meds ordered this encounter  Medications   benzonatate  (TESSALON ) 100 MG capsule    Sig: Take 1-2 caps PO TID PRN    Dispense:  20 capsule    Refill:  0    Supervising Provider:   LAMPTEY, PHILIP O [[id number]]   methylPREDNISolone  (MEDROL  DOSEPAK) 4 MG TBPK tablet    Sig: Use per instructions on package    Dispense:  21 tablet    Refill:  0    Supervising Provider:   LAMPTEY, PHILIP O [[id number]]   ondansetron  (ZOFRAN -ODT) 4 MG disintegrating tablet    Sig: Take 1 tablet (4 mg total) by mouth every 8 (eight) hours as needed.    Dispense:  20 tablet    Refill:  0    Supervising Provider:   LAMPTEY, PHILIP O [[id number]]     *If you need refills on other medications prior to your next appointment, please contact your pharmacy*  Follow-Up: Call back or seek an in-person evaluation if the symptoms worsen or if the condition fails to improve as anticipated.  [facility] [phone]  Other Instructions Cough, Adult Coughing is a reflex that clears your throat and airways (respiratory system). It helps  heal and protect your lungs. It is normal to cough from time to time. A cough that happens with other symptoms or that lasts a long time may be a sign of a condition that needs treatment. A short-term (acute) cough may only last 2-3 weeks. A long-term (chronic) cough may last 8 or more weeks. Coughing is often caused by: Diseases, such as: An infection of the respiratory system. Asthma or other heart or lung diseases. Gastroesophageal reflux. This is when acid comes back up from the stomach. Breathing in things that irritate your lungs. Allergies. Postnasal drip. This is when mucus runs down the back of your  throat. Smoking. Some medicines. Follow these instructions at home: Medicines Take over-the-counter and prescription medicines only as told by your health care provider. Talk with your provider before you take cough medicine (cough suppressants). Eating and drinking Do not drink alcohol. Avoid caffeine. Drink enough fluid to keep your pee (urine) pale yellow. Lifestyle Avoid cigarette smoke. Do not use any products that contain nicotine or tobacco. These products include cigarettes, chewing tobacco, and vaping devices, such as e-cigarettes. If you need help quitting, ask your provider. Avoid things that make you cough. These may include perfumes, candles, cleaning products, or campfire smoke. General instructions  Watch for any changes to your cough. Tell your provider about them. Always cover your mouth when you cough. If the air is dry in your bedroom or home, use a cool mist vaporizer or humidifier. If your cough is worse at night, try to sleep in a semi-upright position. Rest as needed. Contact a health care provider if: You have new symptoms, or your symptoms get worse. You cough up pus. You have a fever that does not go away or a cough that does not get better after 2-3 weeks. You cannot control your cough with medicine, and you are losing sleep. You have pain that gets worse or is not helped with medicine. You lose weight for no clear reason. You have night sweats. Get help right away if: You cough up blood. You have trouble breathing. Your heart is beating very fast. These symptoms may be an emergency. Get help right away. Call 911. Do not wait to see if the symptoms will go away. Do not drive yourself to the hospital. This information is not intended to replace advice given to you by your health care provider. Make sure you discuss any questions you have with your health care provider. Document Revised: 04/19/2022 Document Reviewed: 04/19/2022 Elsevier Patient  Education  2024 [employer]vier [employer]   If you have been instructed to have an in-person evaluation today at a local Urgent Care facility, please use the link below. It will take you to a list of all of our available [facility], including address, phone number and hours of operation. Please do not delay care.  [facility]  If you or a family member do not have a primary care provider, use the link below to schedule a visit and establish care. When you choose a [facility] primary care physician or advanced practice provider, you gain a long-term partner in health. Find a Primary Care Provider  Learn more about [facility]'s in-office and virtual care options: [facility] - Get Care Now

## 2024-01-11 NOTE — Progress Notes (Signed)
 Virtual Visit Consent   Gail Mathews, you are scheduled for a virtual visit with a [facility] provider today. Just as with appointments in the office, your consent must be obtained to participate. Your consent will be active for this visit and any virtual visit you may have with one of our providers in the next 365 days. If you have a MyChart account, a copy of this consent can be sent to you electronically.  As this is a virtual visit, video technology does not allow for your provider to perform a traditional examination. This may limit your provider's ability to fully assess your condition. If your provider identifies any concerns that need to be evaluated in person or the need to arrange testing (such as labs, EKG, etc.), we will make arrangements to do so. Although advances in technology are sophisticated, we cannot ensure that it will always work on either your end or our end. If the connection with a video visit is poor, the visit may have to be switched to a telephone visit. With either a video or telephone visit, we are not always able to ensure that we have a secure connection.  By engaging in this virtual visit, you consent to the provision of healthcare and authorize for your insurance to be billed (if applicable) for the services provided during this visit. Depending on your insurance coverage, you may receive a charge related to this service.  I need to obtain your verbal consent now. Are you willing to proceed with your visit today? Gail Mathews has provided verbal consent on 01/11/2024 for a virtual visit (video or telephone). Gail Scriver, PA-C  Date: 01/11/2024 10:10 AM   Virtual Visit via Video Note   I, Gail Mathews, connected with  Gail Mathews  ([id number], 1991-02-01) on 01/11/24 at 10:00 AM EDT by a video-enabled telemedicine application and verified that I am speaking with the correct person using two identifiers.  Location: Patient: Virtual Visit Location Patient:  Home Provider: Virtual Visit Location Provider: Home Office   I discussed the limitations of evaluation and management by telemedicine and the availability of in person appointments. The patient expressed understanding and agreed to proceed.    History of Present Illness: Gail Mathews is a 33 y.o. who identifies as a female who was assigned female at birth,  with asthma who presents with cough and wheezing.  Symptoms began yesterday with sudden onset of coughing and wheezing. She has increased her use of an inhaler and nebulizer. Nausea and vomiting occur with coughing, affecting her ability to work. She uses Flonase  for nasal symptoms. Her cough produces white sputum. She had a fever yesterday that resolved with rest. No yellow sputum production.  She has previously used prednisone  for asthma exacerbations but is currently out of refills. She also ran out of zofran .  She was in contact with her aunt, who tested positive for COVID-19. An at-home COVID test was negative.   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   Asthma 06/08/2011    Allergies:  Allergies  Allergen Reactions   Other Swelling    Pecans/walnuts Pecans/walnuts   Amoxicillin Rash   Medications:  Current Outpatient Medications:    benzonatate  (TESSALON ) 100 MG capsule, Take 1-2 caps PO TID PRN, Disp: 20 capsule, Rfl: 0   methylPREDNISolone  (MEDROL  DOSEPAK) 4 MG TBPK tablet, Use per instructions on package, Disp: 21 tablet, Rfl: 0   albuterol  (PROVENTIL ) (2.5  MG/3ML) 0.083% nebulizer solution, Take 3 mLs (2.5 mg total) by nebulization every 4 (four) hours as needed for wheezing or shortness of breath., Disp: 75 mL, Rfl: 0   albuterol  (VENTOLIN  HFA) 108 (90 Base) MCG/ACT inhaler, Inhale 1-2 puffs into the lungs every 6 (six) hours as needed., Disp: 18 g, Rfl: 0   cetirizine -pseudoephedrine  (ZYRTEC -D) 5-120 MG tablet, Take 1 tablet by mouth 2 (two)  times daily., Disp: 90 tablet, Rfl: 0   cyclobenzaprine  (FLEXERIL ) 10 MG tablet, Take 1 tablet (10 mg total) by mouth 3 (three) times daily as needed for muscle spasms., Disp: 60 tablet, Rfl: 0   fluticasone  (FLONASE ) 50 MCG/ACT nasal spray, Place 2 sprays into both nostrils daily., Disp: 16 g, Rfl: 6   ibuprofen  (ADVIL ) 800 MG tablet, Take 1 tablet (800 mg total) by mouth every 8 (eight) hours as needed., Disp: 60 tablet, Rfl: 0   lidocaine  (LIDODERM ) 5 %, Place 1 patch onto the skin daily. Remove & Discard patch within 12 hours or as directed by MD, Disp: 30 patch, Rfl: 0   montelukast  (SINGULAIR ) 10 MG tablet, Take 1 tablet (10 mg total) by mouth daily., Disp: 90 tablet, Rfl: 0   ondansetron  (ZOFRAN -ODT) 4 MG disintegrating tablet, Take 1 tablet (4 mg total) by mouth every 8 (eight) hours as needed., Disp: 20 tablet, Rfl: 0  Observations/Objective: Patient is well-developed, well-nourished in no acute distress.  Resting comfortably  at home.  Head is normocephalic, atraumatic.  No labored breathing.  Speech is clear and coherent with logical content.  Patient is alert and oriented at baseline.    Assessment and Plan: 1. Moderate persistent asthma with acute exacerbation (Primary) - benzonatate  (TESSALON ) 100 MG capsule; Take 1-2 caps PO TID PRN  Dispense: 20 capsule; Refill: 0 - methylPREDNISolone  (MEDROL  DOSEPAK) 4 MG TBPK tablet; Use per instructions on package  Dispense: 21 tablet; Refill: 0  2. Nausea and vomiting, unspecified vomiting type - ondansetron  (ZOFRAN -ODT) 4 MG disintegrating tablet; Take 1 tablet (4 mg total) by mouth every 8 (eight) hours as needed.  Dispense: 20 tablet; Refill: 0    Asthma with acute exacerbation Acute exacerbation. Increased inhaler and nebulizer use. No fever currently. Negative COVID-19 test. Wheezing present.  - Prescribe steroid taper. - Send Tessalon  Perles for cough. - Send Zofran  for nausea.  Nausea and vomiting linked to coughing  episodes. No other gastrointestinal symptoms.  Follow Up Instructions: I discussed the assessment and treatment plan with the patient. The patient was provided an opportunity to ask questions and all were answered. The patient agreed with the plan and demonstrated an understanding of the instructions.  A copy of instructions were sent to the patient via MyChart unless otherwise noted below.     The patient was advised to call back or seek an in-person evaluation if the symptoms worsen or if the condition fails to improve as anticipated.    Etter Hermann Mayers, PA-C

## 2024-01-29 ENCOUNTER — Emergency Department (HOSPITAL_BASED_OUTPATIENT_CLINIC_OR_DEPARTMENT_OTHER)
Admission: EM | Admit: 2024-01-29 | Discharge: 2024-01-29 | Disposition: A | Discharge status: Home / Self Care | Payer: Self-pay | Attending: Emergency Medicine | Admitting: Emergency Medicine

## 2024-01-29 ENCOUNTER — Telehealth

## 2024-01-29 ENCOUNTER — Other Ambulatory Visit: Payer: Self-pay

## 2024-01-29 DIAGNOSIS — J069 Acute upper respiratory infection, unspecified: Secondary | ICD-10-CM | POA: Insufficient documentation

## 2024-01-29 DIAGNOSIS — U071 COVID-19: Secondary | ICD-10-CM | POA: Insufficient documentation

## 2024-01-29 LAB — RESP PANEL BY RT-PCR (RSV, FLU A&B, COVID)  RVPGX2
Influenza A by PCR: NEGATIVE
Influenza B by PCR: NEGATIVE
Resp Syncytial Virus by PCR: NEGATIVE
SARS Coronavirus 2 by RT PCR: POSITIVE — AB

## 2024-01-29 MED ORDER — IBUPROFEN 800 MG PO TABS
800.0000 mg | ORAL_TABLET | Freq: Once | ORAL | Status: AC
  Administered 2024-01-29: 800 mg via ORAL
  Filled 2024-01-29: qty 1

## 2024-01-29 NOTE — ED Provider Notes (Signed)
 [place] EMERGENCY DEPARTMENT AT [facility] Provider Note   CSN: [id number] Arrival date & time: 01/29/24  1210     History  Chief Complaint  Patient presents with   Nasal Congestion    Gail Mathews is a 33 y.o. female.  33 year old female with history of asthma presents with complaint of congestion, cough, fever. Patient was exposed to her aunt on Saturday who has COVID. Symptoms started Monday (3 days ago), fever onset today. Taking Mucinex  and nasal spray. Here today due to worsening congestion.        Home Medications Prior to Admission medications   Medication Sig Start Date End Date Taking? Authorizing Provider  albuterol  (PROVENTIL ) (2.5 MG/3ML) 0.083% nebulizer solution Take 3 mLs (2.5 mg total) by nebulization every 4 (four) hours as needed for wheezing or shortness of breath. 10/31/22   Rodriguez-Southworth, Lamond Pilot, PA-C  albuterol  (VENTOLIN  HFA) 108 (90 Base) MCG/ACT inhaler Inhale 1-2 puffs into the lungs every 6 (six) hours as needed. 12/23/23   Angelia Kelp, PA-C  benzonatate  (TESSALON ) 100 MG capsule Take 1-2 caps PO TID PRN 01/11/24   Mayers, Cari S, PA-C  cetirizine -pseudoephedrine  (ZYRTEC -D) 5-120 MG tablet Take 1 tablet by mouth 2 (two) times daily. 12/23/23   Angelia Kelp, PA-C  cyclobenzaprine  (FLEXERIL ) 10 MG tablet Take 1 tablet (10 mg total) by mouth 3 (three) times daily as needed for muscle spasms. 09/06/23   Fleming, Zelda W, NP  fluticasone  (FLONASE ) 50 MCG/ACT nasal spray Place 2 sprays into both nostrils daily. 03/13/23   Mardene Shake, FNP  ibuprofen  (ADVIL ) 800 MG tablet Take 1 tablet (800 mg total) by mouth every 8 (eight) hours as needed. 12/23/23   Angelia Kelp, PA-C  lidocaine  (LIDODERM ) 5 % Place 1 patch onto the skin daily. Remove & Discard patch within 12 hours or as directed by MD 07/12/23   Marciana Settle, PA-C  methylPREDNISolone  (MEDROL  DOSEPAK) 4 MG TBPK tablet Use per instructions on package 01/11/24   Mayers,  Cari S, PA-C  montelukast  (SINGULAIR ) 10 MG tablet Take 1 tablet (10 mg total) by mouth daily. 12/23/23   Angelia Kelp, PA-C  ondansetron  (ZOFRAN -ODT) 4 MG disintegrating tablet Take 1 tablet (4 mg total) by mouth every 8 (eight) hours as needed. 01/11/24   Mayers, Cari S, PA-C  cetirizine  (ZYRTEC ) 10 MG tablet Take 1 tablet (10 mg total) by mouth daily. 07/13/14 04/22/20  Donnell Gails, MD  ipratropium (ATROVENT ) 0.06 % nasal spray Place 2 sprays into both nostrils 4 (four) times daily. 05/03/18 04/22/20  Alfrieda Antes, PA-C      Allergies    Other and Amoxicillin    Review of Systems   Review of Systems Negative except as per HPI Physical Exam Updated Vital Signs BP 115/70 (BP Location: Right Arm)   Pulse (!) 101   Temp 99.4 F (37.4 C) (Oral)   Resp 18   SpO2 100%  Physical Exam Vitals and nursing note reviewed.  Constitutional:      General: She is not in acute distress.    Appearance: She is well-developed. She is obese. She is not diaphoretic.  HENT:     Head: Normocephalic and atraumatic.     Right Ear: Tympanic membrane and ear canal normal.     Left Ear: Tympanic membrane and ear canal normal.     Nose: Congestion present.     Mouth/Throat:     Mouth: Mucous membranes are moist.     Pharynx:  Posterior oropharyngeal erythema present. No oropharyngeal exudate.  Eyes:     Conjunctiva/sclera: Conjunctivae normal.  Cardiovascular:     Rate and Rhythm: Regular rhythm. Tachycardia present.     Pulses: Normal pulses.     Heart sounds: Normal heart sounds.  Pulmonary:     Effort: Pulmonary effort is normal.     Breath sounds: Normal breath sounds.  Musculoskeletal:     Cervical back: Neck supple.  Lymphadenopathy:     Cervical: No cervical adenopathy.  Neurological:     Mental Status: She is alert and oriented to person, place, and time.  Psychiatric:        Behavior: Behavior normal.     ED Results / Procedures / Treatments   Labs (all labs ordered are listed,  but only abnormal results are displayed) Labs Reviewed  RESP PANEL BY RT-PCR (RSV, FLU A&B, COVID)  [id number] - Abnormal; Notable for the following components:      Result Value   SARS Coronavirus 2 by RT PCR POSITIVE (*)    All other components within normal limits    EKG None  Radiology No results found.  Procedures Procedures    Medications Ordered in ED Medications  ibuprofen  (ADVIL ) tablet 800 mg (800 mg Oral Given 01/29/24 1237)    ED Course/ Medical Decision Making/ A&P                                 Medical Decision Making Risk Prescription drug management.   33 year old female with URI symptoms x 3 days after exposure to family member with COVID.  Patient is alert, nontoxic, in no distress with O2 sat of 100% on room air.  History of asthma, has an inhaler.  COVID-positive, negative for flu and RSV.  Recommend symptomatic management with Motrin , Tylenol , Mucinex .  Recheck with PCP as needed.        Final Clinical Impression(s) / ED Diagnoses Final diagnoses:  Viral URI with cough  COVID    Rx / DC Orders ED Discharge Orders     None         Darlis Eisenmenger, PA-C 01/29/24 1306    Scarlette Currier, MD 01/29/24 2259

## 2024-01-29 NOTE — Discharge Instructions (Addendum)
 Motrin  and Tylenol  as needed as directed for fever, body aches. Mucinex  for cough and congestion as needed as directed.

## 2024-01-29 NOTE — ED Triage Notes (Signed)
 C/o nasal congestion and coughing x 3 days. Denies CP.

## 2024-02-23 ENCOUNTER — Inpatient Hospital Stay (HOSPITAL_COMMUNITY): Payer: Self-pay

## 2024-02-23 ENCOUNTER — Inpatient Hospital Stay (HOSPITAL_COMMUNITY)
Admission: AD | Admit: 2024-02-23 | Discharge: 2024-02-23 | Disposition: A | Discharge status: Home / Self Care | Payer: Self-pay | Attending: Obstetrics and Gynecology | Admitting: Obstetrics and Gynecology

## 2024-02-23 ENCOUNTER — Encounter (HOSPITAL_COMMUNITY): Payer: Self-pay | Admitting: *Deleted

## 2024-02-23 DIAGNOSIS — M940 Chondrocostal junction syndrome [Tietze]: Secondary | ICD-10-CM

## 2024-02-23 DIAGNOSIS — R0789 Other chest pain: Secondary | ICD-10-CM

## 2024-02-23 DIAGNOSIS — R1012 Left upper quadrant pain: Secondary | ICD-10-CM | POA: Insufficient documentation

## 2024-02-23 DIAGNOSIS — Z3A08 8 weeks gestation of pregnancy: Secondary | ICD-10-CM

## 2024-02-23 DIAGNOSIS — M7918 Myalgia, other site: Secondary | ICD-10-CM | POA: Insufficient documentation

## 2024-02-23 DIAGNOSIS — R0781 Pleurodynia: Secondary | ICD-10-CM | POA: Insufficient documentation

## 2024-02-23 DIAGNOSIS — N854 Malposition of uterus: Secondary | ICD-10-CM | POA: Insufficient documentation

## 2024-02-23 DIAGNOSIS — O26891 Other specified pregnancy related conditions, first trimester: Secondary | ICD-10-CM

## 2024-02-23 LAB — CBC
HCT: 35.2 % — ABNORMAL LOW (ref 36.0–46.0)
Hemoglobin: 11.3 g/dL — ABNORMAL LOW (ref 12.0–15.0)
MCH: 25.4 pg — ABNORMAL LOW (ref 26.0–34.0)
MCHC: 32.1 g/dL (ref 30.0–36.0)
MCV: 79.1 fL — ABNORMAL LOW (ref 80.0–100.0)
Platelets: 368 10*3/uL (ref 150–400)
RBC: 4.45 MIL/uL (ref 3.87–5.11)
RDW: 14.9 % (ref 11.5–15.5)
WBC: 13 10*3/uL — ABNORMAL HIGH (ref 4.0–10.5)
nRBC: 0 % (ref 0.0–0.2)

## 2024-02-23 LAB — URINALYSIS, ROUTINE W REFLEX MICROSCOPIC
Bilirubin Urine: NEGATIVE
Glucose, UA: NEGATIVE mg/dL
Hgb urine dipstick: NEGATIVE
Ketones, ur: NEGATIVE mg/dL
Leukocytes,Ua: NEGATIVE
Nitrite: NEGATIVE
Protein, ur: NEGATIVE mg/dL
Specific Gravity, Urine: 1.028 (ref 1.005–1.030)
pH: 5 (ref 5.0–8.0)

## 2024-02-23 LAB — COMPREHENSIVE METABOLIC PANEL WITH GFR
ALT: 11 U/L (ref 0–44)
AST: 15 U/L (ref 15–41)
Albumin: 3.3 g/dL — ABNORMAL LOW (ref 3.5–5.0)
Alkaline Phosphatase: 53 U/L (ref 38–126)
Anion gap: 9 (ref 5–15)
BUN: 9 mg/dL (ref 6–20)
CO2: 19 mmol/L — ABNORMAL LOW (ref 22–32)
Calcium: 9.2 mg/dL (ref 8.9–10.3)
Chloride: 107 mmol/L (ref 98–111)
Creatinine, Ser: 0.95 mg/dL (ref 0.44–1.00)
GFR, Estimated: >60 mL/min (ref >60)
Glucose, Bld: 86 mg/dL (ref 70–99)
Potassium: 3.8 mmol/L (ref 3.5–5.1)
Sodium: 135 mmol/L (ref 135–145)
Total Bilirubin: 0.3 mg/dL (ref 0.0–1.2)
Total Protein: 6.8 g/dL (ref 6.5–8.1)

## 2024-02-23 LAB — WET PREP, GENITAL
Clue Cells Wet Prep HPF POC: NONE SEEN
Sperm: NONE SEEN
Trich, Wet Prep: NONE SEEN
WBC, Wet Prep HPF POC: >=10 — AB (ref <10)
Yeast Wet Prep HPF POC: NONE SEEN

## 2024-02-23 LAB — ABO/RH: ABO/RH(D): O POS

## 2024-02-23 LAB — HCG, QUANTITATIVE, PREGNANCY: hCG, Beta Chain, Quant, S: 114748 m[IU]/mL — ABNORMAL HIGH (ref <5)

## 2024-02-23 NOTE — MAU Provider Note (Signed)
 History     CSN: [id number]  Arrival date and time: 02/23/24 2135   Event Date/Time   First Provider Initiated Contact with Patient 02/23/24 2222      No chief complaint on file.  Gail Mathews is a 33 y.o. G1P0 at [redacted]w[redacted]d who receives care at [facility].  She presents today for LUQ pain.  Patient states pain only occurs with coughing and that she is not currently having pain.  She describes the pain as sharp that starts at left side near ribs and radiates down.  She denies any other pain and denies vaginal bleeding or discharge.  No issues with urination, constipation, or diarrhea. No recent sexual activity.   OB History     Gravida  1   Para      Term      Preterm      AB      Living         SAB      IAB      Ectopic      Multiple      Live Births              Past Medical History:  Diagnosis Date   Allergic rhinitis    Asthma     No past surgical history on file.  Family History  Problem Relation Age of Onset   Asthma Mother    Allergic rhinitis Mother    Diabetes Father     Social History   Tobacco Use   Smoking status: Never   Smokeless tobacco: Never  Substance Use Topics   Alcohol use: No   Drug use: No    Allergies:  Allergies  Allergen Reactions   Other Swelling    Pecans/walnuts Pecans/walnuts   Amoxicillin Rash    Medications Prior to Admission  Medication Sig Dispense Refill Last Dose/Taking   albuterol  (PROVENTIL ) (2.5 MG/3ML) 0.083% nebulizer solution Take 3 mLs (2.5 mg total) by nebulization every 4 (four) hours as needed for wheezing or shortness of breath. 75 mL 0    albuterol  (VENTOLIN  HFA) 108 (90 Base) MCG/ACT inhaler Inhale 1-2 puffs into the lungs every 6 (six) hours as needed. 18 g 0    benzonatate  (TESSALON ) 100 MG capsule Take 1-2 caps PO TID PRN 20 capsule 0    cetirizine -pseudoephedrine  (ZYRTEC -D) 5-120 MG tablet Take 1 tablet by mouth 2 (two) times daily. 90 tablet 0    cyclobenzaprine  (FLEXERIL ) 10 MG  tablet Take 1 tablet (10 mg total) by mouth 3 (three) times daily as needed for muscle spasms. 60 tablet 0    fluticasone  (FLONASE ) 50 MCG/ACT nasal spray Place 2 sprays into both nostrils daily. 16 g 6    ibuprofen  (ADVIL ) 800 MG tablet Take 1 tablet (800 mg total) by mouth every 8 (eight) hours as needed. 60 tablet 0    lidocaine  (LIDODERM ) 5 % Place 1 patch onto the skin daily. Remove & Discard patch within 12 hours or as directed by MD 30 patch 0    methylPREDNISolone  (MEDROL  DOSEPAK) 4 MG TBPK tablet Use per instructions on package 21 tablet 0    montelukast  (SINGULAIR ) 10 MG tablet Take 1 tablet (10 mg total) by mouth daily. 90 tablet 0    ondansetron  (ZOFRAN -ODT) 4 MG disintegrating tablet Take 1 tablet (4 mg total) by mouth every 8 (eight) hours as needed. 20 tablet 0     Review of Systems  Gastrointestinal:  Positive for abdominal pain (With cough). Negative for constipation,  diarrhea, nausea and vomiting.  Genitourinary:  Negative for difficulty urinating, dysuria, vaginal bleeding and vaginal discharge.  Neurological:  Negative for dizziness, light-headedness and headaches.   Physical Exam   Blood pressure 110/66, pulse (!) 115, temperature 98.2 F (36.8 C), temperature source Oral, resp. rate 14, height 5' 9 (1.753 m), weight 103.4 kg, last menstrual period 01/05/2024, SpO2 100%.  Physical Exam Vitals and nursing note reviewed. Exam conducted with a chaperone present Rae, RN).  Constitutional:      Appearance: Normal appearance.  HENT:     Head: Normocephalic and atraumatic.   Eyes:     Conjunctiva/sclera: Conjunctivae normal.    Cardiovascular:     Rate and Rhythm: Normal rate.  Pulmonary:     Effort: Pulmonary effort is normal. No respiratory distress.   Musculoskeletal:        General: Normal range of motion.     Cervical back: Normal range of motion.   Skin:    General: Skin is warm and dry.   Neurological:     Mental Status: She is alert and oriented to  person, place, and time.   Psychiatric:        Mood and Affect: Mood normal.        Behavior: Behavior normal.     MAU Course  Procedures Results for orders placed or performed during the hospital encounter of 02/23/24 (from the past 24 hours)  Wet prep, genital     Status: Abnormal   Collection Time: 02/23/24 10:19 PM   Specimen: PATH Cytology Cervicovaginal Ancillary Only  Result Value Ref Range   Yeast Wet Prep HPF POC NONE SEEN NONE SEEN   Trich, Wet Prep NONE SEEN NONE SEEN   Clue Cells Wet Prep HPF POC NONE SEEN NONE SEEN   WBC, Wet Prep HPF POC >=10 (A) <10   Sperm NONE SEEN   CBC     Status: Abnormal   Collection Time: 02/23/24 10:23 PM  Result Value Ref Range   WBC 13.0 (H) 4.0 - 10.5 K/uL   RBC 4.45 3.87 - 5.11 MIL/uL   Hemoglobin 11.3 (L) 12.0 - 15.0 g/dL   HCT 64.7 (L) 63.9 - 53.9 %   MCV 79.1 (L) 80.0 - 100.0 fL   MCH 25.4 (L) 26.0 - 34.0 pg   MCHC 32.1 30.0 - 36.0 g/dL   RDW 85.0 88.4 - 84.4 %   Platelets 368 150 - 400 K/uL   nRBC 0.0 0.0 - 0.2 %  ABO/Rh     Status: None   Collection Time: 02/23/24 10:23 PM  Result Value Ref Range   ABO/RH(D) O POS    No rh immune globuloin      NOT A RH IMMUNE GLOBULIN CANDIDATE, PT RH POSITIVE Performed at [facility], [address] [place], [place] [address]   hCG, quantitative, pregnancy     Status: Abnormal   Collection Time: 02/23/24 10:23 PM  Result Value Ref Range   hCG, Beta ChainEarleen, S 114,748 (H) <5 mIU/mL  Comprehensive metabolic panel with GFR     Status: Abnormal   Collection Time: 02/23/24 10:23 PM  Result Value Ref Range   Sodium 135 135 - 145 mmol/L   Potassium 3.8 3.5 - 5.1 mmol/L   Chloride 107 98 - 111 mmol/L   CO2 19 (L) 22 - 32 mmol/L   Glucose, Bld 86 70 - 99 mg/dL   BUN 9 6 - 20 mg/dL   Creatinine, Ser 9.04 0.44 -  1.00 mg/dL   Calcium 9.2 8.9 - 89.6 mg/dL   Total Protein 6.8 6.5 - 8.1 g/dL   Albumin 3.3 (L) 3.5 - 5.0 g/dL   AST 15 15 - 41 U/L   ALT 11 0 - 44 U/L   Alkaline  Phosphatase 53 38 - 126 U/L   Total Bilirubin 0.3 0.0 - 1.2 mg/dL   GFR, Estimated >39 >39 mL/min   Anion gap 9 5 - 15  Urinalysis, Routine w reflex microscopic -Urine, Clean Catch     Status: Abnormal   Collection Time: 02/23/24 10:23 PM  Result Value Ref Range   Color, Urine YELLOW YELLOW   APPearance HAZY (A) CLEAR   Specific Gravity, Urine 1.028 1.005 - 1.030   pH 5.0 5.0 - 8.0   Glucose, UA NEGATIVE NEGATIVE mg/dL   Hgb urine dipstick NEGATIVE NEGATIVE   Bilirubin Urine NEGATIVE NEGATIVE   Ketones, ur NEGATIVE NEGATIVE mg/dL   Protein, ur NEGATIVE NEGATIVE mg/dL   Nitrite NEGATIVE NEGATIVE   Leukocytes,Ua NEGATIVE NEGATIVE   US  OB LESS THAN 14 WEEKS WITH OB TRANSVAGINAL Result Date: 02/23/2024 CLINICAL DATA:  [id number] Abdominal pain during pregnancy [id number]. EXAM: OBSTETRIC <14 WK US  AND TRANSVAGINAL OB US  TECHNIQUE: Both transabdominal and transvaginal ultrasound examinations were performed for complete evaluation of the gestation as well as the maternal uterus, adnexal regions, and pelvic cul-de-sac. Transvaginal technique was performed to assess early pregnancy. COMPARISON:  None Available. FINDINGS: Intrauterine gestational sac: Single Yolk sac:  Visualized. Embryo:  Visualized. Cardiac Activity: Visualized. Heart Rate: 177 bpm CRL:  20 mm   8 w   4 d                  US  EDC: 09/30/2024 Subchorionic hemorrhage:  None visualized. Maternal uterus/adnexae: The uterus is retroverted. No adnexal masses are seen. The maternal ovaries are unremarkable save for a corpus luteum noted within the right ovary. No free fluid seen within the pelvis. IMPRESSION: Single living intrauterine gestation with an estimated gestational age of [redacted] weeks, 4 days. No acute abnormality. Electronically Signed   By: Dorethia Molt M.D.   On: 02/23/2024 22:48    MDM Physical Exam Cultures: Wet Prep and GC/CT Labs: UA, CBC, CMP, hCG, ABO Ultrasound Assessment and Plan  33 year old G1P0 at 7.1  weeks Musculoskeletal Pain  -Reviewed POC with patient. -Exam performed and findings discussed.  -Informed that findings are c/w musculoskeletal pain and reassured that normal occurrence with overuse of muscles with activities such as vigorous and/or frequent coughing.  -Discussed completing labs and US  to confirm IUP without current complications.  -Patient agreeable  -Labs ordered. -Send for US  and await results.   Harlene LITTIE Duncans 02/23/2024, 10:22 PM   Reassessment (11:39 PM) -Results as above. -Informed that EDD changed to 09/30/2024 as patient is 8.4 weeks by US .  -Reviewed pregnancy safe medications.  -Instructed to monitor symptoms. -Precautions reviewed. -Encouraged to call primary office or return to MAU if symptoms worsen or with the onset of new symptoms. -Discharged to home in stable condition.  Harlene LITTIE Duncans MSN, CNM Advanced Practice Provider, Center for [employer]

## 2024-02-23 NOTE — MAU Note (Signed)
 Pt says she went to [facility] on 6-17 - for get preg test- to confirm. Positive. Says sharp pain- started 9pm last night when she started coughing - the pain went from left side to lower abd . No pain now. Except when she coughs.  No VB. Last sex- 3 weeks ago

## 2024-02-23 NOTE — Discharge Instructions (Signed)
   [facility] [employer] [facility] [employer] at [facility]  [address], [place], [place] [address]  [phone]  Center for [facility] at [facility]  [address], [place], [place] [address]  [phone]  Center for [facility] Healthcare at [facility] [address], [place], [place] [address]  [phone]  Center for [facility] at [facility] [address], [place], [place] [address] [phone]  Center for [facility] at [facility]  [address] [place], [place] [address]  [phone]  Center for [facility] at [facility]  [address] (First floor), [place], [place] [address]  [phone]  Center for [facility] at [facility]  [address] [place], [place], [place] [address]  [phone]  [facility]  [address], [place], [place] [address]  [phone]  [facility]  [address] [place], [place], [place] [address]  [phone]  [facility]  [address] [place], [place] [address]  [phone]  [facility]  [address] [place], [place] [address]  [phone]  [facility]  [address], [place], [place] [address]  [phone]  [facility]   [address] [place], [place] [address]  [phone]  [facility]  [address] [place], [place] [address]   [phone]  [facility] [address], [place], [place] [address] [phone]  [facility]  [address] [place], [place] [address]  [phone]

## 2024-02-24 LAB — GC/CHLAMYDIA PROBE AMP (~~LOC~~) NOT AT ARMC
Chlamydia: NEGATIVE
Comment: NEGATIVE
Comment: NORMAL
Neisseria Gonorrhea: NEGATIVE

## 2024-02-26 ENCOUNTER — Telehealth: Payer: Self-pay | Admitting: Physician Assistant

## 2024-02-26 DIAGNOSIS — R55 Syncope and collapse: Secondary | ICD-10-CM

## 2024-02-26 DIAGNOSIS — O219 Vomiting of pregnancy, unspecified: Secondary | ICD-10-CM

## 2024-02-26 MED ORDER — DICLEGIS 10-10 MG PO TBEC: DELAYED_RELEASE_TABLET | ORAL | 0 refills | Status: DC

## 2024-02-26 NOTE — Patient Instructions (Addendum)
 Gail Mathews, thank you for joining Delon CHRISTELLA Dickinson, PA-C for today's virtual visit.  While this provider is not your primary care provider (PCP), if your PCP is located in our provider database this encounter information will be shared with them immediately following your visit.   A [facility] MyChart account gives you access to today's visit and all your visits, tests, and labs performed at [facility]  click here if you don't have a [facility] MyChart account or go to [email or web address]  Consent: (Patient) Gail Mathews provided verbal consent for this virtual visit at the beginning of the encounter.  Current Medications:  Current Outpatient Medications:    DICLEGIS 10-10 MG TBEC, Start with 1 tablet at bedtime, then increase to 2 tablets at bedtime on day 2 if nausea not controlled; Can increase to 1 tablet in the morning and 2 tablets in the evening if nausea not controlled, Disp: 60 tablet, Rfl: 0   albuterol  (PROVENTIL ) (2.5 MG/3ML) 0.083% nebulizer solution, Take 3 mLs (2.5 mg total) by nebulization every 4 (four) hours as needed for wheezing or shortness of breath., Disp: 75 mL, Rfl: 0   albuterol  (VENTOLIN  HFA) 108 (90 Base) MCG/ACT inhaler, Inhale 1-2 puffs into the lungs every 6 (six) hours as needed., Disp: 18 g, Rfl: 0   benzonatate  (TESSALON ) 100 MG capsule, Take 1-2 caps PO TID PRN, Disp: 20 capsule, Rfl: 0   cetirizine -pseudoephedrine  (ZYRTEC -D) 5-120 MG tablet, Take 1 tablet by mouth 2 (two) times daily., Disp: 90 tablet, Rfl: 0   cyclobenzaprine  (FLEXERIL ) 10 MG tablet, Take 1 tablet (10 mg total) by mouth 3 (three) times daily as needed for muscle spasms., Disp: 60 tablet, Rfl: 0   fluticasone  (FLONASE ) 50 MCG/ACT nasal spray, Place 2 sprays into both nostrils daily., Disp: 16 g, Rfl: 6   ibuprofen  (ADVIL ) 800 MG tablet, Take 1 tablet (800 mg total) by mouth every 8 (eight) hours as needed., Disp: 60 tablet, Rfl: 0   lidocaine  (LIDODERM ) 5 %,  Place 1 patch onto the skin daily. Remove & Discard patch within 12 hours or as directed by MD, Disp: 30 patch, Rfl: 0   methylPREDNISolone  (MEDROL  DOSEPAK) 4 MG TBPK tablet, Use per instructions on package, Disp: 21 tablet, Rfl: 0   montelukast  (SINGULAIR ) 10 MG tablet, Take 1 tablet (10 mg total) by mouth daily., Disp: 90 tablet, Rfl: 0   ondansetron  (ZOFRAN -ODT) 4 MG disintegrating tablet, Take 1 tablet (4 mg total) by mouth every 8 (eight) hours as needed., Disp: 20 tablet, Rfl: 0   Medications ordered in this encounter:  Meds ordered this encounter  Medications   DICLEGIS 10-10 MG TBEC    Sig: Start with 1 tablet at bedtime, then increase to 2 tablets at bedtime on day 2 if nausea not controlled; Can increase to 1 tablet in the morning and 2 tablets in the evening if nausea not controlled    Dispense:  60 tablet    Refill:  0    Supervising Provider:   BLAISE ALEENE KIDD [phone]     *If you need refills on other medications prior to your next appointment, please contact your pharmacy*  Follow-Up: Call back or seek an in-person evaluation if the symptoms worsen or if the condition fails to improve as anticipated.  [facility] [phone]  Other Instructions  Morning Sickness Morning sickness is when you throw up or feel like you may throw up during pregnancy. This condition often occurs in the morning, but it  can also occur at any time of day. Morning sickness is most common during the first three months of pregnancy, but it can go on throughout the pregnancy. Morning sickness is usually harmless. But if you throw up all the time, you should see your health care provider. You may also hear this condition called nausea and vomiting of pregnancy. What are the causes? The cause of morning sickness is not known. It may be linked to changes in hormones during pregnancy. What increases the risk? You're more likely to have morning sickness if: You had morning sickness in  another pregnancy. You're pregnant with more than one baby, such as twins. You had morning sickness in other pregnancies. You have had motion sickness before you were pregnant. You have had bad headaches or migraines before you were pregnant. What are the signs or symptoms? Symptoms of morning sickness include: Feeling like you may throw up. Throwing up. How is this diagnosed? Morning sickness is diagnosed based on your symptoms. How is this treated? Treatment is usually not needed for morning sickness. You may only need to change what you eat. In some cases, your provider may give you: Vitamin B6 supplements. Medicines to prevent throwing up. Ginger. Follow these instructions at home: Medicines Take your medicines only as told by your provider. Do not use any prescription, over-the-counter, or herbal medicines for morning sickness without first talking with your provider. Take prenatal vitamins. These can stop or lessen the symptoms of morning sickness. If you feel like you may throw up after taking prenatal vitamins, take them at night or with a snack. Eating and drinking     Eat dry toast or crackers before getting out of bed. Eat 5 or 6 small meals a day. Try ginger ale made with real ginger, ginger tea, or ginger candies. Drink fluids throughout the day. Eat protein foods when you need a snack. Nuts, yogurt, and cheese are good choices. Eat dry and bland foods like rice or baked potatoes. Foods that are high in carbohydrates are often helpful. Have someone cook for you if the smell of food makes you want to throw up. Foods to avoid Greasy foods. Fatty foods. Spicy foods. General instructions Try to avoid smells that make you feel sick. Use an air purifier to keep the air in your house free of smells. Try using an acupressure wristband. This is a wristband that's used to treat motion sickness. Try acupuncture. In this treatment, a provider puts thin needles into certain  areas of your body to make you feel better. Brush your teeth after throwing up or rinse with a mix of baking soda and water. The acid in throw-up can hurt your teeth. Contact a health care provider if: Your symptoms do not get better. You feel dizzy or light-headed. You're losing weight. Get help right away if: The feeling that you may throw up will not go away, or you can't stop throwing up. You faint. You have very bad pain in your belly. This information is not intended to replace advice given to you by your health care provider. Make sure you discuss any questions you have with your health care provider. Document Revised: 05/22/2023 Document Reviewed: 11/28/2022 Elsevier Patient Education  2024 Elsevier Inc.  Common Medications Safe in Pregnancy  Acne:      Constipation:  Benzoyl Peroxide     Colace  Clindamycin      Dulcolax Suppository  Topica Erythromycin     Fibercon  Salicylic Acid  Metamucil         Miralax AVOID:        Senakot   Accutane    Cough:  Retin-A       Cough Drops  Tetracycline      Phenergan w/ Codeine  if Rx  Minocycline      Robitussin (Plain & DM)  Antibiotics:     Crabs/Lice:  Ceclor       RID  Cephalosporins    AVOID:  E-Mycins      Kwell  Keflex  Macrobid /Macrodantin    Diarrhea:  Penicillin      Kao-Pectate  Zithromax       Imodium AD         PUSH FLUIDS AVOID:       Cipro     Fever:  Tetracycline      Tylenol  (Regular or Extra  Minocycline       Strength)  Levaquin      Extra Strength-Do not          Exceed 8 tabs/24 hrs Caffeine:        200mg /day (equiv. To 1 cup of coffee or  approx. 3 12 oz sodas)         Gas: Cold/Hayfever:       Gas-X  Benadryl      Mylicon  Claritin       Phazyme  **Claritin-D        Chlor-Trimeton    Headaches:  Dimetapp      ASA-Free Excedrin  Drixoral-Non-Drowsy     Cold Compress  Mucinex  (Guaifenasin)     Tylenol  (Regular or Extra  Sudafed/Sudafed-12 Hour     Strength)  **Sudafed PE  Pseudoephedrine    Tylenol  Cold & Sinus     Vicks Vapor Rub  Zyrtec   **AVOID if Problems With Blood Pressure         Heartburn: Avoid lying down for at least 1 hour after meals  Aciphex      Maalox     Rash:  Milk of Magnesia     Benadryl    Mylanta       1% Hydrocortisone Cream  Pepcid  Pepcid Complete   Sleep Aids:  Prevacid      Ambien   Prilosec       Benadryl  Rolaids       Chamomile Tea  Tums (Limit 4/day)     Unisom         Tylenol  PM         Warm milk-add vanilla or  Hemorrhoids:       Sugar for taste  Anusol/Anusol H.C.  (RX: Analapram 2.5%)  Sugar Substitutes:  Hydrocortisone OTC     Ok in moderation  Preparation H      Tucks        Vaseline lotion applied to tissue with wiping    Herpes:     Throat:  Acyclovir      Oragel  Famvir  Valtrex     Vaccines:         Flu Shot Leg Cramps:       *Gardasil  Benadryl      Hepatitis A         Hepatitis B Nasal Spray:       Pneumovax  Saline Nasal Spray     Polio Booster         Tetanus Nausea:       Tuberculosis test or PPD  Vitamin B6 25 mg TID   AVOID:  Dramamine      *Gardasil  Emetrol       Live Poliovirus  Ginger Root 250 mg QID    MMR (measles, mumps &  High Complex Carbs @ Bedtime    rebella)  Sea Bands-Accupressure    Varicella (Chickenpox)  Unisom 1/2 tab TID     *No known complications           If received before Pain:         Known pregnancy;   Darvocet       Resume series after  Lortab        Delivery  Percocet    Yeast:   Tramadol      Femstat  Tylenol  3      Gyne-lotrimin  Ultram       Monistat  Vicodin           MISC:         All Sunscreens           Hair Coloring/highlights          Insect Repellant's          (Including DEET)         Mystic Tans    If you have been instructed to have an in-person evaluation today at a local Urgent Care facility, please use the link below. It will take you to a list of all of our available [facility], including address, phone  number and hours of operation. Please do not delay care.  [facility]  If you or a family member do not have a primary care provider, use the link below to schedule a visit and establish care. When you choose a [facility] primary care physician or advanced practice provider, you gain a long-term partner in health. Find a Primary Care Provider  Learn more about [facility]'s in-office and virtual care options: [facility] - Get Care Now

## 2024-02-26 NOTE — Progress Notes (Signed)
 Virtual Visit Consent   Gail Mathews, you are scheduled for a virtual visit with a [facility] provider today. Just as with appointments in the office, your consent must be obtained to participate. Your consent will be active for this visit and any virtual visit you may have with one of our providers in the next 365 days. If you have a MyChart account, a copy of this consent can be sent to you electronically.  As this is a virtual visit, video technology does not allow for your provider to perform a traditional examination. This may limit your provider's ability to fully assess your condition. If your provider identifies any concerns that need to be evaluated in person or the need to arrange testing (such as labs, EKG, etc.), we will make arrangements to do so. Although advances in technology are sophisticated, we cannot ensure that it will always work on either your end or our end. If the connection with a video visit is poor, the visit may have to be switched to a telephone visit. With either a video or telephone visit, we are not always able to ensure that we have a secure connection.  By engaging in this virtual visit, you consent to the provision of healthcare and authorize for your insurance to be billed (if applicable) for the services provided during this visit. Depending on your insurance coverage, you may receive a charge related to this service.  I need to obtain your verbal consent now. Are you willing to proceed with your visit today? Gail Mathews has provided verbal consent on 02/26/2024 for a virtual visit (video or telephone). Delon CHRISTELLA Dickinson, PA-C  Date: 02/26/2024 3:36 PM   Virtual Visit via Video Note   I, Delon CHRISTELLA Dickinson, connected with  Gail Mathews  ([id number], 03/31/1991) on 02/26/24 at  2:45 PM EDT by a video-enabled telemedicine application and verified that I am speaking with the correct person using two identifiers.  Location: Patient: Virtual Visit  Location Patient: Home Provider: Virtual Visit Location Provider: Home Office   I discussed the limitations of evaluation and management by telemedicine and the availability of in person appointments. The patient expressed understanding and agreed to proceed.    History of Present Illness: Gail Mathews is a 33 y.o. who identifies as a female who was assigned female at birth, and is being seen today for nausea. Recently seen in-person on 02/23/24 and found to be about 8 weeks 4 days pregnant at that time. Since she has had increased nausea and vomiting. Reported to have decreased intake and vomiting enough that she even had a syncopal episode this morning while straining with vomiting. The episode lasted less than a few seconds and she did not fall or hit her head, was already on the ground from vomiting.    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   Asthma 06/08/2011    Allergies:  Allergies  Allergen Reactions   Other Swelling    Pecans/walnuts Pecans/walnuts   Amoxicillin Rash   Medications:  Current Outpatient Medications:    DICLEGIS 10-10 MG TBEC, Start with 1 tablet at bedtime, then increase to 2 tablets at bedtime on day 2 if nausea not controlled; Can increase to 1 tablet in the morning and 2 tablets in the evening if nausea not controlled, Disp: 60 tablet, Rfl: 0   albuterol  (PROVENTIL ) (2.5 MG/3ML) 0.083% nebulizer solution, Take 3 mLs (2.5 mg total) by nebulization every  4 (four) hours as needed for wheezing or shortness of breath., Disp: 75 mL, Rfl: 0   albuterol  (VENTOLIN  HFA) 108 (90 Base) MCG/ACT inhaler, Inhale 1-2 puffs into the lungs every 6 (six) hours as needed., Disp: 18 g, Rfl: 0   benzonatate  (TESSALON ) 100 MG capsule, Take 1-2 caps PO TID PRN, Disp: 20 capsule, Rfl: 0   cetirizine -pseudoephedrine  (ZYRTEC -D) 5-120 MG tablet, Take 1 tablet by mouth 2 (two) times daily., Disp: 90 tablet, Rfl:  0   cyclobenzaprine  (FLEXERIL ) 10 MG tablet, Take 1 tablet (10 mg total) by mouth 3 (three) times daily as needed for muscle spasms., Disp: 60 tablet, Rfl: 0   fluticasone  (FLONASE ) 50 MCG/ACT nasal spray, Place 2 sprays into both nostrils daily., Disp: 16 g, Rfl: 6   ibuprofen  (ADVIL ) 800 MG tablet, Take 1 tablet (800 mg total) by mouth every 8 (eight) hours as needed., Disp: 60 tablet, Rfl: 0   lidocaine  (LIDODERM ) 5 %, Place 1 patch onto the skin daily. Remove & Discard patch within 12 hours or as directed by MD, Disp: 30 patch, Rfl: 0   methylPREDNISolone  (MEDROL  DOSEPAK) 4 MG TBPK tablet, Use per instructions on package, Disp: 21 tablet, Rfl: 0   montelukast  (SINGULAIR ) 10 MG tablet, Take 1 tablet (10 mg total) by mouth daily., Disp: 90 tablet, Rfl: 0   ondansetron  (ZOFRAN -ODT) 4 MG disintegrating tablet, Take 1 tablet (4 mg total) by mouth every 8 (eight) hours as needed., Disp: 20 tablet, Rfl: 0  Observations/Objective: Patient is well-developed, well-nourished in no acute distress.  Resting comfortably at home.  Head is normocephalic, atraumatic.  No labored breathing.  Speech is clear and coherent with logical content.  Patient is alert and oriented at baseline.    Assessment and Plan: 1. Nausea and vomiting in pregnancy (Primary) - DICLEGIS 10-10 MG TBEC; Start with 1 tablet at bedtime, then increase to 2 tablets at bedtime on day 2 if nausea not controlled; Can increase to 1 tablet in the morning and 2 tablets in the evening if nausea not controlled  Dispense: 60 tablet; Refill: 0  2. Vasovagal syncope  - Diclegis prescribed for 1st trimester morning sickness - Push fluids, electrolyte beverages - Small sips (2 tablespoons) every 20 minutes while awake to rehydrate - Rest - Syncopal episode in setting of vomiting consistent with vasovagal syncope - Advised if recurs to seek immediate medical evaluation at the MAU or closest ER facility - Worsening symptoms should be evaluated  in person as well  Follow Up Instructions: I discussed the assessment and treatment plan with the patient. The patient was provided an opportunity to ask questions and all were answered. The patient agreed with the plan and demonstrated an understanding of the instructions.  A copy of instructions were sent to the patient via MyChart unless otherwise noted below.    The patient was advised to call back or seek an in-person evaluation if the symptoms worsen or if the condition fails to improve as anticipated.    Delon CHRISTELLA Dickinson, PA-C

## 2024-02-27 ENCOUNTER — Telehealth: Payer: Self-pay | Admitting: Physician Assistant

## 2024-02-27 DIAGNOSIS — J4521 Mild intermittent asthma with (acute) exacerbation: Secondary | ICD-10-CM

## 2024-02-27 MED ORDER — ALBUTEROL SULFATE (2.5 MG/3ML) 0.083% IN NEBU
2.5000 mg | INHALATION_SOLUTION | Freq: Four times a day (QID) | RESPIRATORY_TRACT | 1 refills | Status: DC | PRN
Start: 2024-02-27 — End: 2024-03-10

## 2024-02-27 NOTE — Progress Notes (Signed)
 Virtual Visit Consent   Darcella Shiffman, you are scheduled for a virtual visit with a [facility] provider today. Just as with appointments in the office, your consent must be obtained to participate. Your consent will be active for this visit and any virtual visit you may have with one of our providers in the next 365 days. If you have a MyChart account, a copy of this consent can be sent to you electronically.  As this is a virtual visit, video technology does not allow for your provider to perform a traditional examination. This may limit your provider's ability to fully assess your condition. If your provider identifies any concerns that need to be evaluated in person or the need to arrange testing (such as labs, EKG, etc.), we will make arrangements to do so. Although advances in technology are sophisticated, we cannot ensure that it will always work on either your end or our end. If the connection with a video visit is poor, the visit may have to be switched to a telephone visit. With either a video or telephone visit, we are not always able to ensure that we have a secure connection.  By engaging in this virtual visit, you consent to the provision of healthcare and authorize for your insurance to be billed (if applicable) for the services provided during this visit. Depending on your insurance coverage, you may receive a charge related to this service.  I need to obtain your verbal consent now. Are you willing to proceed with your visit today? Gail Mathews has provided verbal consent on 02/27/2024 for a virtual visit (video or telephone). Harlene PEDLAR Ward, PA-C  Date: 02/27/2024 6:55 PM   Virtual Visit via Video Note   I, Harlene PEDLAR Ward, connected with  Gail Mathews  ([id number], Jun 20, 1991) on 02/27/24 at  6:45 PM EDT by a video-enabled telemedicine application and verified that I am speaking with the correct person using two identifiers.  Location: Patient: Virtual Visit Location  Patient: Home Provider: Virtual Visit Location Provider: Home Office   I discussed the limitations of evaluation and management by telemedicine and the availability of in person appointments. The patient expressed understanding and agreed to proceed.    History of Present Illness: Gail Mathews is a 33 y.o. who identifies as a female who was assigned female at birth, and is being seen today for cough and wheezing that started three days ago.  Denies congestion, fever, chills. She has a h/o asthma, today's sx feel similar to previous asthma exacerbations. It has been a long time since she has had a flare up. She is currently [redacted] weeks pregnant.  She reports using her albuterol  inhaler which has been helpful. She does have a nebulizer machine, but needs a refill on liquid.    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   Asthma 06/08/2011    Allergies:  Allergies  Allergen Reactions   Other Swelling    Pecans/walnuts Pecans/walnuts   Amoxicillin Rash   Medications:  Current Outpatient Medications:    albuterol  (PROVENTIL ) (2.5 MG/3ML) 0.083% nebulizer solution, Take 3 mLs (2.5 mg total) by nebulization every 6 (six) hours as needed for wheezing or shortness of breath., Disp: 150 mL, Rfl: 1   albuterol  (VENTOLIN  HFA) 108 (90 Base) MCG/ACT inhaler, Inhale 1-2 puffs into the lungs every 6 (six) hours as needed., Disp: 18 g, Rfl: 0   benzonatate  (TESSALON ) 100 MG capsule,  Take 1-2 caps PO TID PRN, Disp: 20 capsule, Rfl: 0   cetirizine -pseudoephedrine  (ZYRTEC -D) 5-120 MG tablet, Take 1 tablet by mouth 2 (two) times daily., Disp: 90 tablet, Rfl: 0   cyclobenzaprine  (FLEXERIL ) 10 MG tablet, Take 1 tablet (10 mg total) by mouth 3 (three) times daily as needed for muscle spasms., Disp: 60 tablet, Rfl: 0   DICLEGIS  10-10 MG TBEC, Start with 1 tablet at bedtime, then increase to 2 tablets at bedtime on day 2 if nausea not  controlled; Can increase to 1 tablet in the morning and 2 tablets in the evening if nausea not controlled, Disp: 60 tablet, Rfl: 0   fluticasone  (FLONASE ) 50 MCG/ACT nasal spray, Place 2 sprays into both nostrils daily., Disp: 16 g, Rfl: 6   ibuprofen  (ADVIL ) 800 MG tablet, Take 1 tablet (800 mg total) by mouth every 8 (eight) hours as needed., Disp: 60 tablet, Rfl: 0   lidocaine  (LIDODERM ) 5 %, Place 1 patch onto the skin daily. Remove & Discard patch within 12 hours or as directed by MD, Disp: 30 patch, Rfl: 0   methylPREDNISolone  (MEDROL  DOSEPAK) 4 MG TBPK tablet, Use per instructions on package, Disp: 21 tablet, Rfl: 0   montelukast  (SINGULAIR ) 10 MG tablet, Take 1 tablet (10 mg total) by mouth daily., Disp: 90 tablet, Rfl: 0   ondansetron  (ZOFRAN -ODT) 4 MG disintegrating tablet, Take 1 tablet (4 mg total) by mouth every 8 (eight) hours as needed., Disp: 20 tablet, Rfl: 0  Observations/Objective: Patient is well-developed, well-nourished in no acute distress.  Resting comfortably at home.  Head is normocephalic, atraumatic.  No labored breathing.  Speech is clear and coherent with logical content.  Patient is alert and oriented at baseline.    Assessment and Plan: 1. Mild intermittent asthma with acute exacerbation (Primary)  Will refill nebulizer albuterol  solution.  Supportive care discussed.   Follow Up Instructions: I discussed the assessment and treatment plan with the patient. The patient was provided an opportunity to ask questions and all were answered. The patient agreed with the plan and demonstrated an understanding of the instructions.  A copy of instructions were sent to the patient via MyChart unless otherwise noted below.     The patient was advised to call back or seek an in-person evaluation if the symptoms worsen or if the condition fails to improve as anticipated.    Harlene PEDLAR Ward, PA-C

## 2024-02-27 NOTE — Patient Instructions (Addendum)
 Gail Mathews, thank you for joining Harlene PEDLAR Ward, PA-C for today's virtual visit.  While this provider is not your primary care provider (PCP), if your PCP is located in our provider database this encounter information will be shared with them immediately following your visit.   A [facility] MyChart account gives you access to today's visit and all your visits, tests, and labs performed at [facility]  click here if you don't have a [facility] MyChart account or go to [email or web address]  Consent: (Patient) Gail Mathews provided verbal consent for this virtual visit at the beginning of the encounter.  Current Medications:  Current Outpatient Medications:    albuterol  (PROVENTIL ) (2.5 MG/3ML) 0.083% nebulizer solution, Take 3 mLs (2.5 mg total) by nebulization every 6 (six) hours as needed for wheezing or shortness of breath., Disp: 150 mL, Rfl: 1   albuterol  (VENTOLIN  HFA) 108 (90 Base) MCG/ACT inhaler, Inhale 1-2 puffs into the lungs every 6 (six) hours as needed., Disp: 18 g, Rfl: 0   benzonatate  (TESSALON ) 100 MG capsule, Take 1-2 caps PO TID PRN, Disp: 20 capsule, Rfl: 0   cetirizine -pseudoephedrine  (ZYRTEC -D) 5-120 MG tablet, Take 1 tablet by mouth 2 (two) times daily., Disp: 90 tablet, Rfl: 0   cyclobenzaprine  (FLEXERIL ) 10 MG tablet, Take 1 tablet (10 mg total) by mouth 3 (three) times daily as needed for muscle spasms., Disp: 60 tablet, Rfl: 0   DICLEGIS  10-10 MG TBEC, Start with 1 tablet at bedtime, then increase to 2 tablets at bedtime on day 2 if nausea not controlled; Can increase to 1 tablet in the morning and 2 tablets in the evening if nausea not controlled, Disp: 60 tablet, Rfl: 0   fluticasone  (FLONASE ) 50 MCG/ACT nasal spray, Place 2 sprays into both nostrils daily., Disp: 16 g, Rfl: 6   ibuprofen  (ADVIL ) 800 MG tablet, Take 1 tablet (800 mg total) by mouth every 8 (eight) hours as needed., Disp: 60 tablet, Rfl: 0   lidocaine  (LIDODERM ) 5 %, Place 1  patch onto the skin daily. Remove & Discard patch within 12 hours or as directed by MD, Disp: 30 patch, Rfl: 0   methylPREDNISolone  (MEDROL  DOSEPAK) 4 MG TBPK tablet, Use per instructions on package, Disp: 21 tablet, Rfl: 0   montelukast  (SINGULAIR ) 10 MG tablet, Take 1 tablet (10 mg total) by mouth daily., Disp: 90 tablet, Rfl: 0   ondansetron  (ZOFRAN -ODT) 4 MG disintegrating tablet, Take 1 tablet (4 mg total) by mouth every 8 (eight) hours as needed., Disp: 20 tablet, Rfl: 0   Medications ordered in this encounter:  Meds ordered this encounter  Medications   albuterol  (PROVENTIL ) (2.5 MG/3ML) 0.083% nebulizer solution    Sig: Take 3 mLs (2.5 mg total) by nebulization every 6 (six) hours as needed for wheezing or shortness of breath.    Dispense:  150 mL    Refill:  1    Supervising Provider:   BLAISE ALEENE KIDD [[id number]]     *If you need refills on other medications prior to your next appointment, please contact your pharmacy*  Follow-Up: Call back or seek an in-person evaluation if the symptoms worsen or if the condition fails to improve as anticipated.   Virtual Care [phone]  Other Instructions Can use nebulizer as needed.    You can take Robitussin for cough and Mucinex  for congestion.    If no improvement please contact OB on call.    If you have been instructed to have an in-person evaluation today  at a local Urgent Care facility, please use the link below. It will take you to a list of all of our available [facility], including address, phone number and hours of operation. Please do not delay care.  [facility]  If you or a family member do not have a primary care provider, use the link below to schedule a visit and establish care. When you choose a [facility] primary care physician or advanced practice provider, you gain a long-term partner in health. Find a Primary Care Provider  Learn more about [facility] in-office and  virtual care options: [facility] - Get Care Now

## 2024-03-02 ENCOUNTER — Telehealth

## 2024-03-02 ENCOUNTER — Other Ambulatory Visit: Payer: Self-pay

## 2024-03-02 ENCOUNTER — Inpatient Hospital Stay (HOSPITAL_COMMUNITY)
Admission: AD | Admit: 2024-03-02 | Discharge: 2024-03-02 | Disposition: A | Discharge status: Home / Self Care | Payer: Self-pay | Attending: Obstetrics and Gynecology | Admitting: Obstetrics and Gynecology

## 2024-03-02 DIAGNOSIS — M549 Dorsalgia, unspecified: Secondary | ICD-10-CM

## 2024-03-02 DIAGNOSIS — M546 Pain in thoracic spine: Secondary | ICD-10-CM | POA: Diagnosis not present

## 2024-03-02 DIAGNOSIS — O26891 Other specified pregnancy related conditions, first trimester: Secondary | ICD-10-CM

## 2024-03-02 DIAGNOSIS — O26892 Other specified pregnancy related conditions, second trimester: Secondary | ICD-10-CM | POA: Diagnosis present

## 2024-03-02 DIAGNOSIS — Z5986 Financial insecurity: Secondary | ICD-10-CM | POA: Diagnosis not present

## 2024-03-02 DIAGNOSIS — Z5941 Food insecurity: Secondary | ICD-10-CM | POA: Diagnosis not present

## 2024-03-02 DIAGNOSIS — Z3A09 9 weeks gestation of pregnancy: Secondary | ICD-10-CM | POA: Diagnosis not present

## 2024-03-02 MED ORDER — CYCLOBENZAPRINE HCL 10 MG PO TABS: 10.0000 mg | ORAL_TABLET | Freq: Two times a day (BID) | ORAL | 0 refills | Status: DC | PRN

## 2024-03-02 NOTE — MAU Note (Signed)
 Gail Mathews is a 33 y.o. at [redacted]w[redacted]d here in MAU reporting: left back pain that radiates to left side but not to stomach. Wants to know if she has any weight lifting restrictions- works at a nursing home. No c/o VB, discharge, leaking of fluid.   LMP:  Onset of complaint: increased over time Pain score: 7/10 Vitals:   03/02/24 1414  BP: 118/73  Pulse: 99  Resp: 16  Temp: 98.7 F (37.1 C)  SpO2: 100%     FHT: na  Lab orders placed from triage: ua

## 2024-03-02 NOTE — Discharge Instructions (Signed)

## 2024-03-02 NOTE — MAU Provider Note (Cosign Needed Addendum)
 Chief Complaint: Back Pain   Event Date/Time   First Provider Initiated Contact with Patient 03/02/24 1501      SUBJECTIVE HPI: Gail Mathews is a 33 y.o. G1P0 at [redacted]w[redacted]d who presents to maternity admissions reporting left sided mid back pain. On 6/31/25 she hit her side on a wheelchair at work. Since then she has had intermittent spasms of pain radiating to the LLQ and suprapubic region. No vaginal bleeding or discharge. She would like to know what her work restrictions should be.   Past Medical History:  Diagnosis Date   Allergic rhinitis    Asthma    No past surgical history on file. Social History   Socioeconomic History   Marital status: Single    Spouse name: Not on file   Number of children: Not on file   Years of education: Not on file   Highest education level: Not on file  Occupational History   Not on file  Tobacco Use   Smoking status: Never   Smokeless tobacco: Never  Substance and Sexual Activity   Alcohol use: No   Drug use: No   Sexual activity: Yes    Birth control/protection: Injection  Other Topics Concern   Not on file  Social History Narrative   Not on file   Social Drivers of Health   Financial Resource Strain: High Risk (11/05/2021)   Received from [facility]   Overall Financial Resource Strain (CARDIA)    Difficulty of Paying Living Expenses: Hard  Food Insecurity: Food Insecurity Present (11/05/2021)   Received from [facility]   Hunger Vital Sign    Within the past 12 months, you worried that your food would run out before you got the money to buy more.: Often true    Within the past 12 months, the food you bought just didn't last and you didn't have money to get more.: Often true  Transportation Needs: No Transportation Needs (11/05/2021)   Received from [facility] - Transportation    Lack of Transportation (Medical): No    Lack of Transportation (Non-Medical): No  Physical Activity: Inactive (11/05/2021)   Received from  [facility]   Exercise Vital Sign    On average, how many days per week do you engage in moderate to strenuous exercise (like a brisk walk)?: 0 days    On average, how many minutes do you engage in exercise at this level?: 0 min  Stress: No Stress Concern Present (11/05/2021)   Received from [facility] - Occupational Stress Questionnaire    Feeling of Stress : Not at all  Social Connections: Unknown (01/02/2022)   Received from [facility]   Social Network    Social Network: Not on file  Recent Concern: Social Connections - Moderately Isolated (11/05/2021)   Received from [facility]   Social Connection and Isolation Panel    In a typical week, how many times do you talk on the phone with family, friends, or neighbors?: Twice a week    How often do you get together with friends or relatives?: Twice a week    How often do you attend church or religious services?: 1 to 4 times per year    Do you belong to any clubs or organizations such as church groups, unions, fraternal or athletic groups, or school groups?: No    How often do you attend meetings of the clubs or organizations you belong to?: Never  Are you married, widowed, divorced, separated, never married, or living with a partner?: Never married  Intimate Partner Violence: Unknown (12/03/2021)   Received from [facility]   HITS    Physically Hurt: Not on file    Insult or Talk Down To: Not on file    Threaten Physical Harm: Not on file    Scream or Curse: Not on file   No current facility-administered medications on file prior to encounter.   Current Outpatient Medications on File Prior to Encounter  Medication Sig Dispense Refill   albuterol  (VENTOLIN  HFA) 108 (90 Base) MCG/ACT inhaler Inhale 1-2 puffs into the lungs every 6 (six) hours as needed. 18 g 0   benzonatate  (TESSALON ) 100 MG capsule Take 1-2 caps PO TID PRN 20 capsule 0   cetirizine -pseudoephedrine  (ZYRTEC -D) 5-120 MG  tablet Take 1 tablet by mouth 2 (two) times daily. 90 tablet 0   cyclobenzaprine  (FLEXERIL ) 10 MG tablet Take 1 tablet (10 mg total) by mouth 3 (three) times daily as needed for muscle spasms. 60 tablet 0   fluticasone  (FLONASE ) 50 MCG/ACT nasal spray Place 2 sprays into both nostrils daily. 16 g 6   ibuprofen  (ADVIL ) 800 MG tablet Take 1 tablet (800 mg total) by mouth every 8 (eight) hours as needed. 60 tablet 0   lidocaine  (LIDODERM ) 5 % Place 1 patch onto the skin daily. Remove & Discard patch within 12 hours or as directed by MD 30 patch 0   methylPREDNISolone  (MEDROL  DOSEPAK) 4 MG TBPK tablet Use per instructions on package 21 tablet 0   montelukast  (SINGULAIR ) 10 MG tablet Take 1 tablet (10 mg total) by mouth daily. 90 tablet 0   ondansetron  (ZOFRAN -ODT) 4 MG disintegrating tablet Take 1 tablet (4 mg total) by mouth every 8 (eight) hours as needed. 20 tablet 0   [DISCONTINUED] cetirizine  (ZYRTEC ) 10 MG tablet Take 1 tablet (10 mg total) by mouth daily. 30 tablet 3   [DISCONTINUED] ipratropium (ATROVENT ) 0.06 % nasal spray Place 2 sprays into both nostrils 4 (four) times daily. 15 mL 0   Allergies  Allergen Reactions   Other Swelling    Pecans/walnuts Pecans/walnuts   Amoxicillin Rash    ROS:  Pertinent positives/negatives listed above.  I have reviewed patient's Past Medical Hx, Surgical Hx, Family Hx, Social Hx, medications and allergies.   Physical Exam  Patient Vitals for the past 24 hrs:  BP Temp Temp src Pulse Resp SpO2  03/02/24 1414 118/73 98.7 F (37.1 C) Oral 99 16 100 %   Constitutional: Well-developed, well-nourished female in no acute distress.  Cardiovascular: normal rate Respiratory: normal effort Back: no paraspinal tenderness, mild increase in tone in the left lumbar region, mild ttp in the left lumbar region Neurologic: Alert and oriented x 4.     Pt informed that the ultrasound is considered a limited OB ultrasound and is not intended to be a complete  ultrasound exam.  Patient also informed that the ultrasound is not being completed with the intent of assessing for fetal or placental anomalies or any pelvic abnormalities.  Explained that the purpose of today's ultrasound is to assess for  Fetal Heart Rate due to inability for RN to get via doppler .  Patient acknowledges the purpose of the exam and the limitations of the study.    FHR 169 BPM per ultrasound doppler   LAB RESULTS No results found for this or any previous visit (from the past 24 hours).  --/--/O POS (06/23 2223)  IMAGING  US  OB LESS THAN 14 WEEKS WITH OB TRANSVAGINAL Result Date: 02/23/2024 CLINICAL DATA:  [id number] Abdominal pain during pregnancy [id number]. EXAM: OBSTETRIC <14 WK US  AND TRANSVAGINAL OB US  TECHNIQUE: Both transabdominal and transvaginal ultrasound examinations were performed for complete evaluation of the gestation as well as the maternal uterus, adnexal regions, and pelvic cul-de-sac. Transvaginal technique was performed to assess early pregnancy. COMPARISON:  None Available. FINDINGS: Intrauterine gestational sac: Single Yolk sac:  Visualized. Embryo:  Visualized. Cardiac Activity: Visualized. Heart Rate: 177 bpm CRL:  20 mm   8 w   4 d                  US  EDC: 09/30/2024 Subchorionic hemorrhage:  None visualized. Maternal uterus/adnexae: The uterus is retroverted. No adnexal masses are seen. The maternal ovaries are unremarkable save for a corpus luteum noted within the right ovary. No free fluid seen within the pelvis. IMPRESSION: Single living intrauterine gestation with an estimated gestational age of [redacted] weeks, 4 days. No acute abnormality. Electronically Signed   By: Dorethia Molt M.D.   On: 02/23/2024 22:48    MAU Management/MDM:  MODERATE   Orders Placed This Encounter  Procedures   Urinalysis, Routine w reflex microscopic -Urine, Clean Catch   Discharge patient Discharge disposition: 01-Home or Self Care; Discharge patient date: 03/02/2024    Meds ordered  this encounter  Medications   cyclobenzaprine  (FLEXERIL ) 10 MG tablet    Sig: Take 1 tablet (10 mg total) by mouth 2 (two) times daily as needed for muscle spasms.    Dispense:  20 tablet    Refill:  0    Supervising Provider:   PRATT, TANYA S [[id number]]     ASSESSMENT 1. Back pain, unspecified back location, unspecified back pain laterality, unspecified chronicity   2. [redacted] weeks gestation of pregnancy     PLAN  Mild lumbar trauma, back pain -fetal doppler is reassuring -offered tylenol  1000 mg and Flexeril  10 mg one time during visit, but patient declines -RX for flexeril  10 mg TID #20 sent -Recommended OTC Tylenol  500 mg PRN for pain relief, max 4000 mg in 24 hours -patient provided with work note to include pregnancy work restrictions -has New OB and TVUS visit with [employer] on 03/10/24  Allergies as of 03/02/2024       Reactions   Other Swelling   Pecans/walnuts Pecans/walnuts   Amoxicillin Rash        Medication List     TAKE these medications    albuterol  108 (90 Base) MCG/ACT inhaler Commonly known as: VENTOLIN  HFA Inhale 1-2 puffs into the lungs every 6 (six) hours as needed.   albuterol  (2.5 MG/3ML) 0.083% nebulizer solution Commonly known as: PROVENTIL  Take 3 mLs (2.5 mg total) by nebulization every 6 (six) hours as needed for wheezing or shortness of breath.   benzonatate  100 MG capsule Commonly known as: TESSALON  Take 1-2 caps PO TID PRN   cetirizine -pseudoephedrine  5-120 MG tablet Commonly known as: ZYRTEC -D Take 1 tablet by mouth 2 (two) times daily.   cyclobenzaprine  10 MG tablet Commonly known as: FLEXERIL  Take 1 tablet (10 mg total) by mouth 2 (two) times daily as needed for muscle spasms. What changed: when to take this   Diclegis  10-10 MG Tbec Generic drug: Doxylamine-Pyridoxine Start with 1 tablet at bedtime, then increase to 2 tablets at bedtime on day 2 if nausea not controlled; Can increase to 1 tablet in the morning and 2 tablets in the  evening if  nausea not controlled   fluticasone  50 MCG/ACT nasal spray Commonly known as: FLONASE  Place 2 sprays into both nostrils daily.   ibuprofen  800 MG tablet Commonly known as: ADVIL  Take 1 tablet (800 mg total) by mouth every 8 (eight) hours as needed.   lidocaine  5 % Commonly known as: Lidoderm  Place 1 patch onto the skin daily. Remove & Discard patch within 12 hours or as directed by MD   methylPREDNISolone  4 MG Tbpk tablet Commonly known as: MEDROL  DOSEPAK Use per instructions on package   montelukast  10 MG tablet Commonly known as: SINGULAIR  Take 1 tablet (10 mg total) by mouth daily.   ondansetron  4 MG disintegrating tablet Commonly known as: ZOFRAN -ODT Take 1 tablet (4 mg total) by mouth every 8 (eight) hours as needed.         Reagan Alena Morrison, MD Family Medicine PGY1 03/02/2024  3:29 PM   Attestation of Supervision of Student:  I confirm that I have verified the information documented in the resident physicians  note and that I have also personally reperformed the history, physical exam and all medical decision making activities.  I have verified that all services and findings are accurately documented in this student's note; and I agree with management and plan as outlined in the documentation. I have also made any necessary editorial changes.  I personally performed the bedside ultrasound and interpreted the images and documented my findings.  Olam DELENA Dalton, NP [facility] [employer], [facility] 03/02/2024 3:45 PM

## 2024-03-08 ENCOUNTER — Encounter (HOSPITAL_BASED_OUTPATIENT_CLINIC_OR_DEPARTMENT_OTHER): Payer: Self-pay

## 2024-03-08 ENCOUNTER — Other Ambulatory Visit: Payer: Self-pay

## 2024-03-08 DIAGNOSIS — O26891 Other specified pregnancy related conditions, first trimester: Secondary | ICD-10-CM | POA: Insufficient documentation

## 2024-03-08 DIAGNOSIS — R102 Pelvic and perineal pain: Secondary | ICD-10-CM | POA: Insufficient documentation

## 2024-03-08 DIAGNOSIS — Z3A10 10 weeks gestation of pregnancy: Secondary | ICD-10-CM | POA: Diagnosis not present

## 2024-03-08 LAB — COMPREHENSIVE METABOLIC PANEL WITH GFR
ALT: 8 U/L (ref 0–44)
AST: 16 U/L (ref 15–41)
Albumin: 4 g/dL (ref 3.5–5.0)
Alkaline Phosphatase: 66 U/L (ref 38–126)
Anion gap: 12 (ref 5–15)
BUN: 5 mg/dL — ABNORMAL LOW (ref 6–20)
CO2: 20 mmol/L — ABNORMAL LOW (ref 22–32)
Calcium: 9.3 mg/dL (ref 8.9–10.3)
Chloride: 101 mmol/L (ref 98–111)
Creatinine, Ser: 0.73 mg/dL (ref 0.44–1.00)
GFR, Estimated: >60 mL/min (ref >60)
Glucose, Bld: 86 mg/dL (ref 70–99)
Potassium: 3.9 mmol/L (ref 3.5–5.1)
Sodium: 133 mmol/L — ABNORMAL LOW (ref 135–145)
Total Bilirubin: 0.3 mg/dL (ref 0.0–1.2)
Total Protein: 7.3 g/dL (ref 6.5–8.1)

## 2024-03-08 LAB — URINALYSIS, ROUTINE W REFLEX MICROSCOPIC
Bilirubin Urine: NEGATIVE
Glucose, UA: NEGATIVE mg/dL
Hgb urine dipstick: NEGATIVE
Ketones, ur: 40 mg/dL — AB
Nitrite: NEGATIVE
Specific Gravity, Urine: 1.021 (ref 1.005–1.030)
pH: 6 (ref 5.0–8.0)

## 2024-03-08 LAB — CBC WITH DIFFERENTIAL/PLATELET
Abs Immature Granulocytes: 0.05 K/uL (ref 0.00–0.07)
Basophils Absolute: 0 K/uL (ref 0.0–0.1)
Basophils Relative: 0 %
Eosinophils Absolute: 0.4 K/uL (ref 0.0–0.5)
Eosinophils Relative: 4 %
HCT: 36.8 % (ref 36.0–46.0)
Hemoglobin: 11.8 g/dL — ABNORMAL LOW (ref 12.0–15.0)
Immature Granulocytes: 0 %
Lymphocytes Relative: 23 %
Lymphs Abs: 2.6 K/uL (ref 0.7–4.0)
MCH: 25.2 pg — ABNORMAL LOW (ref 26.0–34.0)
MCHC: 32.1 g/dL (ref 30.0–36.0)
MCV: 78.6 fL — ABNORMAL LOW (ref 80.0–100.0)
Monocytes Absolute: 0.9 K/uL (ref 0.1–1.0)
Monocytes Relative: 8 %
Neutro Abs: 7.4 K/uL (ref 1.7–7.7)
Neutrophils Relative %: 65 %
Platelets: 358 K/uL (ref 150–400)
RBC: 4.68 MIL/uL (ref 3.87–5.11)
RDW: 14.8 % (ref 11.5–15.5)
WBC: 11.3 K/uL — ABNORMAL HIGH (ref 4.0–10.5)
nRBC: 0 % (ref 0.0–0.2)

## 2024-03-08 LAB — HCG, QUANTITATIVE, PREGNANCY: hCG, Beta Chain, Quant, S: 67902 m[IU]/mL — ABNORMAL HIGH (ref <5)

## 2024-03-08 NOTE — ED Triage Notes (Signed)
 Pt presents via POV c/o pelvic pain starting today. Denies urinary symptoms. Reports [redacted]w[redacted]d OB.

## 2024-03-08 NOTE — ED Notes (Signed)
 FHT 139 suprapubic.

## 2024-03-09 ENCOUNTER — Emergency Department (HOSPITAL_BASED_OUTPATIENT_CLINIC_OR_DEPARTMENT_OTHER)
Admission: EM | Admit: 2024-03-09 | Discharge: 2024-03-09 | Disposition: A | Discharge status: Home / Self Care | Payer: Self-pay | Attending: Emergency Medicine | Admitting: Emergency Medicine

## 2024-03-09 DIAGNOSIS — O26891 Other specified pregnancy related conditions, first trimester: Secondary | ICD-10-CM

## 2024-03-09 NOTE — ED Provider Notes (Signed)
 [place] EMERGENCY DEPARTMENT AT [facility]  Provider Note  CSN: [id number] Arrival date & time: 03/08/24 2234  History Chief Complaint  Patient presents with   Pelvic Pain    Gail Mathews is a 33 y.o. female who is G1P0 at approx 10wks reports a brief, sharp pain in her lower abdomen/pelvic area earlier today, has since resolved. No other symptoms. No fever, cough, N/V/D, dysuria or vaginal bleeding discharge. She is scheduled to establish with Ob on 7/9, has had prior US  demonstrating live IUP.    Home Medications Prior to Admission medications   Medication Sig Start Date End Date Taking? Authorizing Provider  albuterol  (PROVENTIL ) (2.5 MG/3ML) 0.083% nebulizer solution Take 3 mLs (2.5 mg total) by nebulization every 6 (six) hours as needed for wheezing or shortness of breath. 02/27/24   Ward, Harlene PEDLAR, PA-C  albuterol  (VENTOLIN  HFA) 108 (90 Base) MCG/ACT inhaler Inhale 1-2 puffs into the lungs every 6 (six) hours as needed. 12/23/23   Vivienne Delon HERO, PA-C  benzonatate  (TESSALON ) 100 MG capsule Take 1-2 caps PO TID PRN 01/11/24   Mayers, Cari S, PA-C  cetirizine -pseudoephedrine  (ZYRTEC -D) 5-120 MG tablet Take 1 tablet by mouth 2 (two) times daily. 12/23/23   Vivienne Delon HERO, PA-C  cyclobenzaprine  (FLEXERIL ) 10 MG tablet Take 1 tablet (10 mg total) by mouth 2 (two) times daily as needed for muscle spasms. 03/02/24   Cooleen, Olam LABOR, NP  DICLEGIS  10-10 MG TBEC Start with 1 tablet at bedtime, then increase to 2 tablets at bedtime on day 2 if nausea not controlled; Can increase to 1 tablet in the morning and 2 tablets in the evening if nausea not controlled 02/26/24   Vivienne Delon HERO, PA-C  fluticasone  (FLONASE ) 50 MCG/ACT nasal spray Place 2 sprays into both nostrils daily. 03/13/23   Kennyth Domino, FNP  ibuprofen  (ADVIL ) 800 MG tablet Take 1 tablet (800 mg total) by mouth every 8 (eight) hours as needed. 12/23/23   Vivienne Delon HERO, PA-C  lidocaine  (LIDODERM ) 5 %  Place 1 patch onto the skin daily. Remove & Discard patch within 12 hours or as directed by MD 07/12/23   Rolan Berthold, PA-C  methylPREDNISolone  (MEDROL  DOSEPAK) 4 MG TBPK tablet Use per instructions on package 01/11/24   Mayers, Cari S, PA-C  montelukast  (SINGULAIR ) 10 MG tablet Take 1 tablet (10 mg total) by mouth daily. 12/23/23   Vivienne Delon HERO, PA-C  ondansetron  (ZOFRAN -ODT) 4 MG disintegrating tablet Take 1 tablet (4 mg total) by mouth every 8 (eight) hours as needed. 01/11/24   Mayers, Cari S, PA-C  cetirizine  (ZYRTEC ) 10 MG tablet Take 1 tablet (10 mg total) by mouth daily. 07/13/14 04/22/20  Diedra Rocky PARAS, MD  ipratropium (ATROVENT ) 0.06 % nasal spray Place 2 sprays into both nostrils 4 (four) times daily. 05/03/18 04/22/20  Babara Greig GAILS, PA-C     Allergies    Other and Amoxicillin   Review of Systems   Review of Systems Please see HPI for pertinent positives and negatives  Physical Exam BP 112/87 (BP Location: Right Arm)   Pulse (!) 123   Temp 98.1 F (36.7 C)   Resp 18   Ht 5' 9 (1.753 m)   Wt 103 kg   LMP 01/05/2024   SpO2 94%   BMI 33.53 kg/m   Physical Exam Vitals and nursing note reviewed.  Constitutional:      Appearance: Normal appearance.  HENT:     Head: Normocephalic and atraumatic.  Nose: Nose normal.     Mouth/Throat:     Mouth: Mucous membranes are moist.  Eyes:     Extraocular Movements: Extraocular movements intact.     Conjunctiva/sclera: Conjunctivae normal.  Cardiovascular:     Rate and Rhythm: Normal rate.  Pulmonary:     Effort: Pulmonary effort is normal.     Breath sounds: Normal breath sounds.  Abdominal:     General: Abdomen is flat.     Palpations: Abdomen is soft.     Tenderness: There is no abdominal tenderness. There is no guarding.  Musculoskeletal:        General: No swelling. Normal range of motion.     Cervical back: Neck supple.  Skin:    General: Skin is warm and dry.  Neurological:     General: No focal deficit  present.     Mental Status: She is alert.  Psychiatric:        Mood and Affect: Mood normal.     ED Results / Procedures / Treatments   EKG None  Procedures Procedures  Medications Ordered in the ED Medications - No data to display  Initial Impression and Plan  Patient here with a single episode of abdominal cramping in first trimester. She is asymptomatic now. She had labs in triage showing unremarkable CBC, CMP, UA. HCG downtrending but likely has peaked. No indication for emergent imaging at this time. FHT are reassuring. Recommend she continue with planned outpatient follow up. RTED, preferably MAU, for any other pregnancy related concerns.   ED Course       MDM Rules/Calculators/A&P Medical Decision Making Problems Addressed: Pelvic pain affecting pregnancy in first trimester, antepartum: acute illness or injury  Amount and/or Complexity of Data Reviewed Labs: ordered. Decision-making details documented in ED Course.     Final Clinical Impression(s) / ED Diagnoses Final diagnoses:  Pelvic pain affecting pregnancy in first trimester, antepartum    Rx / DC Orders ED Discharge Orders     None        Roselyn Carlin NOVAK, MD 03/09/24 [phone]

## 2024-03-10 ENCOUNTER — Encounter: Payer: Self-pay | Admitting: Physician Assistant

## 2024-03-10 ENCOUNTER — Telehealth: Admitting: Family Medicine

## 2024-03-10 DIAGNOSIS — J302 Other seasonal allergic rhinitis: Secondary | ICD-10-CM

## 2024-03-10 DIAGNOSIS — J4541 Moderate persistent asthma with (acute) exacerbation: Secondary | ICD-10-CM

## 2024-03-10 MED ORDER — CETIRIZINE HCL 10 MG PO TABS: 10.0000 mg | ORAL_TABLET | Freq: Every day | ORAL | 0 refills | Status: DC

## 2024-03-10 MED ORDER — ALBUTEROL SULFATE HFA 108 (90 BASE) MCG/ACT IN AERS: 1.0000 | INHALATION_SPRAY | RESPIRATORY_TRACT | 0 refills | Status: DC | PRN

## 2024-03-10 MED ORDER — ALBUTEROL SULFATE (2.5 MG/3ML) 0.083% IN NEBU: 2.5000 mg | INHALATION_SOLUTION | RESPIRATORY_TRACT | 1 refills | Status: DC | PRN

## 2024-03-10 NOTE — Telephone Encounter (Signed)
Patient has appt scheduled today.

## 2024-03-10 NOTE — Patient Instructions (Addendum)
 Gail Mathews, thank you for joining [name], FNP for today's virtual visit.  While this provider is not your primary care provider (PCP), if your PCP is located in our provider database this encounter information will be shared with them immediately following your visit.   A [place] MyChart account gives you access to today's visit and all your visits, tests, and labs performed at [facility]  click here if you don't have a [facility] MyChart account or go to [email or web address]  Consent: (Patient) Gail Mathews provided verbal consent for this virtual visit at the beginning of the encounter.  Current Medications:  Current Outpatient Medications:    cetirizine  (ZYRTEC ) 10 MG tablet, Take 1 tablet (10 mg total) by mouth at bedtime., Disp: 90 tablet, Rfl: 0   albuterol  (PROVENTIL ) (2.5 MG/3ML) 0.083% nebulizer solution, Take 3 mLs (2.5 mg total) by nebulization every 4 (four) hours as needed for wheezing or shortness of breath., Disp: 150 mL, Rfl: 1   albuterol  (VENTOLIN  HFA) 108 (90 Base) MCG/ACT inhaler, Inhale 1-2 puffs into the lungs every 4 (four) hours as needed for wheezing or shortness of breath., Disp: 18 g, Rfl: 0   benzonatate  (TESSALON ) 100 MG capsule, Take 1-2 caps PO TID PRN, Disp: 20 capsule, Rfl: 0   cyclobenzaprine  (FLEXERIL ) 10 MG tablet, Take 1 tablet (10 mg total) by mouth 2 (two) times daily as needed for muscle spasms., Disp: 20 tablet, Rfl: 0   DICLEGIS  10-10 MG TBEC, Start with 1 tablet at bedtime, then increase to 2 tablets at bedtime on day 2 if nausea not controlled; Can increase to 1 tablet in the morning and 2 tablets in the evening if nausea not controlled, Disp: 60 tablet, Rfl: 0   fluticasone  (FLONASE ) 50 MCG/ACT nasal spray, Place 2 sprays into both nostrils daily., Disp: 16 g, Rfl: 6   ibuprofen  (ADVIL ) 800 MG tablet, Take 1 tablet (800 mg total) by mouth every 8 (eight) hours as needed., Disp: 60 tablet, Rfl: 0   lidocaine   (LIDODERM ) 5 %, Place 1 patch onto the skin daily. Remove & Discard patch within 12 hours or as directed by MD, Disp: 30 patch, Rfl: 0   montelukast  (SINGULAIR ) 10 MG tablet, Take 1 tablet (10 mg total) by mouth daily., Disp: 90 tablet, Rfl: 0   ondansetron  (ZOFRAN -ODT) 4 MG disintegrating tablet, Take 1 tablet (4 mg total) by mouth every 8 (eight) hours as needed., Disp: 20 tablet, Rfl: 0   Medications ordered in this encounter:  Meds ordered this encounter  Medications   albuterol  (VENTOLIN  HFA) 108 (90 Base) MCG/ACT inhaler    Sig: Inhale 1-2 puffs into the lungs every 4 (four) hours as needed for wheezing or shortness of breath.    Dispense:  18 g    Refill:  0    Supervising Provider:   BLAISE ALEENE KIDD [id number]   albuterol  (PROVENTIL ) (2.5 MG/3ML) 0.083% nebulizer solution    Sig: Take 3 mLs (2.5 mg total) by nebulization every 4 (four) hours as needed for wheezing or shortness of breath.    Dispense:  150 mL    Refill:  1    Supervising Provider:   BLAISE ALEENE KIDD [[id number]]   cetirizine  (ZYRTEC ) 10 MG tablet    Sig: Take 1 tablet (10 mg total) by mouth at bedtime.    Dispense:  90 tablet    Refill:  0    Supervising Provider:   BLAISE ALEENE KIDD [[id number]]     *If you  need refills on other medications prior to your next appointment, please contact your pharmacy*  Follow-Up: Call back or seek an in-person evaluation if the symptoms worsen or if the condition fails to improve as anticipated.  [facility] [phone]  Other Instructions  Symptoms are manageable with albuterol  at this time Utilize albuterol  nebulizer first when possible and use albuterol  inhaler when away from home. Advise you may not use both at the same time and needs to use 1 or the other every 4 hours as needed for wheezing or shortness of breath Restart Zyrtec , prescription requested and sent Schedule visit at earliest convenience with PCP due to 3 asthma exacerbations already in the  last 4 months and likely need for daily maintenance inhaler to prevent exacerbations.  We discussed that this would be a safer option due to pregnancy and inhaled steroids would be preferred over oral steroids for the safety of her and baby.  Educated that with severe symptoms oral steroids can be used if needed but she should be seen in person at urgent care or ER.   If you have been instructed to have an in-person evaluation today at a local Urgent Care facility, please use the link below. It will take you to a list of all of our available [place] [facility], including address, phone number and hours of operation. Please do not delay care.  [facility]  If you or a family member do not have a primary care provider, use the link below to schedule a visit and establish care. When you choose a [facility] primary care physician or advanced practice provider, you gain a long-term partner in health. Find a Primary Care Provider  Learn more about [facility]'s in-office and virtual care options: [facility] [place] - Get Care Now

## 2024-03-10 NOTE — Progress Notes (Signed)
 Virtual Visit Consent   Gail Mathews, you are scheduled for a virtual visit with a [facility] provider today. Just as with appointments in the office, your consent must be obtained to participate. Your consent will be active for this visit and any virtual visit you may have with one of our providers in the next 365 days. If you have a MyChart account, a copy of this consent can be sent to you electronically.  As this is a virtual visit, video technology does not allow for your provider to perform a traditional examination. This may limit your provider's ability to fully assess your condition. If your provider identifies any concerns that need to be evaluated in person or the need to arrange testing (such as labs, EKG, etc.), we will make arrangements to do so. Although advances in technology are sophisticated, we cannot ensure that it will always work on either your end or our end. If the connection with a video visit is poor, the visit may have to be switched to a telephone visit. With either a video or telephone visit, we are not always able to ensure that we have a secure connection.  By engaging in this virtual visit, you consent to the provision of healthcare and authorize for your insurance to be billed (if applicable) for the services provided during this visit. Depending on your insurance coverage, you may receive a charge related to this service.  I need to obtain your verbal consent now. Are you willing to proceed with your visit today? Gail Mathews has provided verbal consent on 03/10/2024 for a virtual visit (video or telephone). Gail DELENA Darby, FNP  Date: 03/10/2024 12:54 PM   Virtual Visit via Video Note   I, Gail Mathews, connected with  Gail Mathews  ([id number], 20-May-1991) on 03/10/24 at 12:30 PM EDT by a video-enabled telemedicine application and verified that I am speaking with the correct person using two identifiers.  Location: Patient: Virtual Visit Location Patient:  Mobile Provider: Virtual Visit Location Provider: Home Office   I discussed the limitations of evaluation and management by telemedicine and the availability of in person appointments. The patient expressed understanding and agreed to proceed.    History of Present Illness: Gail Mathews is a 33 y.o. who identifies as a female who was assigned female at birth, and is being seen today for asthma exacerbation.  She reports that for the last week or so her asthma has been worse.  She reports being out of her nebulizer which is helpful for her in managing her asthma.  She has been using her nebulizer approximately twice a day and inhaler 2-3 times a day in between.  She does report relief of her symptoms after using her albuterol  however the nebulizer does help with her symptoms for longer than the inhaler does.  She also reports she has been taking Zyrtec  but ran out and would like a new prescription for this.  She is currently pregnant and scheduled to see an OB/GYN next Thursday to establish care.  She does have a PCP however she has not seen her PCP recently for her asthma exacerbations.  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   Asthma 06/08/2011    Allergies:  Allergies  Allergen Reactions   Other Swelling    Pecans/walnuts Pecans/walnuts   Amoxicillin Rash   Medications:  Current Outpatient Medications:    cetirizine  (ZYRTEC ) 10 MG tablet,  Take 1 tablet (10 mg total) by mouth at bedtime., Disp: 90 tablet, Rfl: 0   albuterol  (PROVENTIL ) (2.5 MG/3ML) 0.083% nebulizer solution, Take 3 mLs (2.5 mg total) by nebulization every 4 (four) hours as needed for wheezing or shortness of breath., Disp: 150 mL, Rfl: 1   albuterol  (VENTOLIN  HFA) 108 (90 Base) MCG/ACT inhaler, Inhale 1-2 puffs into the lungs every 4 (four) hours as needed for wheezing or shortness of breath., Disp: 18 g, Rfl: 0   benzonatate  (TESSALON )  100 MG capsule, Take 1-2 caps PO TID PRN, Disp: 20 capsule, Rfl: 0   cyclobenzaprine  (FLEXERIL ) 10 MG tablet, Take 1 tablet (10 mg total) by mouth 2 (two) times daily as needed for muscle spasms., Disp: 20 tablet, Rfl: 0   DICLEGIS  10-10 MG TBEC, Start with 1 tablet at bedtime, then increase to 2 tablets at bedtime on day 2 if nausea not controlled; Can increase to 1 tablet in the morning and 2 tablets in the evening if nausea not controlled, Disp: 60 tablet, Rfl: 0   fluticasone  (FLONASE ) 50 MCG/ACT nasal spray, Place 2 sprays into both nostrils daily., Disp: 16 g, Rfl: 6   ibuprofen  (ADVIL ) 800 MG tablet, Take 1 tablet (800 mg total) by mouth every 8 (eight) hours as needed., Disp: 60 tablet, Rfl: 0   lidocaine  (LIDODERM ) 5 %, Place 1 patch onto the skin daily. Remove & Discard patch within 12 hours or as directed by MD, Disp: 30 patch, Rfl: 0   montelukast  (SINGULAIR ) 10 MG tablet, Take 1 tablet (10 mg total) by mouth daily., Disp: 90 tablet, Rfl: 0   ondansetron  (ZOFRAN -ODT) 4 MG disintegrating tablet, Take 1 tablet (4 mg total) by mouth every 8 (eight) hours as needed., Disp: 20 tablet, Rfl: 0  Observations/Objective: Patient is well-developed, well-nourished in no acute distress.  Resting comfortably in her car.  Head is normocephalic, atraumatic.  No labored breathing.  No apparent shortness of breath.  Speaking in complete sentences. Speech is clear and coherent with logical content.  Patient is alert and oriented at baseline.   Assessment and Plan: 1. Moderate persistent asthma with acute exacerbation - albuterol  (VENTOLIN  HFA) 108 (90 Base) MCG/ACT inhaler; Inhale 1-2 puffs into the lungs every 4 (four) hours as needed for wheezing or shortness of breath.  Dispense: 18 g; Refill: 0 - albuterol  (PROVENTIL ) (2.5 MG/3ML) 0.083% nebulizer solution; Take 3 mLs (2.5 mg total) by nebulization every 4 (four) hours as needed for wheezing or shortness of breath.  Dispense: 150 mL; Refill: 1  2.  Seasonal allergies (Primary) - cetirizine  (ZYRTEC ) 10 MG tablet; Take 1 tablet (10 mg total) by mouth at bedtime.  Dispense: 90 tablet; Refill: 0  Symptoms are manageable with albuterol  at this time Utilize albuterol  nebulizer first when possible and use albuterol  inhaler when away from home. Advise she may not use both at the same time and needs to use 1 or the other every 4 hours as needed for wheezing or shortness of breath Restart Zyrtec , Rx requested and sent Schedule visit at earliest convenience with PCP due to 3 asthma exacerbations already in the last 4 months and likely need for daily maintenance inhaler to prevent exacerbations.  We discussed that this would be a safer option due to her pregnancy and inhaled steroids would be preferred over oral steroids for the safety of her and baby.  Educated that with severe symptoms oral steroids can be used if needed.  Follow Up Instructions: I discussed the assessment and  treatment plan with the patient. The patient was provided an opportunity to ask questions and all were answered. The patient agreed with the plan and demonstrated an understanding of the instructions.  A copy of instructions were sent to the patient via MyChart unless otherwise noted below.   The patient was advised to call back or seek an in-person evaluation if the symptoms worsen or if the condition fails to improve as anticipated.    Gail DELENA Darby, FNP

## 2024-03-18 ENCOUNTER — Encounter: Admitting: Obstetrics and Gynecology

## 2024-03-18 ENCOUNTER — Ambulatory Visit (INDEPENDENT_AMBULATORY_CARE_PROVIDER_SITE_OTHER): Payer: Self-pay | Admitting: *Deleted

## 2024-03-18 ENCOUNTER — Other Ambulatory Visit: Payer: Self-pay

## 2024-03-18 VITALS — BP 132/85 | HR 69 | Wt 218.0 lb

## 2024-03-18 DIAGNOSIS — Z3480 Encounter for supervision of other normal pregnancy, unspecified trimester: Secondary | ICD-10-CM

## 2024-03-18 DIAGNOSIS — Z3401 Encounter for supervision of normal first pregnancy, first trimester: Secondary | ICD-10-CM

## 2024-03-18 DIAGNOSIS — Z1331 Encounter for screening for depression: Secondary | ICD-10-CM

## 2024-03-18 DIAGNOSIS — Z3A12 12 weeks gestation of pregnancy: Secondary | ICD-10-CM

## 2024-03-18 DIAGNOSIS — Z362 Encounter for other antenatal screening follow-up: Secondary | ICD-10-CM

## 2024-03-18 DIAGNOSIS — Z3481 Encounter for supervision of other normal pregnancy, first trimester: Secondary | ICD-10-CM | POA: Diagnosis not present

## 2024-03-18 MED ORDER — BLOOD PRESSURE KIT DEVI
1.0000 | 0 refills | Status: AC
Start: 2024-03-18 — End: ?

## 2024-03-18 MED ORDER — VITAFOL GUMMIES 3.33-0.333-34.8 MG PO CHEW: 1.0000 | CHEWABLE_TABLET | Freq: Every day | ORAL | 5 refills | Status: AC

## 2024-03-18 NOTE — Progress Notes (Signed)
 New OB Intake  I connected with Gail Mathews  on 03/18/24 at  9:15 AM EDT by In Person Visit and verified that I am speaking with the correct person using two identifiers. Nurse is located at [facility] and pt is located at [facility].  I discussed the limitations, risks, security and privacy concerns of performing an evaluation and management service by telephone and the availability of in person appointments. I also discussed with the patient that there may be a patient responsible charge related to this service. The patient expressed understanding and agreed to proceed.  I explained I am completing New OB Intake today. We discussed EDD of 09/30/24 based on US  at [redacted]w[redacted]d weeks. Pt is G1P0. I reviewed her allergies, medications and Medical/Surgical/OB history.    Patient Active Problem List   Diagnosis Date Noted   Patellofemoral disorder of right knee 05/01/2022   Asthma 06/08/2011     Concerns addressed today  Delivery Plans Plans to deliver at [facility]. Discussed the nature of our practice with multiple providers including residents and students. Due to the size of the practice, the delivering provider may not be the same as those providing prenatal care.   Patient is not interested in water birth.  MyChart/Babyscripts MyChart access verified. I explained pt will have some visits in office and some virtually. Babyscripts instructions given and order placed. Patient verifies receipt of registration text/e-mail. Account successfully created and app downloaded. If patient is a candidate for Optimized scheduling, add to sticky note.   Blood Pressure Cuff/Weight Scale Blood pressure cuff ordered for patient to pick-up from [employer]. Explained after first prenatal appt pt will check weekly and document in Babyscripts. Patient does not have weight scale; patient may purchase if they desire to track weight weekly in Babyscripts.  Anatomy US  Explained first scheduled US  will be around 19  weeks. Anatomy US  scheduled for TBD at TBD.  Is patient a candidate for Babyscripts Optimization? No, due to Risk Factors   First visit review I reviewed new OB appt with patient. Explained pt will be seen by Dr. Abigail at first visit. Discussed [place] genetic screening with patient. Requests [facility] and [facility].. Routine prenatal labs collected at today's visit.   Last Pap No results found for: DIAGPAP  Gail CHRISTELLA Ober, RN 03/18/2024  9:18 AM

## 2024-03-18 NOTE — Patient Instructions (Signed)

## 2024-03-19 ENCOUNTER — Ambulatory Visit: Payer: Self-pay | Admitting: Obstetrics and Gynecology

## 2024-03-19 DIAGNOSIS — Z3480 Encounter for supervision of other normal pregnancy, unspecified trimester: Secondary | ICD-10-CM

## 2024-03-19 DIAGNOSIS — D563 Thalassemia minor: Secondary | ICD-10-CM

## 2024-03-19 LAB — CBC/D/PLT+RPR+RH+ABO+RUBIGG...
Antibody Screen: NEGATIVE
Basophils Absolute: 0 x10E3/uL (ref 0.0–0.2)
Basos: 0 %
EOS (ABSOLUTE): 0.2 x10E3/uL (ref 0.0–0.4)
Eos: 2 %
HCV Ab: NONREACTIVE
HIV Screen 4th Generation wRfx: NONREACTIVE
Hematocrit: 38.7 % (ref 34.0–46.6)
Hemoglobin: 12.2 g/dL (ref 11.1–15.9)
Hepatitis B Surface Ag: NEGATIVE
Immature Grans (Abs): 0 x10E3/uL (ref 0.0–0.1)
Immature Granulocytes: 0 %
Lymphocytes Absolute: 1.3 x10E3/uL (ref 0.7–3.1)
Lymphs: 14 %
MCH: 25.9 pg — ABNORMAL LOW (ref 26.6–33.0)
MCHC: 31.5 g/dL (ref 31.5–35.7)
MCV: 82 fL (ref 79–97)
Monocytes Absolute: 0.6 x10E3/uL (ref 0.1–0.9)
Monocytes: 6 %
Neutrophils Absolute: 6.9 x10E3/uL (ref 1.4–7.0)
Neutrophils: 78 %
Platelets: 333 x10E3/uL (ref 150–450)
RBC: 4.71 x10E6/uL (ref 3.77–5.28)
RDW: 14.6 % (ref 11.7–15.4)
RPR Ser Ql: NONREACTIVE
Rh Factor: POSITIVE
Rubella Antibodies, IGG: 1.97 {index} (ref >0.99)
WBC: 9.1 x10E3/uL (ref 3.4–10.8)

## 2024-03-19 LAB — HEMOGLOBIN A1C
Est. average glucose Bld gHb Est-mCnc: 111 mg/dL
Hgb A1c MFr Bld: 5.5 % (ref 4.8–5.6)

## 2024-03-19 LAB — HCV INTERPRETATION

## 2024-03-28 LAB — PANORAMA PRENATAL TEST FULL PANEL:PANORAMA TEST PLUS 5 ADDITIONAL MICRODELETIONS: FETAL FRACTION: 4.3

## 2024-03-29 LAB — HORIZON CUSTOM: REPORT SUMMARY: POSITIVE — AB

## 2024-03-31 DIAGNOSIS — D563 Thalassemia minor: Secondary | ICD-10-CM | POA: Insufficient documentation

## 2024-04-01 ENCOUNTER — Other Ambulatory Visit: Payer: Self-pay

## 2024-04-01 DIAGNOSIS — D563 Thalassemia minor: Secondary | ICD-10-CM

## 2024-04-02 ENCOUNTER — Encounter: Payer: Self-pay | Admitting: Physician Assistant

## 2024-04-02 ENCOUNTER — Telehealth

## 2024-04-02 ENCOUNTER — Telehealth: Admitting: Physician Assistant

## 2024-04-02 DIAGNOSIS — Z3490 Encounter for supervision of normal pregnancy, unspecified, unspecified trimester: Secondary | ICD-10-CM

## 2024-04-02 DIAGNOSIS — M545 Low back pain, unspecified: Secondary | ICD-10-CM | POA: Diagnosis not present

## 2024-04-02 MED ORDER — ACETAMINOPHEN 500 MG PO TABS: 500.0000 mg | ORAL_TABLET | Freq: Four times a day (QID) | ORAL | 0 refills | Status: AC | PRN

## 2024-04-02 NOTE — Patient Instructions (Signed)
 Gail Mathews, thank you for joining Teena Shuck, PA-C for today's virtual visit.  While this provider is not your primary care provider (PCP), if your PCP is located in our provider database this encounter information will be shared with them immediately following your visit.   A [facility] MyChart account gives you access to today's visit and all your visits, tests, and labs performed at [facility]  click here if you don't have a [facility] MyChart account or go to [email or web address]  Consent: (Patient) Gail Mathews provided verbal consent for this virtual visit at the beginning of the encounter.  Current Medications:  Current Outpatient Medications:    albuterol  (PROVENTIL ) (2.5 MG/3ML) 0.083% nebulizer solution, Take 3 mLs (2.5 mg total) by nebulization every 4 (four) hours as needed for wheezing or shortness of breath., Disp: 150 mL, Rfl: 1   albuterol  (VENTOLIN  HFA) 108 (90 Base) MCG/ACT inhaler, Inhale 1-2 puffs into the lungs every 4 (four) hours as needed for wheezing or shortness of breath., Disp: 18 g, Rfl: 0   benzonatate  (TESSALON ) 100 MG capsule, Take 1-2 caps PO TID PRN, Disp: 20 capsule, Rfl: 0   Blood Pressure Monitoring (BLOOD PRESSURE KIT) DEVI, 1 Device by Does not apply route once a week., Disp: 1 each, Rfl: 0   cetirizine  (ZYRTEC ) 10 MG tablet, Take 1 tablet (10 mg total) by mouth at bedtime., Disp: 90 tablet, Rfl: 0   cyclobenzaprine  (FLEXERIL ) 10 MG tablet, Take 1 tablet (10 mg total) by mouth 2 (two) times daily as needed for muscle spasms., Disp: 20 tablet, Rfl: 0   DICLEGIS  10-10 MG TBEC, Start with 1 tablet at bedtime, then increase to 2 tablets at bedtime on day 2 if nausea not controlled; Can increase to 1 tablet in the morning and 2 tablets in the evening if nausea not controlled, Disp: 60 tablet, Rfl: 0   fluticasone  (FLONASE ) 50 MCG/ACT nasal spray, Place 2 sprays into both nostrils daily. (Patient not taking: Reported on 03/18/2024),  Disp: 16 g, Rfl: 6   ibuprofen  (ADVIL ) 800 MG tablet, Take 1 tablet (800 mg total) by mouth every 8 (eight) hours as needed., Disp: 60 tablet, Rfl: 0   lidocaine  (LIDODERM ) 5 %, Place 1 patch onto the skin daily. Remove & Discard patch within 12 hours or as directed by MD (Patient not taking: Reported on 03/18/2024), Disp: 30 patch, Rfl: 0   montelukast  (SINGULAIR ) 10 MG tablet, Take 1 tablet (10 mg total) by mouth daily., Disp: 90 tablet, Rfl: 0   ondansetron  (ZOFRAN -ODT) 4 MG disintegrating tablet, Take 1 tablet (4 mg total) by mouth every 8 (eight) hours as needed. (Patient not taking: Reported on 03/18/2024), Disp: 20 tablet, Rfl: 0   Prenatal Vit-Fe Phos-FA-Omega (VITAFOL  GUMMIES) 3.33-0.333-34.8 MG CHEW, Chew 1 tablet by mouth daily., Disp: 90 tablet, Rfl: 5   Medications ordered in this encounter:  No orders of the defined types were placed in this encounter.    *If you need refills on other medications prior to your next appointment, please contact your pharmacy*  Follow-Up: Call back or seek an in-person evaluation if the symptoms worsen or if the condition fails to improve as anticipated.  [facility] [phone]  Other Instructions Please report to the nearest Emergency room with any worsening symptoms. Follow up with primary care provider (PCP) in 2 -3 days.    If you have been instructed to have an in-person evaluation today at a local Urgent Care facility, please use the link  below. It will take you to a list of all of our available [facility], including address, phone number and hours of operation. Please do not delay care.  [facility]  If you or a family member do not have a primary care provider, use the link below to schedule a visit and establish care. When you choose a [facility] primary care physician or advanced practice provider, you gain a long-term partner in health. Find a Primary Care Provider  Learn more about [facility]'s in-office and virtual care options: [facility] - Get Care Now

## 2024-04-02 NOTE — Progress Notes (Signed)
 Virtual Visit Consent   Gail Mathews, you are scheduled for a virtual visit with a [facility] provider today. Just as with appointments in the office, your consent must be obtained to participate. Your consent will be active for this visit and any virtual visit you may have with one of our providers in the next 365 days. If you have a MyChart account, a copy of this consent can be sent to you electronically.  As this is a virtual visit, video technology does not allow for your provider to perform a traditional examination. This may limit your provider's ability to fully assess your condition. If your provider identifies any concerns that need to be evaluated in person or the need to arrange testing (such as labs, EKG, etc.), we will make arrangements to do so. Although advances in technology are sophisticated, we cannot ensure that it will always work on either your end or our end. If the connection with a video visit is poor, the visit may have to be switched to a telephone visit. With either a video or telephone visit, we are not always able to ensure that we have a secure connection.  By engaging in this virtual visit, you consent to the provision of healthcare and authorize for your insurance to be billed (if applicable) for the services provided during this visit. Depending on your insurance coverage, you may receive a charge related to this service.  I need to obtain your verbal consent now. Are you willing to proceed with your visit today? Gail Mathews has provided verbal consent on 04/02/2024 for a virtual visit (video or telephone). Gail Mathews, [place]  Date: 04/02/2024 2:51 PM   Virtual Visit via Video Note   I, Gail Mathews, connected with  Gail Mathews  ([id number], 04-06-1991) on 04/02/24 at  2:45 PM EDT by a video-enabled telemedicine application and verified that I am speaking with the correct person using two identifiers.  Location: Patient: Virtual Visit Location Patient:  Home Provider: Virtual Visit Location Provider: Home Office   I discussed the limitations of evaluation and management by telemedicine and the availability of in person appointments. The patient expressed understanding and agreed to proceed.    History of Present Illness: Gail Mathews is a 33 y.o. who identifies as a female who was assigned female at birth, and is being seen today for back pain.  HPI: Back Pain This is a recurrent problem. The current episode started in the past 7 days. The problem is unchanged. The pain is present in the lumbar spine. The pain is at a severity of 6/10. The pain is mild. Pertinent negatives include no abdominal pain, bladder incontinence, bowel incontinence, chest pain, dysuria, fever, headaches, leg pain, numbness, paresis, paresthesias, pelvic pain, perianal numbness, tingling, weakness or weight loss. She has tried bed rest for the symptoms. The treatment provided no relief.    Problems:  Patient Active Problem List   Diagnosis Date Noted   Alpha thalassemia silent carrier 03/31/2024   Supervision of other normal pregnancy, antepartum 03/18/2024   Patellofemoral disorder of right knee 05/01/2022   Asthma 06/08/2011    Allergies:  Allergies  Allergen Reactions   Other Swelling    Pecans/walnuts Pecans/walnuts   Amoxicillin Rash   Medications:  Current Outpatient Medications:    albuterol  (PROVENTIL ) (2.5 MG/3ML) 0.083% nebulizer solution, Take 3 mLs (2.5 mg total) by nebulization every 4 (four) hours as needed for wheezing or shortness of breath., Disp: 150 mL, Rfl: 1   albuterol  (VENTOLIN  HFA)  108 (90 Base) MCG/ACT inhaler, Inhale 1-2 puffs into the lungs every 4 (four) hours as needed for wheezing or shortness of breath., Disp: 18 g, Rfl: 0   benzonatate  (TESSALON ) 100 MG capsule, Take 1-2 caps PO TID PRN, Disp: 20 capsule, Rfl: 0   Blood Pressure Monitoring (BLOOD PRESSURE KIT) DEVI, 1 Device by Does not apply route once a week., Disp: 1 each,  Rfl: 0   cetirizine  (ZYRTEC ) 10 MG tablet, Take 1 tablet (10 mg total) by mouth at bedtime., Disp: 90 tablet, Rfl: 0   cyclobenzaprine  (FLEXERIL ) 10 MG tablet, Take 1 tablet (10 mg total) by mouth 2 (two) times daily as needed for muscle spasms., Disp: 20 tablet, Rfl: 0   DICLEGIS  10-10 MG TBEC, Start with 1 tablet at bedtime, then increase to 2 tablets at bedtime on day 2 if nausea not controlled; Can increase to 1 tablet in the morning and 2 tablets in the evening if nausea not controlled, Disp: 60 tablet, Rfl: 0   fluticasone  (FLONASE ) 50 MCG/ACT nasal spray, Place 2 sprays into both nostrils daily. (Patient not taking: Reported on 03/18/2024), Disp: 16 g, Rfl: 6   ibuprofen  (ADVIL ) 800 MG tablet, Take 1 tablet (800 mg total) by mouth every 8 (eight) hours as needed., Disp: 60 tablet, Rfl: 0   lidocaine  (LIDODERM ) 5 %, Place 1 patch onto the skin daily. Remove & Discard patch within 12 hours or as directed by MD (Patient not taking: Reported on 03/18/2024), Disp: 30 patch, Rfl: 0   montelukast  (SINGULAIR ) 10 MG tablet, Take 1 tablet (10 mg total) by mouth daily., Disp: 90 tablet, Rfl: 0   ondansetron  (ZOFRAN -ODT) 4 MG disintegrating tablet, Take 1 tablet (4 mg total) by mouth every 8 (eight) hours as needed. (Patient not taking: Reported on 03/18/2024), Disp: 20 tablet, Rfl: 0   Prenatal Vit-Fe Phos-FA-Omega (VITAFOL  GUMMIES) 3.33-0.333-34.8 MG CHEW, Chew 1 tablet by mouth daily., Disp: 90 tablet, Rfl: 5  Observations/Objective: Patient is well-developed, well-nourished in no acute distress.  Resting comfortably  at home.  Head is normocephalic, atraumatic.  No labored breathing.  Speech is clear and coherent with logical content.  Patient is alert and oriented at baseline.    Assessment and Plan: 1. Pregnancy, unspecified gestational age (Primary)  2. Acute bilateral low back pain without sciatica  Patient is currently 4 months pregnant and experiencing new onset back pain. Discussed  treatments such as tylenol  or a heating pad. Advised any additionalt therapies would have to come from her OBGYN. No red flag symptoms or concerns for cauda equina. No acute or emergent condition noted during appointment.   Follow Up Instructions: I discussed the assessment and treatment plan with the patient. The patient was provided an opportunity to ask questions and all were answered. The patient agreed with the plan and demonstrated an understanding of the instructions.  A copy of instructions were sent to the patient via MyChart unless otherwise noted below.    The patient was advised to call back or seek an in-person evaluation if the symptoms worsen or if the condition fails to improve as anticipated.    Gail Shuck, PA-C

## 2024-04-13 NOTE — Progress Notes (Signed)
 Primary care provider: Lauraine KATHEE Burkes, PA-C Referring provider:      Lauraine KATHEE Burkes, PA-C  Assessment     1. Moderate persistent asthma without complication (*)    33 year old female with longstanding asthma presents with moderate persistent asthma and frequent use of albuterol .  She is about 4 months pregnant and is doing well overall.  She would benefit from restarting her maintenance inhaler.  Plan    Start Symbicort  160 /4.5 2 puffs twice a day, rinse mouth after each use Call with worsening symptoms Followup in 4 weeks   Annual influenza vaccination if no contraindications and pneumonia vaccination per current guidelines.   We discussed the diagnosis and treatment plan in detail and the patient expressed understanding.    Thank you for allowing us  to participate in the care of this patient.  Total time spent in this encounter on date of service was 34 minutes and does not include additional procedure time.  Including but not limited to activities such as reviewing patient records, obtaining or reviewing subjective medical history, obtaining or performing a history and physical examination, counseling and/or educating patient/family/caregiver, ordering prescription medication, tests, procedures, imaging, labs, referring to and communicating with other health care providers, documenting appropriate clinical information in the medical record electronic or other, interpreting results of prescribed tests/procedures, communicating those results to the patient/family/representative and coordinating patient care.    Orders Placed This Encounter  Procedures  . Spirometry W Pre/Post Bronch, Lung Vol (N2), DLCO, Pulse CO-OX (HGB or CO)       Subjective   Chief Complaint  Patient presents with  . New Patient    Returning patient from 2019  . Asthma    PFT completed    Patient ID:  Gail Mathews is a 33 y.o. female, lifelong nonsmoker with asthma and last seen in 2020 in our  office presents for follow up and is 4 months pregnant. No leg swelling. No issues with pregnancy. Some wheezing. She is using albuterol  3-4 times a day and has moderate obstruction on PFT.   PULMONARY The patient is not on home oxygen  therapy. She denies a history of blood clots. She denies exposure to asbestos. She denies exposure to tuberculosis.  She denies history of a positive TB skin test.  She denies a history of COPD. She admits to a history of asthma. She denies a recent hospitalization. She denies a prior history of lung cancer.  There is no a prior history of lung surgery.  She denies a prior history pneumonia.  Occupational history: CNA   SLEEP She denies a prior history of sleep apnea.She is not using cpap or bipap. She denies nonrestorative sleep. The patient denies excessive daytime sleepiness.  She does not snore. The snoring is not disruptive. She denies witnessed apneas.  She denies choking and gasping while sleeping. The patient has disturbed sleep.  She denies morning headaches. The snoring does not wake the patient up from sleep. She denies a history of restless leg syndrome.  The patient denies leg movements in her sleep.  She does not act out dreams. She does not grind her teeth.   She denies sleep walking.  She does not have nightmares.    She denies sleep paralysis.  Shedenies hallucinations.  The patient denies cataplexy.   She does work the second or third shift.   She goes to sleep at 1:30 AM and wakes up at 11 AM.  She sleeps for a total of about  8  hours. It take her less than 30 minutes to fall asleep. She wakes up 2 time(s) during the night. She wakes up due to the following: Bathroom.  The patient has nocturia.  The patient denies unexplained arousals from sleep.  She drinks 2 caffeinated beverages a day.    She admits to having a prior sleep study. The sleep study was done at our facility.   The patient denies a family member with sleep apnea.    She has not gained weight recently. The patient's neck size is 19 inches.  Epworth Sleep Questionnaire  How likely are you to doze off or fall asleep in the following situations?  Sitting and reading: 0 = no chance of dozing Watching TV: 2 = moderate chance of dozing Sitting inactive in a public place (such as a theater or a meeting):  0 = no chance of dozing As a passenger in a car for an hour without a break: 3 = high chance of dozing Lying down to rest in the afternoon when circumstances permit: 0 = no chance of dozing Sitting and talking to someone: 0 = no chance of dozing Sitting quiety after a lunch without alcohol: 0 = no chance of dozing In a car, while stopped for a few minutes in traffic: 0 = no chance of dozing  Total Epworth: 5  Allergy  Triggers Air Conditioning, Cats, Cold, Dogs, Dust, and Heat  Occupational Exposures None  Anxiety Screening GAD7 Review       04/13/2024  Generalized Anxiety Disorder 7 item (GAD-7)  1. Feeling nervous, anxious, or on the edge 0  2. Not being able to stop or control worrying 0  3. Worrying too much about different things 0  4. Trouble relaxing 0  5. Being so restless that it's hard to sit still 0  6. Being easily annoyed or irritable 0  7. Feeling afraid as if something awful might happen 0  Total Score 0  Interpretation: Normal     Review of Systems  Constitutional: Negative.   HENT:  Positive for congestion.   Eyes: Negative.   Respiratory:  Positive for cough, shortness of breath and wheezing.   Cardiovascular: Negative.   Gastrointestinal: Negative.   Genitourinary: Negative.   Musculoskeletal: Negative.   Skin: Negative.   Neurological: Negative.   Endo/Heme/Allergies: Negative.   Psychiatric/Behavioral: Negative.    All other systems reviewed and are negative.   History   Past Medical History:  Diagnosis Date  . Allergy    . Anxiety   . Asthma (*)    Past Surgical History:  Procedure Laterality Date   . Mandible fracture surgery  04/18/2009   to line up jaw for braces   Social History[1] Family History  Problem Relation Age of Onset  . Hypertension Mother   . No Known Problems Father   . No Known Problems Brother   . No Known Problems Maternal Grandmother   . No Known Problems Maternal Grandfather   . No Known Problems Paternal Grandmother   . No Known Problems Paternal Grandfather     Medication History      Medication Sig Dispense Refill  . albuterol  (ACCUNEB ) 1.25 MG/3ML nebulizer solution Take 3 mLs (1.25 mg dose) by nebulization every 6 (six) hours as needed for Wheezing. 300 mL 6  . albuterol  sulfate HFA (PROVENTIL ,VENTOLIN ,PROAIR ) 108 (90 Base) MCG/ACT inhaler TAKE 2 PUFFS BY MOUTH EVERY 6 HOURS AS NEEDED FOR WHEEZE 18 g 3  . albuterol  sulfate HFA (PROVENTIL ,VENTOLIN ,PROAIR ) 108 (90 Base)  MCG/ACT inhaler TAKE 2 PUFFS BY MOUTH EVERY 6 HOURS AS NEEDED FOR WHEEZE 18 g 3  . azelastine (ASTELIN) 0.1 % nasal spray two sprays by Both Nostrils route 2 (two) times daily. 30 mL 0  . cetirizine  (ZYRTEC ) 10 mg tablet Take one tablet (10 mg dose) by mouth daily. 30 tablet 2  . fluticasone  (FLOVENT  HFA) 220 mcg/actuation inhaler Inhale two puffs into the lungs 2 (two) times daily. 12 g 2  . montelukast  (SINGULAIR ) 10 MG tablet Take one tablet (10 mg dose) by mouth at bedtime. 90 tablet 1   Current Facility-Administered Medications  Medication Dose Route Frequency Provider Last Rate Last Admin  . cefTRIAXone (ROCEPHIN) injection 500 mg  500 mg Intramuscular ONCE Sarah B Gordon, PA-C            I reviewed the patient's medical,surgical,social and family history. The medications and allergies have been reviewed and updated.   Pulmonary Function Testing/Spirometry  CT Outside Images Abdomen Pelvis Result Date: 04/13/2024 Patient Name:  Gail Mathews Date of Birth:  02/14/1991  female Interpretation Procedure was imported into PACS for comparison purposes only.   XR Outside Images  Chest Result Date: 04/13/2024 Patient Name:  Jaleea Alesi Date of Birth:  May 25, 1991  female Interpretation Procedure was imported into PACS for comparison purposes only.   Radiology   CXR:  Results for orders placed in visit on 07/02/18  XR Chest Pa And Lateral  Narrative INDICATION: Mild intermittent asthma with (acute) exacerbation COMPARISON: None TECHNIQUE: PA and lateral views of the chest  FINDINGS: Support apparatus: None Heart: Normal cardiac size. Mediastinum: No gross adenopathy.  Impression Normal contours. Lungs and pleura: Mild bronchial wall thickening.SABRA  No pneumothorax or pleural effusion. Upper abdomen: Unremarkable. Bones: No acute fracture or aggressive osseous lesion. Additional findings: None   IMPRESSION: Bronchial wall thickening as can be seen in bronchitis or conditions such as asthma. No consolidation.  Electronically Signed by: Alyce Coma    CT Scan: No results found for this or any previous visit.   No results found for this or any previous visit.   No results found for this or any previous visit.   No results found for this or any previous visit.   No results found for this or any previous visit.   No results found for this or any previous visit.   No results found for this or any previous visit.   No results found for this or any previous visit.   VQ Scan: No results found for this or any previous visit.   TTE:   No results found for this or any previous visit.  Objective  BP 110/80 (BP Location: Left Upper Arm, Patient Position: Sitting)   Pulse 106   Resp 16   Ht 5' 9 (1.753 m)   Wt 214 lb (97.1 kg)   LMP  (LMP Unknown)   SpO2 98%   BMI 31.60 kg/m   General appearance:  alert, appears stated age, and cooperative Head:    normocephalic Eyes:    pupils are equal, round and reactive Nose:     normal Mouth:    moist, no thrush, mallampati 4 Neck:    supple, no significant adenopathy, no thyromegaly, no  JVD Chest:    clear to auscultation, no wheezes, rales or rhonchi, symmetric air entry Heart:    normal rate, regular rhythm, normal S1, S2, no murmurs, rubs, clicks or gallops Abdomen:   soft, nontender, nondistended Neurological:   alert, oriented Extremities:  peripheral pulses normal,no  pitting edema , no clubbing or cyanosis  The patient was given a copy of their after visit summary.  Voice-recognition software was used in [employer] of this documentation. Unintended transcription errors may have escaped editorial review.    Immunizations     Name Date Dose VIS Date Route   Influenza 07/14/2012 12:45 PM 0.5 ml 03/27/2012 Intramuscular   Site: Left Deltoid   Given By: Gerhardt Louder, LPN   Manufacturer: [employer]   Lot: [id number]   Comment: [id number]   Influenza 09/25/2011  4:23 PM 0.27ml 03/04/2011 Intramuscular   Site: Right Deltoid   Given By: Gerhardt Louder, LPN   Manufacturer: Sanof[employer]   Lot: [id number]   Influenza 05/30/2011 12:00 AM -- -- --   PPD Test 10/06/2021 0.1 mL -- Intradermal   Lot: [id number]   PPD Test 09/25/2011  4:23 PM 0.1 mL N/A Subcutaneous   Site: Right Arm   Given By: Gerhardt Louder, LPN   Manufacturer: Sanofi Pasteur   Lot: [id number]   Pneumococcal Polysaccharide 07/14/2012 12:46 PM 0.5 mL 06/07/2008 Intramuscular   Site: Right Deltoid   Given By: Gerhardt Louder, LPN   Manufacturer: [employer]ck [employer]   Lot: [id number]   SARS-COV-2 (Covid-19) 06/25/2020 -- -- --   Manufacturer: Pfizer[employer] Inc   Lot: [id number]   SARS-COV-2 (Covid-19) 06/04/2020 -- -- --   Manufacturer: Pfizer, Inc   Lot: [id number]         Patient's Medications       * Accurate as of April 13, 2024 11:11 AM. Reflects encounter med changes as of last refresh          Continued Medications      Instructions  * albuterol  sulfate HFA 108 (90 Base) MCG/ACT inhaler Commonly known as: PROVENTIL ,VENTOLIN ,PROAIR   TAKE 2 PUFFS BY MOUTH EVERY 6 HOURS AS NEEDED FOR WHEEZE   *  albuterol  sulfate HFA 108 (90 Base) MCG/ACT inhaler Commonly known as: PROVENTIL ,VENTOLIN ,PROAIR   TAKE 2 PUFFS BY MOUTH EVERY 6 HOURS AS NEEDED FOR WHEEZE   * albuterol  1.25 MG/3ML nebulizer solution Commonly known as: ACCUNEB   1.25 mg, Nebulization, Every 6 hours as needed   azelastine 0.1 % nasal spray Commonly known as: ASTELIN  2 sprays, Both Nostrils, 2 times a day   cetirizine  10 mg tablet Commonly known as: ZYRTEC   10 mg, Oral, Daily   fluticasone  220 mcg/actuation inhaler Commonly known as: FLOVENT  HFA  2 puffs, Inhalation, 2 times a day   montelukast  10 MG tablet Commonly known as: SINGULAIR   10 mg, Oral, At bedtime      * * This list has 3 medication(s) that are the same as other medications prescribed for you. Read the directions carefully, and ask your doctor or other care provider to review them with you.                   [1] Social History Socioeconomic History  . Marital status: Single  . Number of children: 0  . Years of education: 12  Occupational History  . Occupation: Firefighter  . 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 Smoking status: Never    Passive exposure: Never  . Smokeless tobacco: Never  Vaping Use  . Vaping status: Never Used  Substance and Sexual Activity  . Alcohol use: No  . Drug use: No  . Sexual activity: Yes    Partners: Male    Birth  control/protection: Condom    Comment: Depo-provera  Social History Narrative   Denied - History of Smoking Cigarettes  *Some images could not be shown.

## 2024-04-13 NOTE — Progress Notes (Signed)
 PFT Completed.

## 2024-04-15 ENCOUNTER — Encounter: Payer: Self-pay | Admitting: Obstetrics and Gynecology

## 2024-04-15 ENCOUNTER — Ambulatory Visit: Admitting: Obstetrics and Gynecology

## 2024-04-15 VITALS — BP 113/75 | HR 111 | Wt 218.1 lb

## 2024-04-15 DIAGNOSIS — O99512 Diseases of the respiratory system complicating pregnancy, second trimester: Secondary | ICD-10-CM

## 2024-04-15 DIAGNOSIS — J45909 Unspecified asthma, uncomplicated: Secondary | ICD-10-CM

## 2024-04-15 DIAGNOSIS — D563 Thalassemia minor: Secondary | ICD-10-CM

## 2024-04-15 DIAGNOSIS — Z3A16 16 weeks gestation of pregnancy: Secondary | ICD-10-CM

## 2024-04-15 DIAGNOSIS — Z5320 Procedure and treatment not carried out because of patient's decision for unspecified reasons: Secondary | ICD-10-CM

## 2024-04-15 DIAGNOSIS — Z3480 Encounter for supervision of other normal pregnancy, unspecified trimester: Secondary | ICD-10-CM

## 2024-04-15 NOTE — Progress Notes (Signed)
 Declined PAP today. Want to discuss US .  Wishes to discuss her asthma.

## 2024-04-15 NOTE — Progress Notes (Signed)
 No chief complaint on file.   Subjective:   Gail Mathews is a 33 y.o. G1P0000 at [redacted]w[redacted]d by early ultrasound being seen today for her first obstetrical visit.  Her obstetrical history is significant for asthma. Patient is undecided on whether she will breast feed. Pregnancy history fully reviewed.  Patient reports no complaints. Requests ultrasound today.  Partner had reportedly been escorted out of [facility]. Reports that she is safe at home and with her partner and denies any safety concerns.  Reports hx asthma - never hospitalized for this. Sees a pulmonologist in [place], just saw them earlier this week.  HISTORY: OB History  Gravida Para Term Preterm AB Living  1 0 0 0 0 0  SAB IAB Ectopic Multiple Live Births  0 0 0 0 0    # Outcome Date GA Lbr Len/2nd Weight Sex Type Anes PTL Lv  1 Current              Last pap smear: No results found for: DIAGPAP, HPV, HPVHIGH   Past Medical History:  Diagnosis Date   Allergic rhinitis    Asthma    Past Surgical History:  Procedure Laterality Date   NO PAST SURGERIES     Family History  Problem Relation Age of Onset   Hypertension Mother    Asthma Mother    Allergic rhinitis Mother    Diabetes Father    Social History   Tobacco Use   Smoking status: Never   Smokeless tobacco: Never  Vaping Use   Vaping status: Never Used  Substance Use Topics   Alcohol use: No   Drug use: No   Allergies  Allergen Reactions   Other Swelling    Pecans/walnuts Pecans/walnuts   Amoxicillin Rash   Current Outpatient Medications on File Prior to Visit  Medication Sig Dispense Refill   acetaminophen  (TYLENOL ) 500 MG tablet Take 1 tablet (500 mg total) by mouth every 6 (six) hours as needed for up to 14 days. 30 tablet 0   albuterol  (PROVENTIL ) (2.5 MG/3ML) 0.083% nebulizer solution Take 3 mLs (2.5 mg total) by nebulization every 4 (four) hours as needed for wheezing or shortness of breath. 150 mL 1   albuterol  (VENTOLIN   HFA) 108 (90 Base) MCG/ACT inhaler Inhale 1-2 puffs into the lungs every 4 (four) hours as needed for wheezing or shortness of breath. 18 g 0   Blood Pressure Monitoring (BLOOD PRESSURE KIT) DEVI 1 Device by Does not apply route once a week. 1 each 0   cetirizine  (ZYRTEC ) 10 MG tablet Take 1 tablet (10 mg total) by mouth at bedtime. 90 tablet 0   cyclobenzaprine  (FLEXERIL ) 10 MG tablet Take 1 tablet (10 mg total) by mouth 2 (two) times daily as needed for muscle spasms. 20 tablet 0   DICLEGIS  10-10 MG TBEC Start with 1 tablet at bedtime, then increase to 2 tablets at bedtime on day 2 if nausea not controlled; Can increase to 1 tablet in the morning and 2 tablets in the evening if nausea not controlled 60 tablet 0   montelukast  (SINGULAIR ) 10 MG tablet Take 1 tablet (10 mg total) by mouth daily. 90 tablet 0   Prenatal Vit-Fe Phos-FA-Omega (VITAFOL  GUMMIES) 3.33-0.333-34.8 MG CHEW Chew 1 tablet by mouth daily. 90 tablet 5   benzonatate  (TESSALON ) 100 MG capsule Take 1-2 caps PO TID PRN (Patient not taking: Reported on 04/15/2024) 20 capsule 0   fluticasone  (FLONASE ) 50 MCG/ACT nasal spray Place 2 sprays into both nostrils  daily. (Patient not taking: Reported on 03/18/2024) 16 g 6   ibuprofen  (ADVIL ) 800 MG tablet Take 1 tablet (800 mg total) by mouth every 8 (eight) hours as needed. (Patient not taking: Reported on 04/15/2024) 60 tablet 0   lidocaine  (LIDODERM ) 5 % Place 1 patch onto the skin daily. Remove & Discard patch within 12 hours or as directed by MD (Patient not taking: Reported on 03/18/2024) 30 patch 0   ondansetron  (ZOFRAN -ODT) 4 MG disintegrating tablet Take 1 tablet (4 mg total) by mouth every 8 (eight) hours as needed. (Patient not taking: Reported on 03/18/2024) 20 tablet 0   [DISCONTINUED] ipratropium (ATROVENT ) 0.06 % nasal spray Place 2 sprays into both nostrils 4 (four) times daily. 15 mL 0   No current facility-administered medications on file prior to visit.     Exam   Vitals:    04/15/24 1609  BP: 113/75  Pulse: (!) 111  Weight: 218 lb 1.6 oz (98.9 kg)   Fetal Heart Rate (bpm): 154  General:  Alert, oriented and cooperative. Patient is in no acute distress.  Breast: deferred  Cardiovascular: Normal heart rate noted  Respiratory: Normal respiratory effort, no problems with respiration noted  Abdomen: Soft, gravid, appropriate for gestational age.  Pain/Pressure: Present     Pelvic: Pelvic exam performed in the presence of a chaperone  EGBUS within normal limits  Vagina normal Cervix normal without lesions, pap with HPV collected       Extremities: Normal range of motion.  Edema: None  Mental Status: Normal mood and affect. Normal behavior. Normal judgment and thought content.    Assessment:   Pregnancy: G1P0000 Patient Active Problem List   Diagnosis Date Noted   Alpha thalassemia silent carrier 03/31/2024   Supervision of other normal pregnancy, antepartum 03/18/2024   Patellofemoral disorder of right knee 05/01/2022   Asthma 06/08/2011     Plan:  1. Supervision of other normal pregnancy, antepartum (Primary) Anticipatory guidance - AFP, Serum, Open Spina Bifida  2. Alpha thalassemia silent carrier Reviewed carrier status and recommend genetics referral and FOB testing  3. Asthma during pregnancy Followed by pulmonology, recent visit. Continue singulair , albuterol , flovent , zyrtec  as prescribed  4. Pap smear of cervix declined Declines today  5. [redacted] weeks gestation of pregnancy - AFP, Serum, Open Spina Bifida   Initial labs reviewed Continue prenatal vitamins. Genetic Screening discussed: NIPS, carrier screening and AFP, results reviewed, AFP requested today. Ultrasound discussed; fetal anatomic survey: requested. Problem list reviewed and updated. The nature of [facility] with multiple MDs and other Advanced Practice Providers was explained to patient; also emphasized that residents, students are part  of our team. Routine obstetric precautions reviewed. Return in about 4 weeks (around 05/13/2024).  Rollo ONEIDA Bring, MD, FACOG Obstetrician & Gynecologist, [facility] for [facility], [facility]

## 2024-04-16 ENCOUNTER — Telehealth: Payer: Self-pay

## 2024-04-17 ENCOUNTER — Ambulatory Visit: Payer: Self-pay | Admitting: Obstetrics and Gynecology

## 2024-04-17 LAB — AFP, SERUM, OPEN SPINA BIFIDA
AFP MoM: 1.19
AFP Value: 34.4 ng/mL
Gest. Age on Collection Date: 16 wk
Maternal Age At EDD: 32 a
OSBR Risk 1 IN: 10000
Test Results:: NEGATIVE
Weight: 218 [lb_av]

## 2024-04-26 ENCOUNTER — Ambulatory Visit

## 2024-04-26 DIAGNOSIS — D563 Thalassemia minor: Secondary | ICD-10-CM

## 2024-04-26 NOTE — Progress Notes (Signed)
 [facility] at [facility] [address], [address] Phone:  [phone]   Fax:  [phone]      Virtual Visit via Video Note  I connected with  Gail Mathews on 04/26/24 at 10:30 AM EDT by a video enabled telemedicine application and verified that I am speaking with the correct person using two identifiers.  Location: Patient: Home. Provider: Maternal Fetal Care.  Genetic Counseling Clinic Note:   I spoke with 33 y.o. Gail Mathews today to discuss her carrier screening results. She was referred by Gail Mathews, Gail Mathews. She was accompanied by Gail Mathews.   Pregnancy History:    G1P0. EGA: [redacted]w[redacted]d by US . EDD: 09/30/2024. Reports personal history of asthma. Denies major personal health concerns. Denies bleeding, infections, and fevers in this pregnancy. Denies using tobacco, alcohol, or street drugs in this pregnancy.   Family History:    A three-generation pedigree was created and scanned into Epic under the Media tab.  Gail reports two nieces with sickle cell trait. Gail reports he is not a carrier. Gail Mathews was not found to be a carrier for sickle cell disease. Without review of reports it is possible that another hemoglobinopathy may be present and not included on the currently available carrier screening. The couple is encouraged to reach out with clarifying reports if available.  Maternal ethnicity reported as Black and paternal ethnicity reported as Black. Denies Ashkenazi Jewish ancestry.  Family history not remarkable for consanguinity, individuals with birth defects, intellectual disability, autism spectrum disorder, multiple spontaneous abortions, still births, or unexplained neonatal death.   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 She screened negative for the other three conditions (CF,  SMA, and beta-hemoglobinopathies) which significantly reduces but does not eliminate the chance of being a carrier for those conditions. Please see report for details.  We reviewed the genetics of alpha thalassemia, autosomal recessive mode of inheritance, and clinical features of these conditions. We reviewed that Gail Mathews will either pass down two copies of the alpha globin gene (??) OR one copy of the alpha globin gene (-?) in each pregnancy. Therefore, this pregnancy is not at increased risk for hemoglobin Bart's due to four deletions of the alpha globin genes (--/--) regardless of her reproductive partner's carrier status. The pregnancy will be at increased risk (25%) for hemoglobin H disease if her partner is an alpha thalassemia carrier in the cis configuration (--/??). We reviewed that this is more common in [name] populations and less common in those with Black ancestry.  Given these results, we discussed and offered carrier screening for Gail Mathews's reproductive partner. We reviewed the benefits and limitations of carrier screening and that it can detect most but not all carriers.  The couple consented carrier screening for Gail. They requested a saliva kit be sent to his address. He consented we call the patient with the results. We reviewed that if both were found to be carriers, prenatal diagnosis through amniocentesis would be available. We reviewed the technical aspects, benefits, risks, and limitations of amniocentesis including the 1 in 500 risk for miscarriage.  Alternatively, testing can be completed postnatally. Of note, alpha thalassemia is not included on [place] 's Newborn Screening (NBS) program.   Newborn Screening. The [place]  Newborn Screening (NBS) program will screen all newborn babies for cystic fibrosis, spinal muscular atrophy,  hemoglobinopathies, and numerous other conditions.  Previous Testing Completed:  Low risk NIPS: Gail Mathews previously completed  noninvasive prenatal screening (NIPS) in this pregnancy. The result is low risk, consistent with a female fetus. This screening significantly reduces but does not eliminate the chance that the current pregnancy has Down syndrome (trisomy 44), trisomy 77, trisomy 13, common sex chromosome conditions, and 22q11.2 microdeletion syndrome. Please see report for details. There are many genetic conditions that cannot be detected by NIPS.   Negative ms-AFP screening: Gail Mathews previously completed a maternal serum AFP screen in this pregnancy. The result is screen negative. Please see report for details. A negative result reduces the risk that the current pregnancy has an open neural tube defect. Closed neural tube defects and some open defects may not be detected by this screen.   Plan of Care:   [facility] saliva kit to be mailed to Gail's address for alpha thalassemia carrier screening. MFC anatomy ultrasound on 05/19/2024.   Informed consent was obtained. All questions were answered.   45 minutes were spent on the date of the encounter in service to the patient including preparation, consultation through video chat, discussion of test reports and available next steps, pedigree construction, genetic risk assessment, documentation, and care coordination.    Thank you for sharing in the care of Gail Mathews with us .  Please do not hesitate to contact us  at [phone] if you have any questions.   Lauraine Bodily, MS, [facility] Certified Genetic Counselor   Genetic counseling student involved in appointment: No.

## 2024-05-07 DIAGNOSIS — O99210 Obesity complicating pregnancy, unspecified trimester: Secondary | ICD-10-CM | POA: Insufficient documentation

## 2024-05-13 ENCOUNTER — Ambulatory Visit (INDEPENDENT_AMBULATORY_CARE_PROVIDER_SITE_OTHER): Admitting: Obstetrics and Gynecology

## 2024-05-13 ENCOUNTER — Encounter: Payer: Self-pay | Admitting: Obstetrics and Gynecology

## 2024-05-13 VITALS — BP 100/67 | HR 102 | Wt 214.0 lb

## 2024-05-13 DIAGNOSIS — O99519 Diseases of the respiratory system complicating pregnancy, unspecified trimester: Secondary | ICD-10-CM

## 2024-05-13 DIAGNOSIS — O99210 Obesity complicating pregnancy, unspecified trimester: Secondary | ICD-10-CM | POA: Diagnosis not present

## 2024-05-13 DIAGNOSIS — D563 Thalassemia minor: Secondary | ICD-10-CM

## 2024-05-13 DIAGNOSIS — J45909 Unspecified asthma, uncomplicated: Secondary | ICD-10-CM

## 2024-05-13 DIAGNOSIS — J4541 Moderate persistent asthma with (acute) exacerbation: Secondary | ICD-10-CM

## 2024-05-13 DIAGNOSIS — Z3480 Encounter for supervision of other normal pregnancy, unspecified trimester: Secondary | ICD-10-CM

## 2024-05-13 MED ORDER — ASPIRIN 81 MG PO TBEC: 81.0000 mg | DELAYED_RELEASE_TABLET | Freq: Every day | ORAL | 2 refills | Status: DC

## 2024-05-13 MED ORDER — ALBUTEROL SULFATE HFA 108 (90 BASE) MCG/ACT IN AERS: 1.0000 | INHALATION_SPRAY | RESPIRATORY_TRACT | 0 refills | Status: DC | PRN

## 2024-05-13 NOTE — Progress Notes (Signed)
   PRENATAL VISIT NOTE  Subjective:  Gail Mathews is a 33 y.o. G1P0000 at [redacted]w[redacted]d being seen today for ongoing prenatal care.  She is currently monitored for the following issues for this low-risk pregnancy and has Asthma; Supervision of other normal pregnancy, antepartum; Alpha thalassemia silent carrier; and Obesity affecting pregnancy, antepartum on their problem list.  Patient reports no complaints.  Contractions: Not present. Vag. Bleeding: None.  Movement: Present. Denies leaking of fluid.   The following portions of the patient's history were reviewed and updated as appropriate: allergies, current medications, past family history, past medical history, past social history, past surgical history and problem list.   Objective:   Vitals:   05/13/24 1054  BP: 100/67  Pulse: (!) 102  Weight: 214 lb (97.1 kg)   Body mass index is 31.6 kg/m. Total weight gain: -4 lb (-1.814 kg)   Fetal Status: Fetal Heart Rate (bpm): 156   Movement: Present     General:  Alert, oriented and cooperative. Patient is in no acute distress.  Skin: Skin is warm and dry. No rash noted.   Cardiovascular: Normal heart rate noted  Respiratory: Normal respiratory effort, no problems with respiration noted  Abdomen: Soft, gravid, appropriate for gestational age.  Pain/Pressure: Present     Pelvic: Cervical exam deferred        Extremities: Normal range of motion.  Edema: None  Mental Status: Normal mood and affect. Normal behavior. Normal judgment and thought content.   Assessment and Plan:  Pregnancy: G1P0000 at [redacted]w[redacted]d 1. Supervision of other normal pregnancy, antepartum (Primary) Anticipatory guidance  2. Obesity affecting pregnancy, antepartum, unspecified obesity type Start aspirin  - aspirin  EC 81 MG tablet; Take 1 tablet (81 mg total) by mouth at bedtime. For preeclampsia prevention  Dispense: 300 tablet; Refill: 2  3. Asthma during pregnancy Refill on inhaler  4. Alpha thalassemia silent  carrier S/p genetic counseling, FOB testing pending  5. Moderate persistent asthma with acute exacerbation - albuterol  (VENTOLIN  HFA) 108 (90 Base) MCG/ACT inhaler; Inhale 1-2 puffs into the lungs every 4 (four) hours as needed for wheezing or shortness of breath.  Dispense: 18 g; Refill: 0   Preterm labor symptoms and general obstetric precautions including but not limited to vaginal bleeding, contractions, leaking of fluid and fetal movement were reviewed in detail with the patient. Please refer to After Visit Summary for other counseling recommendations.   Return in about 4 weeks (around 06/10/2024).  Future Appointments  Date Time Provider Department Center  05/19/2024  9:00 AM [facility] PROVIDER 1 [facility]  05/19/2024  9:30 AM [facility] US6 [facility]  06/10/2024 11:15 AM Nicholaus Jorene BRAVO, PA-C [facility] None    Rollo ONEIDA Bring, MD

## 2024-05-13 NOTE — Progress Notes (Signed)
 ROB, c/o back pain 5/10.

## 2024-05-19 ENCOUNTER — Ambulatory Visit: Attending: Obstetrics and Gynecology | Admitting: Obstetrics and Gynecology

## 2024-05-19 ENCOUNTER — Ambulatory Visit

## 2024-05-19 ENCOUNTER — Other Ambulatory Visit: Payer: Self-pay | Admitting: *Deleted

## 2024-05-19 VITALS — BP 112/67 | HR 112

## 2024-05-19 DIAGNOSIS — Z363 Encounter for antenatal screening for malformations: Secondary | ICD-10-CM | POA: Insufficient documentation

## 2024-05-19 DIAGNOSIS — Z3480 Encounter for supervision of other normal pregnancy, unspecified trimester: Secondary | ICD-10-CM | POA: Diagnosis not present

## 2024-05-19 DIAGNOSIS — O99212 Obesity complicating pregnancy, second trimester: Secondary | ICD-10-CM | POA: Diagnosis not present

## 2024-05-19 DIAGNOSIS — Z3A20 20 weeks gestation of pregnancy: Secondary | ICD-10-CM | POA: Insufficient documentation

## 2024-05-19 DIAGNOSIS — Z148 Genetic carrier of other disease: Secondary | ICD-10-CM | POA: Diagnosis not present

## 2024-05-19 DIAGNOSIS — O99210 Obesity complicating pregnancy, unspecified trimester: Secondary | ICD-10-CM

## 2024-05-19 DIAGNOSIS — Z362 Encounter for other antenatal screening follow-up: Secondary | ICD-10-CM

## 2024-05-19 NOTE — Progress Notes (Signed)
 After review, MFM consult with provider is not indicated for today  Arna Ranks, MD 05/19/2024 1:33 PM  [facility]

## 2024-05-27 ENCOUNTER — Encounter: Payer: Self-pay | Admitting: Obstetrics and Gynecology

## 2024-06-10 ENCOUNTER — Ambulatory Visit (INDEPENDENT_AMBULATORY_CARE_PROVIDER_SITE_OTHER): Admitting: Physician Assistant

## 2024-06-10 ENCOUNTER — Encounter: Payer: Self-pay | Admitting: Physician Assistant

## 2024-06-10 ENCOUNTER — Other Ambulatory Visit

## 2024-06-10 VITALS — BP 125/76 | HR 94 | Wt 212.8 lb

## 2024-06-10 DIAGNOSIS — Z3480 Encounter for supervision of other normal pregnancy, unspecified trimester: Secondary | ICD-10-CM

## 2024-06-10 DIAGNOSIS — Z3A24 24 weeks gestation of pregnancy: Secondary | ICD-10-CM

## 2024-06-10 DIAGNOSIS — O99210 Obesity complicating pregnancy, unspecified trimester: Secondary | ICD-10-CM | POA: Diagnosis not present

## 2024-06-10 NOTE — Progress Notes (Signed)
   PRENATAL VISIT NOTE  Subjective:  Gail Mathews is a 33 y.o. G1P0000 at [redacted]w[redacted]d being seen today for ongoing prenatal care.  She is currently monitored for the following issues for this low-risk pregnancy and has Asthma; Supervision of other normal pregnancy, antepartum; Alpha thalassemia silent carrier; and Obesity affecting pregnancy, antepartum on their problem list.  Patient reports no complaints.  Contractions: Not present. Vag. Bleeding: None.  Movement: Present. Denies leaking of fluid.   The following portions of the patient's history were reviewed and updated as appropriate: allergies, current medications, past family history, past medical history, past social history, past surgical history and problem list.   Objective:    Vitals:   06/10/24 1120  BP: 125/76  Pulse: 94  Weight: 212 lb 12.8 oz (96.5 kg)    Fetal Status:  Fetal Heart Rate (bpm): 154 Fundal Height: 24 cm Movement: Present    General: Alert, oriented and cooperative. Patient is in no acute distress.  Skin: Skin is warm and dry. No rash noted.   Cardiovascular: Normal heart rate noted  Respiratory: Normal respiratory effort, no problems with respiration noted  Abdomen: Soft, gravid, appropriate for gestational age.  Pain/Pressure: Absent     Pelvic: Cervical exam deferred        Extremities: Normal range of motion.  Edema: None  Mental Status: Normal mood and affect. Normal behavior. Normal judgment and thought content.   Assessment and Plan:  Pregnancy: G1P0000 at [redacted]w[redacted]d  1. Supervision of other normal pregnancy, antepartum (Primary) Patient doing well, feeling regular fetal movement BP, FHR, FH appropriate   2. [redacted] weeks gestation of pregnancy Anticipatory guidance about next visits/weeks of pregnancy given.  Discussed fasting for GTT next visit and 2 hr visit  F/u anatomy scan 05/19/24  3. Obesity affecting pregnancy, antepartum, unspecified obesity type Continue ASA  Preterm labor symptoms and  general obstetric precautions including but not limited to vaginal bleeding, contractions, leaking of fluid and fetal movement were reviewed in detail with the patient.  Please refer to After Visit Summary for other counseling recommendations.   No follow-ups on file.  Future Appointments  Date Time Provider Department Center  06/17/2024  2:45 PM [facility] PROVIDER 1 [facility]  06/17/2024  3:00 PM [facility] US1 [facility]  07/07/2024  8:30 AM [facility] LAB [facility] None  07/07/2024  8:55 AM Constant, Winton, MD [facility] None  07/21/2024 11:15 AM Daring Nidia CROME, FNP [facility] None    Jorene FORBES Moats, PA-C

## 2024-06-10 NOTE — Progress Notes (Signed)
 Pt presents for ROB visit. No concerns

## 2024-06-17 ENCOUNTER — Ambulatory Visit (HOSPITAL_BASED_OUTPATIENT_CLINIC_OR_DEPARTMENT_OTHER)

## 2024-06-17 ENCOUNTER — Ambulatory Visit: Attending: Obstetrics and Gynecology | Admitting: Maternal & Fetal Medicine

## 2024-06-17 ENCOUNTER — Other Ambulatory Visit: Payer: Self-pay | Admitting: Obstetrics and Gynecology

## 2024-06-17 VITALS — BP 125/68

## 2024-06-17 DIAGNOSIS — O99212 Obesity complicating pregnancy, second trimester: Secondary | ICD-10-CM

## 2024-06-17 DIAGNOSIS — Z3A25 25 weeks gestation of pregnancy: Secondary | ICD-10-CM

## 2024-06-17 DIAGNOSIS — J4541 Moderate persistent asthma with (acute) exacerbation: Secondary | ICD-10-CM

## 2024-06-17 DIAGNOSIS — Z362 Encounter for other antenatal screening follow-up: Secondary | ICD-10-CM | POA: Insufficient documentation

## 2024-06-17 DIAGNOSIS — O99012 Anemia complicating pregnancy, second trimester: Secondary | ICD-10-CM | POA: Diagnosis not present

## 2024-06-17 DIAGNOSIS — E669 Obesity, unspecified: Secondary | ICD-10-CM | POA: Diagnosis not present

## 2024-06-17 DIAGNOSIS — O99210 Obesity complicating pregnancy, unspecified trimester: Secondary | ICD-10-CM

## 2024-06-17 DIAGNOSIS — D563 Thalassemia minor: Secondary | ICD-10-CM | POA: Diagnosis not present

## 2024-06-17 NOTE — Progress Notes (Signed)
 After review, MFM consult with provider is not indicated for today  William Glenn, DO 06/17/2024 3:36 PM  [facility]

## 2024-07-07 ENCOUNTER — Encounter: Payer: Self-pay | Admitting: Obstetrics and Gynecology

## 2024-07-07 ENCOUNTER — Other Ambulatory Visit

## 2024-07-07 ENCOUNTER — Ambulatory Visit: Admitting: Obstetrics and Gynecology

## 2024-07-07 VITALS — BP 121/78 | HR 111 | Wt 214.0 lb

## 2024-07-07 DIAGNOSIS — O10912 Unspecified pre-existing hypertension complicating pregnancy, second trimester: Secondary | ICD-10-CM | POA: Diagnosis not present

## 2024-07-07 DIAGNOSIS — Z3A27 27 weeks gestation of pregnancy: Secondary | ICD-10-CM | POA: Diagnosis not present

## 2024-07-07 DIAGNOSIS — O99210 Obesity complicating pregnancy, unspecified trimester: Secondary | ICD-10-CM

## 2024-07-07 DIAGNOSIS — O10919 Unspecified pre-existing hypertension complicating pregnancy, unspecified trimester: Secondary | ICD-10-CM | POA: Insufficient documentation

## 2024-07-07 DIAGNOSIS — O99212 Obesity complicating pregnancy, second trimester: Secondary | ICD-10-CM

## 2024-07-07 DIAGNOSIS — Z3480 Encounter for supervision of other normal pregnancy, unspecified trimester: Secondary | ICD-10-CM

## 2024-07-07 NOTE — Progress Notes (Signed)
 MFM noted growth every 4-6 weeks for growth- testing at 32 weeks, pt is not currently scheduled.

## 2024-07-07 NOTE — Progress Notes (Signed)
   PRENATAL VISIT NOTE  Subjective:  Gail Mathews is a 33 y.o. G1P0000 at [redacted]w[redacted]d being seen today for ongoing prenatal care.  She is currently monitored for the following issues for this high-risk pregnancy and has Asthma; Supervision of other normal pregnancy, antepartum; Alpha thalassemia silent carrier; Obesity affecting pregnancy, antepartum; and Chronic hypertension affecting pregnancy on their problem list.  Patient reports no complaints.  Contractions: Not present. Vag. Bleeding: None.  Movement: Present. Denies leaking of fluid.   The following portions of the patient's history were reviewed and updated as appropriate: allergies, current medications, past family history, past medical history, past social history, past surgical history and problem list.   Objective:   Vitals:   07/07/24 0850  BP: 121/78  Pulse: (!) 111  Weight: 214 lb (97.1 kg)    Fetal Status:  Fetal Heart Rate (bpm): 150   Movement: Present    General: Alert, oriented and cooperative. Patient is in no acute distress.  Skin: Skin is warm and dry. No rash noted.   Cardiovascular: Normal heart rate noted  Respiratory: Normal respiratory effort, no problems with respiration noted  Abdomen: Soft, gravid, appropriate for gestational age.  Pain/Pressure: Absent     Pelvic: Cervical exam deferred        Extremities: Normal range of motion.     Mental Status: Normal mood and affect. Normal behavior. Normal judgment and thought content.      03/18/2024    9:45 AM  Depression screen PHQ 2/9  Decreased Interest 0  Down, Depressed, Hopeless 0  PHQ - 2 Score 0  Altered sleeping 0  Tired, decreased energy 0  Change in appetite 0  Feeling bad or failure about yourself  0  Trouble concentrating 0  Moving slowly or fidgety/restless 0  Suicidal thoughts 0  PHQ-9 Score 0        03/18/2024    9:45 AM  GAD 7 : Generalized Anxiety Score  Nervous, Anxious, on Edge 0  Control/stop worrying 0  Worry too much -  different things 0  Trouble relaxing 0  Restless 0  Easily annoyed or irritable 0  Afraid - awful might happen 0  Total GAD 7 Score 0    Assessment and Plan:  Pregnancy: G1P0000 at [redacted]w[redacted]d 1. Supervision of other normal pregnancy, antepartum (Primary) Patient is doing well without complaints Third trimester labs and glucola Patient is researching pediatricians and plans depo for contraception - Glucose Tolerance, 2 Hours w/1 Hour - RPR - CBC - HIV antibody (with reflex)  2. [redacted] weeks gestation of pregnancy - Glucose Tolerance, 2 Hours w/1 Hour - RPR - CBC - HIV antibody (with reflex)  3. Obesity affecting pregnancy, antepartum, unspecified obesity type   4. Chronic hypertension affecting pregnancy BP stable without medications Continue ASA Follow up growth ultrasound ordered  Preterm labor symptoms and general obstetric precautions including but not limited to vaginal bleeding, contractions, leaking of fluid and fetal movement were reviewed in detail with the patient. Please refer to After Visit Summary for other counseling recommendations.   Return in about 2 weeks (around 07/21/2024) for in person, ROB, High risk.  Future Appointments  Date Time Provider Department Center  07/21/2024 11:15 AM Daring Nidia CROME, FNP [facility] None    Winton Felt, MD

## 2024-07-08 ENCOUNTER — Encounter: Admitting: Obstetrics and Gynecology

## 2024-07-08 ENCOUNTER — Other Ambulatory Visit

## 2024-07-08 LAB — CBC
Hematocrit: 34.7 % (ref 34.0–46.6)
Hemoglobin: 10.6 g/dL — ABNORMAL LOW (ref 11.1–15.9)
MCH: 26 pg — ABNORMAL LOW (ref 26.6–33.0)
MCHC: 30.5 g/dL — ABNORMAL LOW (ref 31.5–35.7)
MCV: 85 fL (ref 79–97)
Platelets: 251 x10E3/uL (ref 150–450)
RBC: 4.08 x10E6/uL (ref 3.77–5.28)
RDW: 14.2 % (ref 11.7–15.4)
WBC: 14.1 x10E3/uL — ABNORMAL HIGH (ref 3.4–10.8)

## 2024-07-08 LAB — HIV ANTIBODY (ROUTINE TESTING W REFLEX): HIV Screen 4th Generation wRfx: NONREACTIVE

## 2024-07-08 LAB — GLUCOSE TOLERANCE, 2 HOURS W/ 1HR
Glucose, 1 hour: 114 mg/dL (ref 70–179)
Glucose, 2 hour: 93 mg/dL (ref 70–152)
Glucose, Fasting: 74 mg/dL (ref 70–91)

## 2024-07-08 LAB — RPR: RPR Ser Ql: NONREACTIVE

## 2024-07-21 ENCOUNTER — Other Ambulatory Visit (HOSPITAL_COMMUNITY)
Admission: RE | Admit: 2024-07-21 | Discharge: 2024-07-21 | Disposition: A | Discharge status: Home / Self Care | Source: Ambulatory Visit | Attending: Obstetrics and Gynecology | Admitting: Obstetrics and Gynecology

## 2024-07-21 ENCOUNTER — Encounter: Payer: Self-pay | Admitting: Obstetrics and Gynecology

## 2024-07-21 ENCOUNTER — Ambulatory Visit: Admitting: Obstetrics and Gynecology

## 2024-07-21 VITALS — BP 116/70 | HR 98 | Wt 216.2 lb

## 2024-07-21 DIAGNOSIS — Z3A29 29 weeks gestation of pregnancy: Secondary | ICD-10-CM | POA: Diagnosis not present

## 2024-07-21 DIAGNOSIS — O10913 Unspecified pre-existing hypertension complicating pregnancy, third trimester: Secondary | ICD-10-CM

## 2024-07-21 DIAGNOSIS — R8290 Unspecified abnormal findings in urine: Secondary | ICD-10-CM | POA: Diagnosis not present

## 2024-07-21 DIAGNOSIS — Z3480 Encounter for supervision of other normal pregnancy, unspecified trimester: Secondary | ICD-10-CM

## 2024-07-21 DIAGNOSIS — N898 Other specified noninflammatory disorders of vagina: Secondary | ICD-10-CM | POA: Insufficient documentation

## 2024-07-21 DIAGNOSIS — O10919 Unspecified pre-existing hypertension complicating pregnancy, unspecified trimester: Secondary | ICD-10-CM

## 2024-07-21 LAB — POCT URINALYSIS DIPSTICK
Bilirubin, UA: NEGATIVE
Blood, UA: NEGATIVE
Glucose, UA: NEGATIVE
Ketones, UA: NEGATIVE
Nitrite, UA: NEGATIVE
Protein, UA: POSITIVE — AB
Spec Grav, UA: 1.015 (ref 1.010–1.025)
Urobilinogen, UA: 0.2 U/dL
pH, UA: 6.5 (ref 5.0–8.0)

## 2024-07-21 NOTE — Progress Notes (Signed)
 Pt reports dc that appears white and cloudy urine. No pain. No itching. No odor.   Lower back pain.   Asking about when she would come out of work. FMLA.

## 2024-07-21 NOTE — Progress Notes (Signed)
   PRENATAL VISIT NOTE  Subjective:  Gail Mathews is a 33 y.o. G1P0000 at [redacted]w[redacted]d being seen today for ongoing prenatal care.  She is currently monitored for the following issues for this high-risk pregnancy and has Asthma; Supervision of other normal pregnancy, antepartum; Alpha thalassemia silent carrier; Obesity affecting pregnancy, antepartum; and Chronic hypertension affecting pregnancy on their problem list.  Patient reports reports vaginal discharge and cloudy urine .  Reports low back pain. Works as a LAWYER at assisted living . Job is willing to accommodate if has a physicist, medical. Inquiring about FMLA Contractions: Not present. Vag. Bleeding: None.  Movement: Present. Denies leaking of fluid.   The following portions of the patient's history were reviewed and updated as appropriate: allergies, current medications, past family history, past medical history, past social history, past surgical history and problem list.   Objective:   Vitals:   07/21/24 1111  BP: 116/70  Pulse: 98  Weight: 216 lb 3.2 oz (98.1 kg)    Fetal Status:  Fetal Heart Rate (bpm): 143   Movement: Present    General: Alert, oriented and cooperative. Patient is in no acute distress.  Skin: Skin is warm and dry. No rash noted.   Cardiovascular: Normal heart rate noted  Respiratory: Normal respiratory effort, no problems with respiration noted  Abdomen: Soft, gravid, appropriate for gestational age.  Pain/Pressure: Absent     Pelvic: Cervical exam deferred        Extremities: Normal range of motion.  Edema: Trace  Mental Status: Normal mood and affect. Normal behavior. Normal judgment and thought content.    Assessment and Plan:  Pregnancy: G1P0000 at [redacted]w[redacted]d 1. Supervision of other normal pregnancy, antepartum (Primary) BP and FHR normal    2. Vaginal discharge Swab today  - Cervicovaginal ancillary only( [place]) - POCT Urinalysis Dipstick  3. Chronic hypertension affecting  pregnancy Normotensive Continue ASA  Follow up u/s 12/8 Start antenatal testing 32 weeks  4. [redacted] weeks gestation of pregnancy Declines TDAP and flu  Supportive measures discussed for back pain, encourage maternity belt. Accommodation letter sent through [place], she has already discussed with job. Discussed FMLA they give her 12 weeks, discussed if going out sooner than date, that can affect return date, also allow 7-10 days for paperwork    Preterm labor symptoms and general obstetric precautions including but not limited to vaginal bleeding, contractions, leaking of fluid and fetal movement were reviewed in detail with the patient. Please refer to After Visit Summary for other counseling recommendations.   Return in about 2 weeks (around 08/04/2024) for OB VISIT (MD or APP).  Future Appointments  Date Time Provider Department Center  08/09/2024 10:15 AM [facility] PROVIDER 1 [facility] [place]  08/09/2024 10:30 AM [facility] US2 [facility]  08/09/2024  1:50 PM Daring Nidia CROME, FNP [facility] None    Nidia Daring, FNP

## 2024-07-23 ENCOUNTER — Ambulatory Visit: Payer: Self-pay | Admitting: Obstetrics and Gynecology

## 2024-07-23 DIAGNOSIS — O98219 Gonorrhea complicating pregnancy, unspecified trimester: Secondary | ICD-10-CM

## 2024-07-23 LAB — URINE CULTURE

## 2024-07-27 ENCOUNTER — Inpatient Hospital Stay (HOSPITAL_COMMUNITY)
Admission: AD | Admit: 2024-07-27 | Discharge: 2024-07-28 | Disposition: A | Discharge status: Home / Self Care | Payer: Self-pay | Attending: Obstetrics & Gynecology | Admitting: Obstetrics & Gynecology

## 2024-07-27 DIAGNOSIS — A5402 Gonococcal vulvovaginitis, unspecified: Secondary | ICD-10-CM | POA: Insufficient documentation

## 2024-07-27 DIAGNOSIS — O98213 Gonorrhea complicating pregnancy, third trimester: Secondary | ICD-10-CM | POA: Insufficient documentation

## 2024-07-27 DIAGNOSIS — O23593 Infection of other part of genital tract in pregnancy, third trimester: Secondary | ICD-10-CM | POA: Insufficient documentation

## 2024-07-27 DIAGNOSIS — B379 Candidiasis, unspecified: Secondary | ICD-10-CM

## 2024-07-27 DIAGNOSIS — Z3A30 30 weeks gestation of pregnancy: Secondary | ICD-10-CM | POA: Insufficient documentation

## 2024-07-27 DIAGNOSIS — B9689 Other specified bacterial agents as the cause of diseases classified elsewhere: Secondary | ICD-10-CM | POA: Insufficient documentation

## 2024-07-27 DIAGNOSIS — B3731 Acute candidiasis of vulva and vagina: Secondary | ICD-10-CM | POA: Insufficient documentation

## 2024-07-27 DIAGNOSIS — O98813 Other maternal infectious and parasitic diseases complicating pregnancy, third trimester: Secondary | ICD-10-CM | POA: Insufficient documentation

## 2024-07-27 LAB — CERVICOVAGINAL ANCILLARY ONLY
Bacterial Vaginitis (gardnerella): POSITIVE — AB
Candida Glabrata: NEGATIVE
Candida Vaginitis: POSITIVE — AB
Chlamydia: NEGATIVE
Comment: NEGATIVE
Comment: NEGATIVE
Comment: NEGATIVE
Comment: NEGATIVE
Comment: NEGATIVE
Comment: NORMAL
Neisseria Gonorrhea: POSITIVE — AB
Trichomonas: NEGATIVE

## 2024-07-27 NOTE — MAU Note (Signed)
 MAU Triage Note: Gail Mathews is a 33 y.o. at [redacted]w[redacted]d here in MAU reporting: received test results from OB-GYN stating that she was positive for gonorrhea, BV, and candida vaginitis. She reports that they informed her to come in for treatment. Reports mild burning sensation when she urinates, denies any other complaints or abdominal pain. Reports white, sticky discharge. Denies VB. Reports +FM.  Patient complaint: ck for std        Onset of complaint: on-going LMP: Patient's last menstrual period was 01/05/2024.  Vitals:   07/27/24 2359  BP: 108/66  Pulse: (!) 104  Resp: 20  Temp: 98.1 F (36.7 C)  SpO2: 100%    FHT:  Fetal Heart Rate Mode: Doppler Baseline Rate (A): 160 bpm Lab orders placed from triage: N/A

## 2024-07-28 ENCOUNTER — Encounter (HOSPITAL_COMMUNITY): Payer: Self-pay | Admitting: Obstetrics & Gynecology

## 2024-07-28 DIAGNOSIS — B9689 Other specified bacterial agents as the cause of diseases classified elsewhere: Secondary | ICD-10-CM | POA: Diagnosis not present

## 2024-07-28 DIAGNOSIS — O23593 Infection of other part of genital tract in pregnancy, third trimester: Secondary | ICD-10-CM | POA: Diagnosis not present

## 2024-07-28 DIAGNOSIS — A5402 Gonococcal vulvovaginitis, unspecified: Secondary | ICD-10-CM | POA: Diagnosis not present

## 2024-07-28 DIAGNOSIS — N760 Acute vaginitis: Secondary | ICD-10-CM | POA: Diagnosis present

## 2024-07-28 DIAGNOSIS — O98813 Other maternal infectious and parasitic diseases complicating pregnancy, third trimester: Secondary | ICD-10-CM | POA: Diagnosis not present

## 2024-07-28 DIAGNOSIS — B3731 Acute candidiasis of vulva and vagina: Secondary | ICD-10-CM | POA: Diagnosis present

## 2024-07-28 DIAGNOSIS — Z3A30 30 weeks gestation of pregnancy: Secondary | ICD-10-CM | POA: Diagnosis not present

## 2024-07-28 DIAGNOSIS — O98213 Gonorrhea complicating pregnancy, third trimester: Secondary | ICD-10-CM | POA: Diagnosis present

## 2024-07-28 DIAGNOSIS — O98219 Gonorrhea complicating pregnancy, unspecified trimester: Secondary | ICD-10-CM | POA: Insufficient documentation

## 2024-07-28 MED ORDER — TERCONAZOLE 0.4 % VA CREA: 1.0000 | TOPICAL_CREAM | Freq: Every day | VAGINAL | 0 refills | Status: DC

## 2024-07-28 MED ORDER — LIDOCAINE HCL (PF) 1 % IJ SOLN
INTRAMUSCULAR | Status: AC
  Administered 2024-07-28: 5 mL
  Filled 2024-07-28: qty 5

## 2024-07-28 MED ORDER — NYSTATIN-TRIAMCINOLONE 100000-0.1 UNIT/GM-% EX CREA: TOPICAL_CREAM | CUTANEOUS | 0 refills | Status: DC

## 2024-07-28 MED ORDER — CEFTAZIDIME 1 G IJ SOLR: 1.0000 g | Freq: Once | INTRAMUSCULAR | Status: DC

## 2024-07-28 MED ORDER — METRONIDAZOLE 500 MG PO TABS: 500.0000 mg | ORAL_TABLET | Freq: Two times a day (BID) | ORAL | 0 refills | Status: DC

## 2024-07-28 MED ORDER — CEFTRIAXONE SODIUM 1 G IJ SOLR
1.0000 g | Freq: Once | INTRAMUSCULAR | Status: AC
  Administered 2024-07-28: 1 g via INTRAMUSCULAR
  Filled 2024-07-28: qty 10

## 2024-07-28 NOTE — MAU Provider Note (Signed)
 History    Chief Complaint  Patient presents with   STD treatment   Gail Mathews , a  33 y.o. G1P0000 at [redacted]w[redacted]d presents to MAU without complaints. Patient reports that she tested positive for gonorrhea, yeast and BV and was told by the on call RN to come in for treatment. Patient denies vaginal bleeding, leaking of fluid, and contractions. She endorses positive fetal movement. She has no other complaints.          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    Past Surgical History:  Procedure Laterality Date   NO PAST SURGERIES      Family History  Problem Relation Age of Onset   Hypertension Mother    Asthma Mother    Allergic rhinitis Mother    Diabetes Father     Social History   Tobacco Use   Smoking status: Never   Smokeless tobacco: Never  Vaping Use   Vaping status: Never Used  Substance Use Topics   Alcohol use: No   Drug use: No    Allergies:  Allergies  Allergen Reactions   Other Swelling    Pecans/walnuts Pecans/walnuts   Amoxicillin Rash    Medications Prior to Admission  Medication Sig Dispense Refill Last Dose/Taking   albuterol  (VENTOLIN  HFA) 108 (90 Base) MCG/ACT inhaler INHALE 1-2 PUFFS INTO THE LUNGS EVERY 4 HOURS AS NEEDED FOR WHEEZING OR SHORTNESS OF BREATH. 18 each 0    aspirin  EC 81 MG tablet Take 1 tablet (81 mg total) by mouth at bedtime. For preeclampsia prevention 300 tablet 2    benzonatate  (TESSALON ) 100 MG capsule Take 1-2 caps PO TID PRN (Patient not taking: Reported on 06/10/2024) 20 capsule 0    Blood Pressure Monitoring (BLOOD PRESSURE KIT) DEVI 1 Device by Does not apply route once a week. 1 each 0    cetirizine  (ZYRTEC ) 10 MG tablet Take 1 tablet (10 mg total)  by mouth at bedtime. (Patient not taking: Reported on 06/10/2024) 90 tablet 0    cyclobenzaprine  (FLEXERIL ) 10 MG tablet Take 1 tablet (10 mg total) by mouth 2 (two) times daily as needed for muscle spasms. (Patient not taking: Reported on 06/10/2024) 20 tablet 0    DICLEGIS  10-10 MG TBEC Start with 1 tablet at bedtime, then increase to 2 tablets at bedtime on day 2 if nausea not controlled; Can increase to 1 tablet in the morning and 2 tablets in the evening if nausea not controlled 60 tablet 0    fluticasone  (FLONASE ) 50 MCG/ACT nasal spray Place 2 sprays into both nostrils daily. (Patient not taking: Reported on 06/10/2024) 16 g 6    ibuprofen  (ADVIL ) 800 MG tablet Take 1 tablet (800 mg total) by mouth every 8 (eight) hours as needed. (Patient not taking: Reported on 04/15/2024) 60 tablet 0    lidocaine  (LIDODERM ) 5 % Place 1 patch onto the skin daily. Remove & Discard patch within 12 hours or as directed by MD (Patient not  taking: Reported on 06/10/2024) 30 patch 0    montelukast  (SINGULAIR ) 10 MG tablet Take 1 tablet (10 mg total) by mouth daily. 90 tablet 0    ondansetron  (ZOFRAN -ODT) 4 MG disintegrating tablet Take 1 tablet (4 mg total) by mouth every 8 (eight) hours as needed. (Patient not taking: Reported on 06/10/2024) 20 tablet 0    Prenatal Vit-Fe Phos-FA-Omega (VITAFOL  GUMMIES) 3.33-0.333-34.8 MG CHEW Chew 1 tablet by mouth daily. 90 tablet 5     Review of Systems  Constitutional:  Negative for chills, fatigue and fever.  Eyes:  Negative for pain and visual disturbance.  Respiratory:  Negative for apnea, shortness of breath and wheezing.   Cardiovascular:  Negative for chest pain and palpitations.  Gastrointestinal:  Negative for abdominal pain, constipation, diarrhea, nausea and vomiting.  Genitourinary:  Negative for difficulty urinating, dysuria, pelvic pain, vaginal bleeding, vaginal discharge and vaginal pain.  Musculoskeletal:  Negative for back pain.  Neurological:  Negative for  seizures, weakness and headaches.  Psychiatric/Behavioral:  Negative for suicidal ideas.    Physical Exam Blood pressure 108/66, pulse (!) 104, temperature 98.1 F (36.7 C), temperature source Oral, resp. rate 20, height 5' 9 (1.753 m), weight 98.1 kg, last menstrual period 01/05/2024, SpO2 100%. Physical Exam Vitals and nursing note reviewed.  Constitutional:      General: She is not in acute distress.    Appearance: Normal appearance.  HENT:     Head: Normocephalic.  Pulmonary:     Effort: Pulmonary effort is normal.  Musculoskeletal:     Cervical back: Normal range of motion.  Skin:    General: Skin is warm and dry.  Neurological:     Mental Status: She is alert and oriented to person, place, and time.  Psychiatric:        Mood and Affect: Mood normal.    FHT obtained in triage   MAU Course Procedures Orders Placed This Encounter  Procedures   Discharge patient Discharge disposition: 01-Home or Self Care; Discharge patient date: 07/28/2024   Meds ordered this encounter  Medications   metroNIDAZOLE  (FLAGYL ) 500 MG tablet    Sig: Take 1 tablet (500 mg total) by mouth 2 (two) times daily.    Dispense:  14 tablet    Refill:  0    Supervising Provider:   PRATT, TANYA S [[id number]]   terconazole  (TERAZOL 7 ) 0.4 % vaginal cream    Sig: Place 1 applicator vaginally at bedtime. Use for seven days    Dispense:  45 g    Refill:  0    Supervising Provider:   PRATT, TANYA S [[id number]]   nystatin -triamcinolone  (MYCOLOG II) cream    Sig: Apply to affected area daily    Dispense:  15 g    Refill:  0    Supervising Provider:   PRATT, TANYA S [[id number]]   DISCONTD: cefTAZidime  (FORTAZ ) injection 1 g    Antibiotic Indication::   Other Indication (list below)    Other Indication::   Gonorrhea   cefTRIAXone  (ROCEPHIN ) injection 1 g    Antibiotic Indication::   STD    MDM - CNM reviewed STD screening that was completed on 07/21/24. Patient positive for gonorrhea, bacterial vaginosis and  yeast.  - Plan to treat for Gonorrhea prior to discharge, other medication sent to outpatient pharmacy for pick up. - plan for discharge.   1. Yeast infection   2. Gonorrhea affecting pregnancy in third trimester   3. Bacterial vaginosis   4. [redacted] weeks  gestation of pregnancy    - Offered patient the option to have treatment completed in outpatient setting and patient declined.  - Rocephin  given in MAU prior to discharge. Recommended that patient notify partner and partner needs to also be treated.  - Rx for Yeast and BV sent to outpatient pharmacy for pick up.  - Reviewed worsening signs and return precautions.  - Patient discharged home in stable condition and may return to MAU as needed.    Sydna Brodowski Erven) Emilio, MSN, CNM  [facility]  07/28/2024 12:38 AM

## 2024-07-30 ENCOUNTER — Other Ambulatory Visit: Payer: Self-pay | Admitting: Obstetrics and Gynecology

## 2024-07-30 DIAGNOSIS — J4541 Moderate persistent asthma with (acute) exacerbation: Secondary | ICD-10-CM

## 2024-07-31 ENCOUNTER — Encounter: Payer: Self-pay | Admitting: Physician Assistant

## 2024-08-02 ENCOUNTER — Other Ambulatory Visit: Payer: Self-pay

## 2024-08-02 ENCOUNTER — Encounter: Payer: Self-pay | Admitting: Obstetrics and Gynecology

## 2024-08-02 ENCOUNTER — Other Ambulatory Visit: Payer: Self-pay | Admitting: Certified Nurse Midwife

## 2024-08-02 DIAGNOSIS — J4541 Moderate persistent asthma with (acute) exacerbation: Secondary | ICD-10-CM

## 2024-08-02 MED ORDER — ALBUTEROL SULFATE HFA 108 (90 BASE) MCG/ACT IN AERS: 1.0000 | INHALATION_SPRAY | RESPIRATORY_TRACT | 0 refills | Status: DC | PRN

## 2024-08-09 ENCOUNTER — Ambulatory Visit: Attending: Obstetrics and Gynecology

## 2024-08-09 ENCOUNTER — Ambulatory Visit: Admitting: Obstetrics and Gynecology

## 2024-08-09 ENCOUNTER — Other Ambulatory Visit: Payer: Self-pay | Admitting: *Deleted

## 2024-08-09 ENCOUNTER — Ambulatory Visit

## 2024-08-09 ENCOUNTER — Encounter: Payer: Self-pay | Admitting: Obstetrics and Gynecology

## 2024-08-09 VITALS — BP 122/72 | HR 97 | Wt 219.6 lb

## 2024-08-09 VITALS — BP 110/72 | HR 110

## 2024-08-09 DIAGNOSIS — O99210 Obesity complicating pregnancy, unspecified trimester: Secondary | ICD-10-CM

## 2024-08-09 DIAGNOSIS — Z3480 Encounter for supervision of other normal pregnancy, unspecified trimester: Secondary | ICD-10-CM

## 2024-08-09 DIAGNOSIS — O10919 Unspecified pre-existing hypertension complicating pregnancy, unspecified trimester: Secondary | ICD-10-CM

## 2024-08-09 DIAGNOSIS — O99213 Obesity complicating pregnancy, third trimester: Secondary | ICD-10-CM

## 2024-08-09 DIAGNOSIS — Z3A32 32 weeks gestation of pregnancy: Secondary | ICD-10-CM

## 2024-08-09 DIAGNOSIS — O10913 Unspecified pre-existing hypertension complicating pregnancy, third trimester: Secondary | ICD-10-CM

## 2024-08-09 DIAGNOSIS — O98219 Gonorrhea complicating pregnancy, unspecified trimester: Secondary | ICD-10-CM

## 2024-08-09 NOTE — Progress Notes (Signed)
 Pt presents for ROB visit. No concerns

## 2024-08-09 NOTE — Progress Notes (Signed)
   PRENATAL VISIT NOTE  Subjective:  Gail Mathews is a 33 y.o. G1P0000 at [redacted]w[redacted]d being seen today for ongoing prenatal care.  She is currently monitored for the following issues for this high-risk pregnancy and has Asthma; Supervision of other normal pregnancy, antepartum; Alpha thalassemia silent carrier; Obesity affecting pregnancy, antepartum; Chronic hypertension affecting pregnancy; and Gonorrhea affecting pregnancy on their problem list.  Patient reports no complaints.  Contractions: Not present. Vag. Bleeding: None.  Movement: Present. Denies leaking of fluid.   The following portions of the patient's history were reviewed and updated as appropriate: allergies, current medications, past family history, past medical history, past social history, past surgical history and problem list.   Objective:   Vitals:   08/09/24 1338  BP: 122/72  Pulse: 97  Weight: 219 lb 9.6 oz (99.6 kg)    Fetal Status:  Fetal Heart Rate (bpm): 150   Movement: Present    General: Alert, oriented and cooperative. Patient is in no acute distress.  Skin: Skin is warm and dry. No rash noted.   Cardiovascular: Normal heart rate noted  Respiratory: Normal respiratory effort, no problems with respiration noted  Abdomen: Soft, gravid, appropriate for gestational age.  Pain/Pressure: Absent     Pelvic: Cervical exam deferred        Extremities: Normal range of motion.  Edema: None  Mental Status: Normal mood and affect. Normal behavior. Normal judgment and thought content.   Assessment and Plan:  Pregnancy: G1P0000 at [redacted]w[redacted]d 1. Supervision of other normal pregnancy, antepartum (Primary) BP and FHR normal Doing well, feeling regular movement    2. Chronic hypertension affecting pregnancy Normotensive Continue ASA Had follow up u/s today   3. [redacted] weeks gestation of pregnancy   4. Gonorrhea affecting pregnancy, antepartum Tx in MAU 11/26 plan swab end of December    Preterm labor symptoms and  general obstetric precautions including but not limited to vaginal bleeding, contractions, leaking of fluid and fetal movement were reviewed in detail with the patient. Please refer to After Visit Summary for other counseling recommendations.    Nidia Daring, FNP

## 2024-08-09 NOTE — Progress Notes (Signed)
 MFM Consult Note  Gail Mathews is currently at [redacted]w[redacted]d. She has been followed due to maternal obesity and chronic hypertension that is not treated with any medications.    She denies any problems since her last exam and has screened negative for gestational diabetes in her current pregnancy.    Her blood pressure today was 110/72.  Sonographic findings Single intrauterine pregnancy at 32w 4d.  Fetal cardiac activity:  Observed and appears normal. Presentation: Cephalic. Fetal biometry shows the estimated fetal weight of 4 lb 1 oz,  1837g (19%). Amniotic fluid volume: Within normal limits. AFI: 11.33cm.  MVP: 4.07 cm. Placenta: Posterior.  Due to chronic hypertension, a follow-up growth scan was scheduled in 4 weeks.    Should her blood pressures be elevated later in her pregnancy, delivery should be considered at around 39 weeks.  Preeclampsia precautions were reviewed today.  The patient stated that all of her questions were answered.   A total of 20 minutes was spent counseling and coordinating the care for this patient.  Greater than 50% of the time was spent in direct face-to-face contact.

## 2024-08-21 ENCOUNTER — Telehealth: Payer: Self-pay

## 2024-08-21 DIAGNOSIS — R051 Acute cough: Secondary | ICD-10-CM

## 2024-08-21 MED ORDER — GUAIFENESIN 200 MG PO TABS: 200.0000 mg | ORAL_TABLET | ORAL | 0 refills | Status: AC | PRN

## 2024-08-21 NOTE — Progress Notes (Signed)
 We are sorry that you are not feeling well.  Here is how we plan to help!  Based on your presentation I believe you most likely have A cough due to a virus.  This is called viral bronchitis and is best treated by rest, plenty of fluids and control of the cough.  You may use Ibuprofen  or Tylenol  as directed to help your symptoms.     In addition you may use solely Guaifenesin  cough medicine.  I have sent a prescription, however, it is available over the counter.  Please also consult your OBGYN regarding your cough symptoms.     From your responses in the eVisit questionnaire you describe inflammation in the upper respiratory tract which is causing a significant cough.  This is commonly called Bronchitis and has four common causes:   Allergies Viral Infections Acid Reflux Bacterial Infection Allergies, viruses and acid reflux are treated by controlling symptoms or eliminating the cause. An example might be a cough caused by taking certain blood pressure medications. You stop the cough by changing the medication. Another example might be a cough caused by acid reflux. Controlling the reflux helps control the cough.  USE OF BRONCHODILATOR (RESCUE) INHALERS: There is a risk from using your bronchodilator too frequently.  The risk is that over-reliance on a medication which only relaxes the muscles surrounding the breathing tubes can reduce the effectiveness of medications prescribed to reduce swelling and congestion of the tubes themselves.  Although you feel brief relief from the bronchodilator inhaler, your asthma may actually be worsening with the tubes becoming more swollen and filled with mucus.  This can delay other crucial treatments, such as oral steroid medications. If you need to use a bronchodilator inhaler daily, several times per day, you should discuss this with your provider.  There are probably better treatments that could be used to keep your asthma under control.     HOME CARE Only  take medications as instructed by your medical team. Complete the entire course of an antibiotic. Drink plenty of fluids and get plenty of rest. Avoid close contacts especially the very young and the elderly Cover your mouth if you cough or cough into your sleeve. Always remember to wash your hands A steam or ultrasonic humidifier can help congestion.   GET HELP RIGHT AWAY IF: You develop worsening fever. You become short of breath You cough up blood. Your symptoms persist after you have completed your treatment plan MAKE SURE YOU  Understand these instructions. Will watch your condition. Will get help right away if you are not doing well or get worse.  Your e-visit answers were reviewed by a board certified advanced clinical practitioner to complete your personal care plan.  Depending on the condition, your plan could have included both over the counter or prescription medications. If there is a problem please reply  once you have received a response from your provider. Your safety is important to us .  If you have drug allergies check your prescription carefully.    You can use MyChart to ask questions about todays visit, request a non-urgent call back, or ask for a work or school excuse for 24 hours related to this e-Visit. If it has been greater than 24 hours you will need to follow up with your provider, or enter a new e-Visit to address those concerns. You will get an e-mail in the next two days asking about your experience.  I hope that your e-visit has been valuable and will speed  your recovery. Thank you for using e-visits.   I have spent 5 minutes in review of e-visit questionnaire, review and updating patient chart, medical decision making and response to patient.   Roosvelt Mater, PA-C

## 2024-08-23 ENCOUNTER — Ambulatory Visit: Admitting: Obstetrics

## 2024-08-23 VITALS — BP 128/71 | HR 113 | Wt 215.2 lb

## 2024-08-23 DIAGNOSIS — O10913 Unspecified pre-existing hypertension complicating pregnancy, third trimester: Secondary | ICD-10-CM

## 2024-08-23 DIAGNOSIS — D563 Thalassemia minor: Secondary | ICD-10-CM

## 2024-08-23 DIAGNOSIS — O99210 Obesity complicating pregnancy, unspecified trimester: Secondary | ICD-10-CM | POA: Diagnosis not present

## 2024-08-23 DIAGNOSIS — O98213 Gonorrhea complicating pregnancy, third trimester: Secondary | ICD-10-CM | POA: Diagnosis not present

## 2024-08-23 DIAGNOSIS — Z3480 Encounter for supervision of other normal pregnancy, unspecified trimester: Secondary | ICD-10-CM

## 2024-08-23 DIAGNOSIS — O10919 Unspecified pre-existing hypertension complicating pregnancy, unspecified trimester: Secondary | ICD-10-CM

## 2024-08-23 DIAGNOSIS — Z3A34 34 weeks gestation of pregnancy: Secondary | ICD-10-CM

## 2024-08-23 NOTE — Progress Notes (Signed)
 Subjective:  Gail Mathews is a 33 y.o. G1P0000 at [redacted]w[redacted]d being seen today for ongoing prenatal care.  She is currently monitored for the following issues for this low-risk pregnancy and has Asthma; Supervision of other normal pregnancy, antepartum; Alpha thalassemia silent carrier; Obesity affecting pregnancy, antepartum; Chronic hypertension affecting pregnancy; and Gonorrhea affecting pregnancy on their problem list.  Patient reports no complaints.  Contractions: Not present. Vag. Bleeding: None.  Movement: Present. Denies leaking of fluid.   The following portions of the patient's history were reviewed and updated as appropriate: allergies, current medications, past family history, past medical history, past social history, past surgical history and problem list. Problem list updated.  Objective:   Vitals:   08/23/24 1610  BP: 128/71  Pulse: (!) 113  Weight: 215 lb 3.2 oz (97.6 kg)    Fetal Status:     Movement: Present     General:  Alert, oriented and cooperative. Patient is in no acute distress.  Skin: Skin is warm and dry. No rash noted.   Cardiovascular: Normal heart rate noted  Respiratory: Normal respiratory effort, no problems with respiration noted  Abdomen: Soft, gravid, appropriate for gestational age. Pain/Pressure: Absent     Pelvic:  Cervical exam deferred        Extremities: Normal range of motion.  Edema: None  Mental Status: Normal mood and affect. Normal behavior. Normal judgment and thought content.   Urinalysis:      Assessment and Plan:  Pregnancy: G1P0000 at [redacted]w[redacted]d  1. Supervision of other normal pregnancy, antepartum (Primary)  2. Chronic hypertension affecting pregnancy - taking Baby ASA  3. Alpha thalassemia silent carrier  4. Gonorrhea affecting pregnancy in third trimester, treated on 11/26  5. Obesity affecting pregnancy, antepartum, unspecified obesity type   Preterm labor symptoms and general obstetric precautions including but not limited  to vaginal bleeding, contractions, leaking of fluid and fetal movement were reviewed in detail with the patient. Please refer to After Visit Summary for other counseling recommendations.   Return in about 2 weeks (around 09/06/2024) for ROB.   Rudy Carlin LABOR, MD 08/23/2024

## 2024-08-24 ENCOUNTER — Encounter: Payer: Self-pay | Admitting: Obstetrics and Gynecology

## 2024-08-30 ENCOUNTER — Other Ambulatory Visit: Payer: Self-pay | Admitting: Obstetrics and Gynecology

## 2024-08-30 DIAGNOSIS — J4541 Moderate persistent asthma with (acute) exacerbation: Secondary | ICD-10-CM

## 2024-09-02 NOTE — L&D Delivery Note (Cosign Needed)
 OB/GYN Faculty Practice Delivery Note  Gail Mathews is a 34 y.o. G1P1001 s/p SVD at [redacted]w[redacted]d. She was admitted for SROM in active labor.   ROM: 2h 7m with clear fluid GBS Status: negative Maximum Maternal Temperature: 98.4  Labor Progress: Gail Mathews presented with SROM to clear fluid in active labor, received IV pain medications progressed to complete pushed  for approx 20 minutes and delivered a viable Gail Mathews).   Delivery Date/Time: 09/25/24 @ 0041 Delivery: Called to room and patient was complete and pushing. Head delivered ROA over intact perineum. No nuchal cord present. Shoulder and body delivered in usual fashion. Infant with spontaneous cry, placed on mother's abdomen, dried and stimulated. Cord clamped x 2 after 1-minute delay, and cut by maternal grandmother. Cord blood drawn. Placenta delivered spontaneously with gentle cord traction. Fundus firm with massage and Pitocin . Labia, perineum, vagina, and cervix inspected inspected with repair of bilateral labia.   Placenta: Delivered via Gail Mathews, intact, with 3 VC Complications: None Lacerations: Bilateral labial EBL: 250 cc Analgesia: Local for repair  Postpartum Planning [ ]  message to sent to schedule follow-up  [ ]  vaccines UTD  Infant: Gail Mathews)  APGARs 9/9  3030g  Gail Mathews, CNM  10/01/2024 6:02 PM

## 2024-09-04 ENCOUNTER — Telehealth: Payer: Self-pay

## 2024-09-06 ENCOUNTER — Encounter: Payer: Self-pay | Admitting: Obstetrics and Gynecology

## 2024-09-06 ENCOUNTER — Other Ambulatory Visit (HOSPITAL_COMMUNITY)
Admission: RE | Admit: 2024-09-06 | Discharge: 2024-09-06 | Disposition: A | Discharge status: Home / Self Care | Source: Ambulatory Visit | Attending: Obstetrics and Gynecology | Admitting: Obstetrics and Gynecology

## 2024-09-06 VITALS — BP 110/74 | HR 89 | Wt 219.0 lb

## 2024-09-06 DIAGNOSIS — Z3A36 36 weeks gestation of pregnancy: Secondary | ICD-10-CM | POA: Diagnosis not present

## 2024-09-06 DIAGNOSIS — O99210 Obesity complicating pregnancy, unspecified trimester: Secondary | ICD-10-CM

## 2024-09-06 DIAGNOSIS — O10919 Unspecified pre-existing hypertension complicating pregnancy, unspecified trimester: Secondary | ICD-10-CM

## 2024-09-06 DIAGNOSIS — O98213 Gonorrhea complicating pregnancy, third trimester: Secondary | ICD-10-CM | POA: Diagnosis not present

## 2024-09-06 DIAGNOSIS — Z3480 Encounter for supervision of other normal pregnancy, unspecified trimester: Secondary | ICD-10-CM | POA: Diagnosis present

## 2024-09-06 DIAGNOSIS — Z3483 Encounter for supervision of other normal pregnancy, third trimester: Secondary | ICD-10-CM | POA: Insufficient documentation

## 2024-09-06 DIAGNOSIS — O10913 Unspecified pre-existing hypertension complicating pregnancy, third trimester: Secondary | ICD-10-CM

## 2024-09-06 DIAGNOSIS — D563 Thalassemia minor: Secondary | ICD-10-CM

## 2024-09-06 DIAGNOSIS — Z3403 Encounter for supervision of normal first pregnancy, third trimester: Secondary | ICD-10-CM | POA: Diagnosis not present

## 2024-09-06 DIAGNOSIS — O99213 Obesity complicating pregnancy, third trimester: Secondary | ICD-10-CM

## 2024-09-06 NOTE — Progress Notes (Signed)
ROB GBS 

## 2024-09-06 NOTE — Progress Notes (Signed)
" ° °  PRENATAL VISIT NOTE  Subjective:  Gail Mathews is a 34 y.o. G1P0000 at [redacted]w[redacted]d being seen today for ongoing prenatal care.  She is currently monitored for the following issues for this high-risk pregnancy and has Asthma; Supervision of other normal pregnancy, antepartum; Alpha thalassemia silent carrier; Obesity affecting pregnancy, antepartum; Chronic hypertension affecting pregnancy; and Gonorrhea affecting pregnancy on their problem list.  Patient reports no complaints.  Contractions: Not present. Vag. Bleeding: None.  Movement: Present. Denies leaking of fluid.   The following portions of the patient's history were reviewed and updated as appropriate: allergies, current medications, past family history, past medical history, past social history, past surgical history and problem list.   Objective:   Vitals:   09/06/24 1504  BP: 110/74  Pulse: 89  Weight: 219 lb (99.3 kg)    Fetal Status:  Fetal Heart Rate (bpm): 149 Fundal Height: 36 cm Movement: Present    General: Alert, oriented and cooperative. Patient is in no acute distress.  Skin: Skin is warm and dry. No rash noted.   Cardiovascular: Normal heart rate noted  Respiratory: Normal respiratory effort, no problems with respiration noted  Abdomen: Soft, gravid, appropriate for gestational age.  Pain/Pressure: Absent     Pelvic: Cervical exam performed in the presence of a chaperone Dilation: 1.5 Effacement (%): 50 Station: -3  Extremities: Normal range of motion.  Edema: None  Mental Status: Normal mood and affect. Normal behavior. Normal judgment and thought content.      03/18/2024    9:45 AM  Depression screen PHQ 2/9  Decreased Interest 0  Down, Depressed, Hopeless 0  PHQ - 2 Score 0  Altered sleeping 0  Tired, decreased energy 0  Change in appetite 0  Feeling bad or failure about yourself  0  Trouble concentrating 0  Moving slowly or fidgety/restless 0  Suicidal thoughts 0  PHQ-9 Score 0      Data saved  with a previous flowsheet row definition        03/18/2024    9:45 AM  GAD 7 : Generalized Anxiety Score  Nervous, Anxious, on Edge 0  Control/stop worrying 0  Worry too much - different things 0  Trouble relaxing 0  Restless 0  Easily annoyed or irritable 0  Afraid - awful might happen 0  Total GAD 7 Score 0    Assessment and Plan:  Pregnancy: G1P0000 at [redacted]w[redacted]d 1. Supervision of other normal pregnancy, antepartum (Primary) Patient is doing well  Cultures today Patient plans depo-provera  2. [redacted] weeks gestation of pregnancy   3. Chronic hypertension affecting pregnancy Normotensive without medication Continue ASA Follow up growth US  tomorrow  4. Obesity affecting pregnancy, antepartum, unspecified obesity type   5. Gonorrhea affecting pregnancy in third trimester TOC today  6. Alpha thalassemia silent carrier   Preterm labor symptoms and general obstetric precautions including but not limited to vaginal bleeding, contractions, leaking of fluid and fetal movement were reviewed in detail with the patient. Please refer to After Visit Summary for other counseling recommendations.   Return in about 1 week (around 09/13/2024) for in person, ROB, High risk.  Future Appointments  Date Time Provider Department Center  09/07/2024  7:15 AM [facility] PROVIDER 1 [facility]  09/07/2024  7:30 AM [facility] US3 [facility]    Winton Felt, MD  "

## 2024-09-07 ENCOUNTER — Ambulatory Visit (HOSPITAL_BASED_OUTPATIENT_CLINIC_OR_DEPARTMENT_OTHER): Admitting: Obstetrics

## 2024-09-07 ENCOUNTER — Encounter: Payer: Self-pay | Admitting: Obstetrics and Gynecology

## 2024-09-07 ENCOUNTER — Ambulatory Visit: Attending: Obstetrics and Gynecology

## 2024-09-07 ENCOUNTER — Encounter: Payer: Self-pay | Admitting: *Deleted

## 2024-09-07 VITALS — BP 121/87

## 2024-09-07 DIAGNOSIS — O10919 Unspecified pre-existing hypertension complicating pregnancy, unspecified trimester: Secondary | ICD-10-CM | POA: Insufficient documentation

## 2024-09-07 DIAGNOSIS — Z3480 Encounter for supervision of other normal pregnancy, unspecified trimester: Secondary | ICD-10-CM | POA: Insufficient documentation

## 2024-09-07 DIAGNOSIS — O99210 Obesity complicating pregnancy, unspecified trimester: Secondary | ICD-10-CM | POA: Diagnosis present

## 2024-09-07 DIAGNOSIS — Z3A36 36 weeks gestation of pregnancy: Secondary | ICD-10-CM

## 2024-09-07 DIAGNOSIS — O10913 Unspecified pre-existing hypertension complicating pregnancy, third trimester: Secondary | ICD-10-CM

## 2024-09-07 DIAGNOSIS — Z362 Encounter for other antenatal screening follow-up: Secondary | ICD-10-CM | POA: Diagnosis not present

## 2024-09-07 DIAGNOSIS — D563 Thalassemia minor: Secondary | ICD-10-CM

## 2024-09-07 DIAGNOSIS — Z148 Genetic carrier of other disease: Secondary | ICD-10-CM | POA: Insufficient documentation

## 2024-09-07 DIAGNOSIS — O99013 Anemia complicating pregnancy, third trimester: Secondary | ICD-10-CM | POA: Diagnosis not present

## 2024-09-07 DIAGNOSIS — E669 Obesity, unspecified: Secondary | ICD-10-CM | POA: Diagnosis not present

## 2024-09-07 DIAGNOSIS — O99213 Obesity complicating pregnancy, third trimester: Secondary | ICD-10-CM | POA: Insufficient documentation

## 2024-09-07 DIAGNOSIS — O10013 Pre-existing essential hypertension complicating pregnancy, third trimester: Secondary | ICD-10-CM | POA: Insufficient documentation

## 2024-09-07 LAB — CERVICOVAGINAL ANCILLARY ONLY
Chlamydia: NEGATIVE
Comment: NEGATIVE
Comment: NEGATIVE
Comment: NORMAL
Neisseria Gonorrhea: NEGATIVE
Trichomonas: NEGATIVE

## 2024-09-07 NOTE — Progress Notes (Signed)
 MFM Consult Note  Shanaiya Bene is currently at [redacted]w[redacted]d. She has been followed due to maternal obesity and chronic hypertension that is not treated with any medications.    She denies any problems since her last exam.  Her blood pressure today was 121/87.SABRA  Sonographic findings Single intrauterine pregnancy at 36w 5d. Fetal cardiac activity: Observed. Presentation: Cephalic. Fetal biometry shows the estimated fetal weight of 5 lb 11 oz,  2581g (15%). Amniotic fluid: Within normal limits. AFI: 17.34 cm.  MVP: 5.01 cm. Placenta: Posterior. BPP: 8/8.   As her blood pressures are within normal limits and the fetal growth is within normal limits, delivery should be considered at between 39 to 40 weeks.    Preeclampsia precautions were reviewed today.  No further exams were scheduled in our office.  The patient stated that all of her questions were answered.   A total of 20 minutes was spent counseling and coordinating the care for this patient.  Greater than 50% of the time was spent in direct face-to-face contact.

## 2024-09-10 LAB — CULTURE, BETA STREP (GROUP B ONLY): Strep Gp B Culture: NEGATIVE

## 2024-09-17 ENCOUNTER — Ambulatory Visit (INDEPENDENT_AMBULATORY_CARE_PROVIDER_SITE_OTHER): Payer: Self-pay | Admitting: Obstetrics and Gynecology

## 2024-09-17 VITALS — BP 121/75 | HR 90 | Wt 218.0 lb

## 2024-09-17 DIAGNOSIS — Z3480 Encounter for supervision of other normal pregnancy, unspecified trimester: Secondary | ICD-10-CM

## 2024-09-17 DIAGNOSIS — Z3A38 38 weeks gestation of pregnancy: Secondary | ICD-10-CM | POA: Diagnosis not present

## 2024-09-17 DIAGNOSIS — O10913 Unspecified pre-existing hypertension complicating pregnancy, third trimester: Secondary | ICD-10-CM | POA: Diagnosis not present

## 2024-09-17 DIAGNOSIS — O10919 Unspecified pre-existing hypertension complicating pregnancy, unspecified trimester: Secondary | ICD-10-CM

## 2024-09-17 DIAGNOSIS — D563 Thalassemia minor: Secondary | ICD-10-CM | POA: Diagnosis not present

## 2024-09-17 NOTE — Progress Notes (Signed)
 Pt presents for ROB visit. Requesting cervical check

## 2024-09-17 NOTE — Progress Notes (Signed)
" ° °  PRENATAL VISIT NOTE  Subjective:  Gail Mathews is a 34 y.o. G1P0000 at [redacted]w[redacted]d being seen today for ongoing prenatal care.  She is currently monitored for the following issues for this low-risk pregnancy and has Asthma; Supervision of other normal pregnancy, antepartum; Alpha thalassemia silent carrier; Obesity affecting pregnancy, antepartum; Chronic hypertension affecting pregnancy; and Gonorrhea affecting pregnancy on their problem list.  Patient reports no complaints.  Contractions: Irritability. Vag. Bleeding: None.  Movement: Present. Denies leaking of fluid.   The following portions of the patient's history were reviewed and updated as appropriate: allergies, current medications, past family history, past medical history, past social history, past surgical history and problem list.   Objective:   Vitals:   09/17/24 1032  BP: 121/75  Pulse: 90  Weight: 218 lb (98.9 kg)    Fetal Status:  Fetal Heart Rate (bpm): 151   Movement: Present Presentation: Vertex  General: Alert, oriented and cooperative. Patient is in no acute distress.  Skin: Skin is warm and dry. No rash noted.   Cardiovascular: Normal heart rate noted  Respiratory: Normal respiratory effort, no problems with respiration noted  Abdomen: Soft, gravid, appropriate for gestational age.  Pain/Pressure: Present     Pelvic: Cervical exam performed in the presence of a chaperone Dilation: 1.5 Effacement (%): 60 Station: -3  Extremities: Normal range of motion.  Edema: Trace  Mental Status: Normal mood and affect. Normal behavior. Normal judgment and thought content.     Assessment and Plan:  Pregnancy: G1P0000 at 105w1d 1. Supervision of other normal pregnancy, antepartum (Primary) BP and FHR normal Doing well, feeling regular movement    2. Chronic hypertension affecting pregnancy 1/6 BPP 8/8, afi normal, EFW 15% IOL 39-40 per mfm scheduled today  No s&s, normotensive  3. Alpha thalassemia silent  carrier   4. [redacted] weeks gestation of pregnancy Labor precautions   Term labor symptoms and general obstetric precautions including but not limited to vaginal bleeding, contractions, leaking of fluid and fetal movement were reviewed in detail with the patient. Please refer to After Visit Summary for other counseling recommendations.   Return in about 1 week (around 09/24/2024) for OB VISIT (MD or APP).  Future Appointments  Date Time Provider Department Center  09/22/2024  9:55 AM Zina Jerilynn LABOR, MD [facility] None  09/25/2024  7:15 AM [facility] SCHED ROOM [facility] None     Nidia Daring, FNP  "

## 2024-09-20 ENCOUNTER — Telehealth (HOSPITAL_COMMUNITY): Payer: Self-pay | Admitting: *Deleted

## 2024-09-20 ENCOUNTER — Encounter (HOSPITAL_COMMUNITY): Payer: Self-pay | Admitting: *Deleted

## 2024-09-20 NOTE — Telephone Encounter (Signed)
 Preadmission screen

## 2024-09-22 ENCOUNTER — Ambulatory Visit (INDEPENDENT_AMBULATORY_CARE_PROVIDER_SITE_OTHER): Payer: Self-pay | Admitting: Obstetrics and Gynecology

## 2024-09-22 VITALS — BP 138/83 | HR 87 | Wt 220.3 lb

## 2024-09-22 DIAGNOSIS — Z3480 Encounter for supervision of other normal pregnancy, unspecified trimester: Secondary | ICD-10-CM

## 2024-09-22 DIAGNOSIS — O99210 Obesity complicating pregnancy, unspecified trimester: Secondary | ICD-10-CM

## 2024-09-22 DIAGNOSIS — Z3A38 38 weeks gestation of pregnancy: Secondary | ICD-10-CM

## 2024-09-22 DIAGNOSIS — D563 Thalassemia minor: Secondary | ICD-10-CM

## 2024-09-22 DIAGNOSIS — O10913 Unspecified pre-existing hypertension complicating pregnancy, third trimester: Secondary | ICD-10-CM | POA: Diagnosis not present

## 2024-09-22 DIAGNOSIS — O10919 Unspecified pre-existing hypertension complicating pregnancy, unspecified trimester: Secondary | ICD-10-CM

## 2024-09-22 DIAGNOSIS — O99213 Obesity complicating pregnancy, third trimester: Secondary | ICD-10-CM | POA: Diagnosis not present

## 2024-09-22 NOTE — Progress Notes (Signed)
 Pt presents for rob. Pt has no questions or concerns at this time.

## 2024-09-22 NOTE — Progress Notes (Signed)
" ° °  PRENATAL VISIT NOTE  Subjective:  Gail Mathews is a 34 y.o. G1P0000 at [redacted]w[redacted]d being seen today for ongoing prenatal care.  She is currently monitored for the following issues for this high-risk pregnancy and has Asthma; Supervision of other normal pregnancy, antepartum; Alpha thalassemia silent carrier; Obesity affecting pregnancy, antepartum; Chronic hypertension affecting pregnancy; and Gonorrhea affecting pregnancy on their problem list.  Patient doing well with no acute concerns today. She reports no complaints.  Contractions: Not present. Vag. Bleeding: None.  Movement: Present. Denies leaking of fluid.   The following portions of the patient's history were reviewed and updated as appropriate: allergies, current medications, past family history, past medical history, past social history, past surgical history and problem list. Problem list updated.  Objective:   Vitals:   09/22/24 1010  BP: 138/83  Pulse: 87  Weight: 220 lb 4.8 oz (99.9 kg)    Fetal Status: Fetal Heart Rate (bpm): 166 Fundal Height: 39 cm Movement: Present     General:  Alert, oriented and cooperative. Patient is in no acute distress.  Skin: Skin is warm and dry. No rash noted.   Cardiovascular: Normal heart rate noted  Respiratory: Normal respiratory effort, no problems with respiration noted  Abdomen: Soft, gravid, appropriate for gestational age.  Pain/Pressure: Present     Pelvic: Cervical exam deferred        Extremities: Normal range of motion.  Edema: None  Mental Status:  Normal mood and affect. Normal behavior. Normal judgment and thought content.   Assessment and Plan:  Pregnancy: G1P0000 at [redacted]w[redacted]d  1. Chronic hypertension affecting pregnancy (Primary) BP controlled without meds  2. Supervision of other normal pregnancy, antepartum Continue routine prenatal care IOL scheduled for 09/25/24  3. Obesity affecting pregnancy, antepartum, unspecified obesity type   4. Alpha thalassemia silent  carrier   Term labor symptoms and general obstetric precautions including but not limited to vaginal bleeding, contractions, leaking of fluid and fetal movement were reviewed in detail with the patient.  Please refer to After Visit Summary for other counseling recommendations.   No follow-ups on file.   Jerilynn Buddle, MD Faculty Attending [facility]   "

## 2024-09-24 ENCOUNTER — Other Ambulatory Visit: Payer: Self-pay

## 2024-09-24 ENCOUNTER — Encounter: Payer: Self-pay | Admitting: Obstetrics and Gynecology

## 2024-09-24 ENCOUNTER — Encounter (HOSPITAL_COMMUNITY): Payer: Self-pay | Admitting: Obstetrics and Gynecology

## 2024-09-24 ENCOUNTER — Inpatient Hospital Stay (HOSPITAL_COMMUNITY)
Admission: AD | Admit: 2024-09-24 | Discharge: 2024-09-26 | DRG: 807 | Disposition: A | Discharge status: Home / Self Care | Attending: Obstetrics and Gynecology | Admitting: Obstetrics and Gynecology

## 2024-09-24 DIAGNOSIS — Z79899 Other long term (current) drug therapy: Secondary | ICD-10-CM

## 2024-09-24 DIAGNOSIS — Z825 Family history of asthma and other chronic lower respiratory diseases: Secondary | ICD-10-CM | POA: Diagnosis not present

## 2024-09-24 DIAGNOSIS — Z3480 Encounter for supervision of other normal pregnancy, unspecified trimester: Principal | ICD-10-CM

## 2024-09-24 DIAGNOSIS — F411 Generalized anxiety disorder: Secondary | ICD-10-CM | POA: Diagnosis present

## 2024-09-24 DIAGNOSIS — Z833 Family history of diabetes mellitus: Secondary | ICD-10-CM

## 2024-09-24 DIAGNOSIS — Z148 Genetic carrier of other disease: Secondary | ICD-10-CM

## 2024-09-24 DIAGNOSIS — J45909 Unspecified asthma, uncomplicated: Secondary | ICD-10-CM | POA: Diagnosis present

## 2024-09-24 DIAGNOSIS — O1092 Unspecified pre-existing hypertension complicating childbirth: Principal | ICD-10-CM | POA: Diagnosis present

## 2024-09-24 DIAGNOSIS — O9952 Diseases of the respiratory system complicating childbirth: Secondary | ICD-10-CM | POA: Diagnosis present

## 2024-09-24 DIAGNOSIS — O99214 Obesity complicating childbirth: Secondary | ICD-10-CM | POA: Diagnosis present

## 2024-09-24 DIAGNOSIS — O26893 Other specified pregnancy related conditions, third trimester: Secondary | ICD-10-CM | POA: Diagnosis present

## 2024-09-24 DIAGNOSIS — Z9189 Other specified personal risk factors, not elsewhere classified: Secondary | ICD-10-CM | POA: Insufficient documentation

## 2024-09-24 DIAGNOSIS — O10919 Unspecified pre-existing hypertension complicating pregnancy, unspecified trimester: Secondary | ICD-10-CM | POA: Diagnosis present

## 2024-09-24 DIAGNOSIS — J3489 Other specified disorders of nose and nasal sinuses: Secondary | ICD-10-CM

## 2024-09-24 DIAGNOSIS — Z3A39 39 weeks gestation of pregnancy: Secondary | ICD-10-CM | POA: Diagnosis not present

## 2024-09-24 DIAGNOSIS — Z8249 Family history of ischemic heart disease and other diseases of the circulatory system: Secondary | ICD-10-CM | POA: Diagnosis not present

## 2024-09-24 LAB — CBC
HCT: 35.5 % — ABNORMAL LOW (ref 36.0–46.0)
Hemoglobin: 11.5 g/dL — ABNORMAL LOW (ref 12.0–15.0)
MCH: 26.1 pg (ref 26.0–34.0)
MCHC: 32.4 g/dL (ref 30.0–36.0)
MCV: 80.5 fL (ref 80.0–100.0)
Platelets: 261 K/uL (ref 150–400)
RBC: 4.41 MIL/uL (ref 3.87–5.11)
RDW: 15 % (ref 11.5–15.5)
WBC: 11.5 K/uL — ABNORMAL HIGH (ref 4.0–10.5)
nRBC: 0 % (ref 0.0–0.2)

## 2024-09-24 LAB — COMPREHENSIVE METABOLIC PANEL WITH GFR
ALT: 35 U/L (ref 0–44)
AST: 28 U/L (ref 15–41)
Albumin: 3.6 g/dL (ref 3.5–5.0)
Alkaline Phosphatase: 210 U/L — ABNORMAL HIGH (ref 38–126)
Anion gap: 12 (ref 5–15)
BUN: 6 mg/dL (ref 6–20)
CO2: 20 mmol/L — ABNORMAL LOW (ref 22–32)
Calcium: 8.8 mg/dL — ABNORMAL LOW (ref 8.9–10.3)
Chloride: 105 mmol/L (ref 98–111)
Creatinine, Ser: 0.66 mg/dL (ref 0.44–1.00)
GFR, Estimated: >60 mL/min
Glucose, Bld: 93 mg/dL (ref 70–99)
Potassium: 3.7 mmol/L (ref 3.5–5.1)
Sodium: 137 mmol/L (ref 135–145)
Total Bilirubin: 0.2 mg/dL (ref 0.0–1.2)
Total Protein: 7.1 g/dL (ref 6.5–8.1)

## 2024-09-24 LAB — TYPE AND SCREEN
ABO/RH(D): O POS
Antibody Screen: NEGATIVE

## 2024-09-24 LAB — POCT FERN TEST
POCT Fern Test: POSITIVE
POCT Fern Test: POSITIVE

## 2024-09-24 MED ORDER — MISOPROSTOL 50MCG HALF TABLET: 50.0000 ug | ORAL_TABLET | Freq: Once | ORAL | Status: DC

## 2024-09-24 MED ORDER — LIDOCAINE HCL (PF) 1 % IJ SOLN
30.0000 mL | INTRAMUSCULAR | Status: AC | PRN
  Administered 2024-09-25: 30 mL via SUBCUTANEOUS
  Filled 2024-09-24: qty 30

## 2024-09-24 MED ORDER — LACTATED RINGERS IV SOLN: INTRAVENOUS | Status: DC

## 2024-09-24 MED ORDER — LACTATED RINGERS IV SOLN: 500.0000 mL | INTRAVENOUS | Status: DC | PRN

## 2024-09-24 MED ORDER — TERBUTALINE SULFATE 1 MG/ML IJ SOLN: 0.2500 mg | Freq: Once | INTRAMUSCULAR | Status: DC | PRN

## 2024-09-24 MED ORDER — MISOPROSTOL 25 MCG QUARTER TABLET: 25.0000 ug | ORAL_TABLET | Freq: Once | ORAL | Status: DC

## 2024-09-24 MED ORDER — ONDANSETRON HCL 4 MG/2ML IJ SOLN: 4.0000 mg | Freq: Four times a day (QID) | INTRAMUSCULAR | Status: DC | PRN

## 2024-09-24 MED ORDER — OXYTOCIN BOLUS FROM INFUSION
333.0000 mL | Freq: Once | INTRAVENOUS | Status: AC
  Administered 2024-09-25: 333 mL via INTRAVENOUS

## 2024-09-24 MED ORDER — ACETAMINOPHEN 325 MG PO TABS
650.0000 mg | ORAL_TABLET | ORAL | Status: DC | PRN
  Administered 2024-09-25: 650 mg via ORAL
  Filled 2024-09-24: qty 2

## 2024-09-24 MED ORDER — OXYTOCIN-SODIUM CHLORIDE 30-0.9 UT/500ML-% IV SOLN
2.5000 [IU]/h | INTRAVENOUS | Status: DC
  Filled 2024-09-24: qty 500

## 2024-09-24 MED ORDER — FENTANYL CITRATE (PF) 100 MCG/2ML IJ SOLN
100.0000 ug | INTRAMUSCULAR | Status: DC | PRN
  Administered 2024-09-24 – 2024-09-25 (×3): 100 ug via INTRAVENOUS
  Filled 2024-09-24 (×3): qty 2

## 2024-09-24 MED ORDER — SOD CITRATE-CITRIC ACID 500-334 MG/5ML PO SOLN: 30.0000 mL | ORAL | Status: DC | PRN
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
## 2024-09-24 NOTE — MAU Note (Signed)
 Pt says SROM at 945pm Denies HSV GBS- neg  [facility]- [facility]  Strong UC's since SROM

## 2024-09-25 ENCOUNTER — Encounter (HOSPITAL_COMMUNITY): Payer: Self-pay | Admitting: Obstetrics and Gynecology

## 2024-09-25 ENCOUNTER — Inpatient Hospital Stay (HOSPITAL_COMMUNITY)

## 2024-09-25 LAB — CBC
HCT: 32.6 % — ABNORMAL LOW (ref 36.0–46.0)
Hemoglobin: 10.3 g/dL — ABNORMAL LOW (ref 12.0–15.0)
MCH: 25.5 pg — ABNORMAL LOW (ref 26.0–34.0)
MCHC: 31.6 g/dL (ref 30.0–36.0)
MCV: 80.7 fL (ref 80.0–100.0)
Platelets: 241 10*3/uL (ref 150–400)
RBC: 4.04 MIL/uL (ref 3.87–5.11)
RDW: 15 % (ref 11.5–15.5)
WBC: 19.6 10*3/uL — ABNORMAL HIGH (ref 4.0–10.5)
nRBC: 0 % (ref 0.0–0.2)

## 2024-09-25 LAB — SYPHILIS: RPR W/REFLEX TO RPR TITER AND TREPONEMAL ANTIBODIES, TRADITIONAL SCREENING AND DIAGNOSIS ALGORITHM: RPR Ser Ql: NONREACTIVE

## 2024-09-25 MED ORDER — PRENATAL MULTIVITAMIN CH
1.0000 | ORAL_TABLET | Freq: Every day | ORAL | Status: DC
  Administered 2024-09-25 – 2024-09-26 (×2): 1 via ORAL
  Filled 2024-09-25 (×2): qty 1

## 2024-09-25 MED ORDER — ACETAMINOPHEN 325 MG PO TABS
650.0000 mg | ORAL_TABLET | ORAL | Status: DC | PRN
  Administered 2024-09-25: 650 mg via ORAL
  Filled 2024-09-25: qty 2

## 2024-09-25 MED ORDER — DIPHENHYDRAMINE HCL 25 MG PO CAPS: 25.0000 mg | ORAL_CAPSULE | Freq: Four times a day (QID) | ORAL | Status: DC | PRN

## 2024-09-25 MED ORDER — COCONUT OIL OIL: 1.0000 | TOPICAL_OIL | Status: DC | PRN

## 2024-09-25 MED ORDER — DIBUCAINE (PERIANAL) 1 % EX OINT: 1.0000 | TOPICAL_OINTMENT | CUTANEOUS | Status: DC | PRN

## 2024-09-25 MED ORDER — BENZOCAINE-MENTHOL 20-0.5 % EX AERO
1.0000 | INHALATION_SPRAY | CUTANEOUS | Status: DC | PRN
  Administered 2024-09-25: 1 via TOPICAL
  Filled 2024-09-25: qty 56

## 2024-09-25 MED ORDER — IBUPROFEN 600 MG PO TABS
600.0000 mg | ORAL_TABLET | Freq: Four times a day (QID) | ORAL | Status: DC
  Administered 2024-09-25 – 2024-09-26 (×6): 600 mg via ORAL
  Filled 2024-09-25 (×6): qty 1

## 2024-09-25 MED ORDER — SIMETHICONE 80 MG PO CHEW: 80.0000 mg | CHEWABLE_TABLET | ORAL | Status: DC | PRN

## 2024-09-25 MED ORDER — TETANUS-DIPHTH-ACELL PERTUSSIS 5-2-15.5 LF-MCG/0.5 IM SUSP: 0.5000 mL | Freq: Once | INTRAMUSCULAR | Status: DC

## 2024-09-25 MED ORDER — ONDANSETRON HCL 4 MG/2ML IJ SOLN: 4.0000 mg | INTRAMUSCULAR | Status: DC | PRN

## 2024-09-25 MED ORDER — MONTELUKAST SODIUM 10 MG PO TABS
10.0000 mg | ORAL_TABLET | Freq: Every day | ORAL | Status: DC
  Administered 2024-09-25 – 2024-09-26 (×2): 10 mg via ORAL
  Filled 2024-09-25 (×2): qty 1

## 2024-09-25 MED ORDER — WITCH HAZEL-GLYCERIN EX PADS: 1.0000 | MEDICATED_PAD | CUTANEOUS | Status: DC | PRN

## 2024-09-25 MED ORDER — ONDANSETRON HCL 4 MG PO TABS: 4.0000 mg | ORAL_TABLET | ORAL | Status: DC | PRN

## 2024-09-25 MED ORDER — SENNOSIDES-DOCUSATE SODIUM 8.6-50 MG PO TABS: 2.0000 | ORAL_TABLET | Freq: Every day | ORAL | Status: DC

## 2024-09-25 MED ORDER — ZOLPIDEM TARTRATE 5 MG PO TABS: 5.0000 mg | ORAL_TABLET | Freq: Every evening | ORAL | Status: DC | PRN

## 2024-09-25 MED ORDER — OXYCODONE HCL 5 MG PO TABS: 5.0000 mg | ORAL_TABLET | ORAL | Status: DC | PRN

## 2024-09-25 MED ORDER — IBUPROFEN 600 MG PO TABS
600.0000 mg | ORAL_TABLET | Freq: Once | ORAL | Status: AC
  Administered 2024-09-25: 600 mg via ORAL
  Filled 2024-09-25: qty 1

## 2024-09-25 NOTE — Discharge Summary (Signed)
 "   Postpartum Discharge Summary  Date of Service updated***     Patient Name: Gail Mathews DOB: [birth year] MRN: [id number]  Date of admission: 09/24/2024 Delivery date:09/25/2024 Delivering provider: WHITE, Surya Folden G Date of discharge: 09/25/2024  Admitting diagnosis: Chronic hypertension affecting pregnancy [O10.919] Intrauterine pregnancy: [redacted]w[redacted]d     Secondary diagnosis:  Principal Problem:   Chronic hypertension affecting pregnancy Active Problems:   Unspecified asthma, uncomplicated   SVD (spontaneous vaginal delivery)   Obstetric labial laceration, delivered, current hospitalization   GAD (generalized anxiety disorder)  Additional problems: ***    Discharge diagnosis: {DX.:23714}                                              Post partum procedures:{Postpartum procedures:23558} Augmentation: N/A Complications: None  Hospital course: Onset of Labor With Vaginal Delivery      34 y.o. yo G1P0000 at [redacted]w[redacted]d was admitted in Active Labor on 09/24/2024. Labor course was complicated by cHTN, asthma, alpha thalassemia silent carrier, obesity, gonorrhea (neg TOC 09/06/24)   Membrane Rupture Time/Date: 9:45 PM,09/24/2024  Delivery Method:Vaginal, Spontaneous Operative Delivery:N/A Episiotomy: None Lacerations:  Labial Patient had a postpartum course complicated by ***.  She is ambulating, tolerating a regular diet, passing flatus, and urinating well. Patient is discharged home in stable condition on 09/25/24.  Hospital course: {Courses:23701}  Magnesium Sulfate received: {Mag received:30440022} BMZ received: {BMZ received:30440023} Rhophylac:{Rhophylac received:30440032} FFM:{FFM:69559966} T-DaP:{Tdap:23962} Flu: {Qol:76036} RSV Vaccine received: {RSV:31013} Transfusion:{Transfusion received:30440034}  Immunizations received: Immunization History  Administered Date(s) Administered   Influenza Split 05/30/2011   PFIZER(Purple Top)SARS-COV-2 Vaccination 06/04/2020, 06/25/2020     Physical exam  Vitals:   09/25/24 0215 09/25/24 0310 09/25/24 0427 09/25/24 0844  BP: (!) 107/91 121/76 126/82 117/72  Pulse: (!) 121 84 84 87  Resp: 15 18 18 20   Temp:  98 F (36.7 C) 97.9 F (36.6 C) 97.8 F (36.6 C)  TempSrc:  Oral Oral Oral  SpO2:  98% 100%   Weight:      Height:       General: {Exam; general:21111117} Lochia: {Desc; appropriate/inappropriate:30686::appropriate} Uterine Fundus: {Desc; firm/soft:30687} Incision: {Exam; incision:21111123} DVT Evaluation: {Exam; dvt:2111122} Labs: Lab Results  Component Value Date   WBC 19.6 (H) 09/25/2024   HGB 10.3 (L) 09/25/2024   HCT 32.6 (L) 09/25/2024   MCV 80.7 09/25/2024   PLT 241 09/25/2024      Latest Ref Rng & Units 09/24/2024   10:20 PM  CMP  Glucose 70 - 99 mg/dL 93   BUN 6 - 20 mg/dL 6   Creatinine 9.55 - 8.99 mg/dL 9.33   Sodium 864 - 854 mmol/L 137   Potassium 3.5 - 5.1 mmol/L 3.7   Chloride 98 - 111 mmol/L 105   CO2 22 - 32 mmol/L 20   Calcium 8.9 - 10.3 mg/dL 8.8   Total Protein 6.5 - 8.1 g/dL 7.1   Total Bilirubin 0.0 - 1.2 mg/dL 0.2   Alkaline Phos 38 - 126 U/L 210   AST 15 - 41 U/L 28   ALT 0 - 44 U/L 35    Edinburgh Score:     No data to display         No data recorded  After visit meds:  Allergies as of 09/25/2024       Reactions   Other Swelling   Pecans/walnuts Pecans/walnuts   Amoxicillin Rash  Med Rec must be completed prior to using this [facility]***        Discharge home in stable condition Infant Feeding: {Baby feeding:23562} Infant Disposition:{CHL IP OB HOME WITH FNUYZM:76418} Discharge instruction: per After Visit Summary and Postpartum booklet. Activity: Advance as tolerated. Pelvic rest for 6 weeks.  Diet: {OB ipzu:78888878} Future Appointments:No future appointments. Follow up Visit:   Please schedule this patient for a In person postpartum visit in 6 weeks with the following provider: Any provider. Additional Postpartum F/U:BP check 1  week  High risk pregnancy complicated by: HTN Delivery mode:  Vaginal, Spontaneous Anticipated Birth Control:  Depo   09/25/2024 Coleta KANDICE Pizza, CNM    "

## 2024-09-25 NOTE — Lactation Note (Signed)
 This note was copied from a baby's chart. Lactation Consultation Note  Patient Name: Gail Mathews Unijb'd Date: 09/25/2024 Age:34 Reason for consult: Initial assessment;Primapara;1st time breastfeeding;Term  P1- [name]'s feeding plan is to offer both breast and formula. [name] reports that infant latched well once, but she has not latched him again. LC encouraged [name] to always offer the breast first, then offer formula to ensure proper breast stimulation. [name] verbalized understanding and denies having any questions or concerns.  LC reviewed the first 24 hr birthday nap, day 2 cluster feeding, feeding infant on cue 8-12x in 24 hrs, not allowing infant to go over 3 hrs without a feeding, CDC milk storage guidelines, LC services handout and engorgement/breast care. LC encouraged [name] to call for further assistance as needed.  Maternal Data Has patient been taught Hand Expression?: No Does the patient have breastfeeding experience prior to this delivery?: No  Feeding Mother's Current Feeding Choice: Breast Milk and Formula Nipple Type: Slow - flow  Lactation Tools Discussed/Used Pump Education: Milk Storage  Interventions Interventions: Breast feeding basics reviewed;Education;LC Services brochure  Discharge Discharge Education: Engorgement and breast care;Warning signs for feeding baby Pump: Referral sent for [facility] Pump  Consult Status Consult Status: Follow-up Date: 09/26/24 Follow-up type: In-patient    Recardo Hoit BS, IBCLC 09/25/2024, 7:49 PM

## 2024-09-25 NOTE — Plan of Care (Signed)
" °  Problem: Life Cycle: Goal: Chance of risk for complications during the postpartum period will decrease Outcome: Completed/Met   "

## 2024-09-26 MED ORDER — SIMETHICONE 80 MG PO CHEW: 80.0000 mg | CHEWABLE_TABLET | ORAL | Status: AC | PRN

## 2024-09-26 MED ORDER — IBUPROFEN 600 MG PO TABS: 600.0000 mg | ORAL_TABLET | Freq: Four times a day (QID) | ORAL | Status: AC | PRN

## 2024-09-26 NOTE — Progress Notes (Signed)
 Circumcision Consent   Discussed with mom at bedside about circumcision.    Circumcision is a surgery that removes the skin that covers the tip of the penis, called the foreskin. Circumcision is usually done when a boy is between 26 and 85 days old, sometimes up to 44-47 weeks old.   The most common reasons boys are circumcised include for cultural/religious beliefs or for parental preference (potentially easier to clean, so baby looks like daddy, etc).   There may be some medical benefits for circumcision:    Circumcised boys seem to have slightly lower rates of: ? Urinary tract infections (per the American Academy of Pediatrics an uncircumcised boy has a 1/100 chance of developing a UTI in the first year of life, a circumcised boy at a 09/998 chance of developing a UTI in the first year of life- a 10% reduction) ? Penis cancer (typically rare- an uncircumcised female has a 1 in 100,000 chance of developing cancer of the penis) ? Sexually transmitted infection (in endemic areas, including HIV, HPV and Herpes- circumcision does NOT protect against gonorrhea, chlamydia, trachomatis, or syphilis) ? Phimosis: a condition where that makes retraction of the foreskin over the glans impossible (0.4 per 1000 boys per year or 0.6% of boys are affected by their 15th birthday)   Boys and men who are not circumcised can reduce these extra risks by: ? Cleaning their penis well ? Using condoms during sex   What are the risks of circumcision?   As with any surgical procedure, there are risks and complications. In circumcision, complications are rare and usually minor, the most common being: ? Bleeding- risk is reduced by holding each clamp for 30 seconds prior to a cut being made, and by holding pressure after the procedure is done ? Infection- the penis is cleaned prior to the procedure, and the procedure is done under sterile technique ? Damage to the urethra or amputation of the penis   All questions  were answered and mother consented.  Circumcision Consent   Discussed with mom at bedside about circumcision.    Circumcision is a surgery that removes the skin that covers the tip of the penis, called the foreskin. Circumcision is usually done when a boy is between 22 and 67 days old, sometimes up to 76-80 weeks old.   The most common reasons boys are circumcised include for cultural/religious beliefs or for parental preference (potentially easier to clean, so baby looks like daddy, etc).   There may be some medical benefits for circumcision:    Circumcised boys seem to have slightly lower rates of: ? Urinary tract infections (per the American Academy of Pediatrics an uncircumcised boy has a 1/100 chance of developing a UTI in the first year of life, a circumcised boy at a 09/998 chance of developing a UTI in the first year of life- a 10% reduction) ? Penis cancer (typically rare- an uncircumcised female has a 1 in 100,000 chance of developing cancer of the penis) ? Sexually transmitted infection (in endemic areas, including HIV, HPV and Herpes- circumcision does NOT protect against gonorrhea, chlamydia, trachomatis, or syphilis) ? Phimosis: a condition where that makes retraction of the foreskin over the glans impossible (0.4 per 1000 boys per year or 0.6% of boys are affected by their 15th birthday)   Boys and men who are not circumcised can reduce these extra risks by: ? Cleaning their penis well ? Using condoms during sex   What are the risks of circumcision?  As with any surgical procedure, there are risks and complications. In circumcision, complications are rare and usually minor, the most common being: ? Bleeding- risk is reduced by holding each clamp for 30 seconds prior to a cut being made, and by holding pressure after the procedure is done ? Infection- the penis is cleaned prior to the procedure, and the procedure is done under sterile technique ? Damage to the urethra or  amputation of the penis   How is circumcision done in baby boys?   The baby will be placed on a special table and the legs restrained for their safety. Numbing medication is injected into the penis, and the skin is cleansed with betadine to decrease the risk of infection.    What to expect:   The penis will look red and raw for 5-7 days as it heals. We expect scabbing around where the cut was made, as well as clear-pink fluid and some swelling of the penis right after the procedure. If your baby's circumcision starts to bleed or develops pus, please contact your pediatrician immediately.   All questions were answered and mother consented.  Bebe Izell Raddle MD [facility] [employer] (Faculty Practice) 09/26/2024 Time: [phone]

## 2024-09-26 NOTE — Progress Notes (Signed)
 error

## 2024-09-26 NOTE — Lactation Note (Signed)
 This note was copied from a baby's chart. Lactation Consultation Note  Patient Name: Gail Mathews Date: 09/26/2024 Age:32 hours Reason for consult: Follow-up assessment;Primapara;1st time breastfeeding;Term;Infant weight loss (early D/C) Per mom plans to work on the latching, and bottle feeding at home.  LC reviewed supply and demand, importance of giving the opportunity to latch, if to fussy, feed the baby some EBM or formula and then latch.  LC reviewed engorgement prevention and tx.  LC provided a hand pump.  The Stork pump was not delivered prior to Early D/C.  Mom aware if approved will be sent to her home.   Maternal Data    Feeding Mother's Current Feeding Choice: Breast Milk and Formula  LATCH Score - all bottles     Lactation Tools Discussed/Used Tools: Pump Breast pump type: Manual Pump Education: Setup, frequency, and cleaning;Milk Storage Reason for Pumping: PRN or to use prior to latching to prime the milk ducts  Interventions Interventions: Breast feeding basics reviewed;Education;Hand pump;LC Services brochure;CDC milk storage guidelines;CDC Guidelines for Breast Pump Cleaning  Discharge Discharge Education: Engorgement and breast care;Warning signs for feeding baby Pump:  (D/C prior to [place] being delivered if approved. Mom aware if approved will be sent to her home)  Consult Status Consult Status: Complete Date: 09/26/24    Rollene Jenkins Fiedler 09/26/2024, 12:50 PM

## 2024-09-30 ENCOUNTER — Inpatient Hospital Stay (HOSPITAL_COMMUNITY): Admission: RE | Admit: 2024-09-30 | Admitting: Obstetrics and Gynecology

## 2024-10-04 ENCOUNTER — Ambulatory Visit: Payer: Self-pay

## 2024-10-04 ENCOUNTER — Encounter: Payer: Self-pay | Admitting: Obstetrics and Gynecology

## 2024-10-05 ENCOUNTER — Encounter: Payer: Self-pay | Admitting: Physician Assistant

## 2024-10-07 ENCOUNTER — Ambulatory Visit

## 2024-10-07 ENCOUNTER — Telehealth: Payer: Self-pay | Admitting: Physician Assistant

## 2024-10-07 VITALS — BP 119/77 | HR 106 | Wt 200.2 lb

## 2024-10-07 DIAGNOSIS — Z0130 Encounter for examination of blood pressure without abnormal findings: Secondary | ICD-10-CM

## 2024-10-07 DIAGNOSIS — J4541 Moderate persistent asthma with (acute) exacerbation: Secondary | ICD-10-CM | POA: Diagnosis not present

## 2024-10-07 DIAGNOSIS — J301 Allergic rhinitis due to pollen: Secondary | ICD-10-CM

## 2024-10-07 MED ORDER — ALBUTEROL SULFATE HFA 108 (90 BASE) MCG/ACT IN AERS: INHALATION_SPRAY | RESPIRATORY_TRACT | 0 refills | Status: AC

## 2024-10-07 MED ORDER — MONTELUKAST SODIUM 10 MG PO TABS: 10.0000 mg | ORAL_TABLET | Freq: Every day | ORAL | 0 refills | Status: AC

## 2024-10-07 NOTE — Patient Instructions (Signed)
" °  [name], thank you for joining Elsie Velma Lunger, PA-C for today's virtual visit.  While this provider is not your primary care provider (PCP), if your PCP is located in our provider database this encounter information will be shared with them immediately following your visit.   A [facility] MyChart account gives you access to today's visit and all your visits, tests, and labs performed at [facility]  click here if you don't have a [facility] MyChart account or go to [email or web address]  Consent: (Patient) [name] provided verbal consent for this virtual visit at the beginning of the encounter.  Current Medications:  Current Outpatient Medications:    albuterol  (VENTOLIN  HFA) 108 (90 Base) MCG/ACT inhaler, INHALE 1-2 PUFFS INTO THE LUNGS EVERY 4 HOURS AS NEEDED FOR WHEEZING OR SHORTNESS OF BREATH., Disp: 20.1 each, Rfl: 0   Blood Pressure Monitoring (BLOOD PRESSURE KIT) DEVI, 1 Device by Does not apply route once a week., Disp: 1 each, Rfl: 0   ibuprofen  (ADVIL ) 600 MG tablet, Take 1 tablet (600 mg total) by mouth every 6 (six) hours as needed., Disp: , Rfl:    montelukast  (SINGULAIR ) 10 MG tablet, Take 1 tablet (10 mg total) by mouth daily., Disp: 90 tablet, Rfl: 0   Prenatal Vit-Fe Phos-FA-Omega (VITAFOL  GUMMIES) 3.33-0.333-34.8 MG CHEW, Chew 1 tablet by mouth daily., Disp: 90 tablet, Rfl: 5   simethicone  (MYLICON) 80 MG chewable tablet, Chew 1 tablet (80 mg total) by mouth as needed for flatulence., Disp: , Rfl:    Medications ordered in this encounter:  No orders of the defined types were placed in this encounter.    *If you need refills on other medications prior to your next appointment, please contact your pharmacy*  Follow-Up: Call back or seek an in-person evaluation if the symptoms worsen or if the condition fails to improve as anticipated.     Other Instructions   If you have been instructed to have an in-person evaluation today at  a local Urgent Care facility, please use the link below. It will take you to a list of all of our available [facility], including address, phone number and hours of operation. Please do not delay care.  [facility]  If you or a family member do not have a primary care provider, use the link below to schedule a visit and establish care. When you choose a [facility] primary care physician or advanced practice provider, you gain a long-term partner in health. Find a Primary Care Provider  Learn more about [facility]'s in-office and virtual care options: [facility] - Get Care Now  "

## 2024-10-07 NOTE — Progress Notes (Signed)
..  Subjective:  Gail Mathews is a 34 y.o. postpartum female here for BP check. She is not taking any BP medications, and denies any abnormal symptoms today.  Hypertension ROS: no TIA's, no chest pain on exertion, no dyspnea on exertion, and no swelling of ankles.    Objective:  BP 119/77   Pulse (!) 106   Wt 200 lb 3.2 oz (90.8 kg)   LMP 01/05/2024   Breastfeeding Yes   BMI 29.56 kg/m   Appearance alert, well appearing, and in no distress. General exam BP noted to be well controlled today in office.    Assessment:   Blood Pressure well controlled.   Plan:  Current treatment plan is effective, no change in therapy. Advised of abnormal symptoms and to report to mau if they occur. PP visit is scheduled for 11/04/24 .
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
## 2024-11-04 ENCOUNTER — Ambulatory Visit: Payer: Self-pay | Admitting: Physician Assistant
